# Patient Record
Sex: Female | Born: 1955
Health system: Southern US, Community
[De-identification: ages and names within clinical notes are randomized; demographics above are authoritative.]

## PROBLEM LIST (undated history)

## (undated) DIAGNOSIS — H269 Unspecified cataract: Secondary | ICD-10-CM

## (undated) DIAGNOSIS — G2581 Restless legs syndrome: Secondary | ICD-10-CM

## (undated) DIAGNOSIS — D649 Anemia, unspecified: Secondary | ICD-10-CM

## (undated) DIAGNOSIS — IMO0001 Reserved for inherently not codable concepts without codable children: Secondary | ICD-10-CM

## (undated) DIAGNOSIS — Z905 Acquired absence of kidney: Secondary | ICD-10-CM

## (undated) DIAGNOSIS — E785 Hyperlipidemia, unspecified: Secondary | ICD-10-CM

## (undated) DIAGNOSIS — T7840XA Allergy, unspecified, initial encounter: Secondary | ICD-10-CM

## (undated) HISTORY — DX: Unspecified cataract: H26.9

## (undated) HISTORY — DX: Allergy, unspecified, initial encounter: T78.40XA

## (undated) HISTORY — DX: Hyperlipidemia, unspecified: E78.5

## (undated) HISTORY — DX: Acquired absence of kidney: Z90.5

## (undated) HISTORY — DX: Reserved for inherently not codable concepts without codable children: IMO0001

## (undated) HISTORY — DX: Restless legs syndrome: G25.81

## (undated) HISTORY — DX: Anemia, unspecified: D64.9

---

## 1973-11-14 HISTORY — PX: NEPHRECTOMY: SHX65

## 2000-03-28 ENCOUNTER — Other Ambulatory Visit: Admission: RE | Admit: 2000-03-28 | Discharge: 2000-03-28 | Payer: Self-pay | Admitting: Obstetrics and Gynecology

## 2000-04-06 ENCOUNTER — Encounter (INDEPENDENT_AMBULATORY_CARE_PROVIDER_SITE_OTHER): Payer: Self-pay | Admitting: Specialist

## 2000-04-06 ENCOUNTER — Other Ambulatory Visit: Admission: RE | Admit: 2000-04-06 | Discharge: 2000-04-06 | Payer: Self-pay | Admitting: Obstetrics and Gynecology

## 2000-08-04 ENCOUNTER — Encounter: Payer: Self-pay | Admitting: Obstetrics and Gynecology

## 2000-08-04 ENCOUNTER — Encounter: Admission: RE | Admit: 2000-08-04 | Discharge: 2000-08-04 | Payer: Self-pay | Admitting: Obstetrics and Gynecology

## 2001-07-09 ENCOUNTER — Other Ambulatory Visit: Admission: RE | Admit: 2001-07-09 | Discharge: 2001-07-09 | Payer: Self-pay | Admitting: Obstetrics and Gynecology

## 2001-08-08 ENCOUNTER — Encounter: Payer: Self-pay | Admitting: Obstetrics and Gynecology

## 2001-08-08 ENCOUNTER — Encounter: Admission: RE | Admit: 2001-08-08 | Discharge: 2001-08-08 | Payer: Self-pay | Admitting: Obstetrics and Gynecology

## 2001-08-13 ENCOUNTER — Ambulatory Visit (HOSPITAL_COMMUNITY): Admission: RE | Admit: 2001-08-13 | Discharge: 2001-08-13 | Payer: Self-pay | Admitting: Gastroenterology

## 2001-08-13 ENCOUNTER — Encounter (INDEPENDENT_AMBULATORY_CARE_PROVIDER_SITE_OTHER): Payer: Self-pay | Admitting: *Deleted

## 2001-08-20 ENCOUNTER — Encounter: Payer: Self-pay | Admitting: Gastroenterology

## 2001-08-20 ENCOUNTER — Encounter: Admission: RE | Admit: 2001-08-20 | Discharge: 2001-08-20 | Payer: Self-pay | Admitting: Gastroenterology

## 2002-07-24 ENCOUNTER — Other Ambulatory Visit: Admission: RE | Admit: 2002-07-24 | Discharge: 2002-07-24 | Payer: Self-pay | Admitting: Obstetrics and Gynecology

## 2002-08-12 ENCOUNTER — Encounter: Payer: Self-pay | Admitting: Obstetrics and Gynecology

## 2002-08-12 ENCOUNTER — Encounter: Admission: RE | Admit: 2002-08-12 | Discharge: 2002-08-12 | Payer: Self-pay | Admitting: Obstetrics and Gynecology

## 2003-08-14 ENCOUNTER — Encounter: Admission: RE | Admit: 2003-08-14 | Discharge: 2003-08-14 | Payer: Self-pay | Admitting: Obstetrics and Gynecology

## 2003-08-14 ENCOUNTER — Encounter: Payer: Self-pay | Admitting: Obstetrics and Gynecology

## 2003-09-02 ENCOUNTER — Other Ambulatory Visit: Admission: RE | Admit: 2003-09-02 | Discharge: 2003-09-02 | Payer: Self-pay | Admitting: Obstetrics and Gynecology

## 2004-10-06 ENCOUNTER — Ambulatory Visit: Payer: Self-pay | Admitting: Family Medicine

## 2004-11-09 ENCOUNTER — Ambulatory Visit: Payer: Self-pay | Admitting: Family Medicine

## 2004-12-02 ENCOUNTER — Encounter: Admission: RE | Admit: 2004-12-02 | Discharge: 2004-12-02 | Payer: Self-pay | Admitting: Family Medicine

## 2004-12-03 ENCOUNTER — Other Ambulatory Visit: Admission: RE | Admit: 2004-12-03 | Discharge: 2004-12-03 | Payer: Self-pay | Admitting: Obstetrics and Gynecology

## 2006-01-09 ENCOUNTER — Ambulatory Visit: Payer: Self-pay | Admitting: Family Medicine

## 2006-01-09 ENCOUNTER — Encounter: Admission: RE | Admit: 2006-01-09 | Discharge: 2006-01-09 | Payer: Self-pay | Admitting: Family Medicine

## 2006-01-17 ENCOUNTER — Ambulatory Visit: Payer: Self-pay | Admitting: Family Medicine

## 2006-05-04 ENCOUNTER — Other Ambulatory Visit: Admission: RE | Admit: 2006-05-04 | Discharge: 2006-05-04 | Payer: Self-pay | Admitting: Obstetrics and Gynecology

## 2006-10-02 ENCOUNTER — Ambulatory Visit: Payer: Self-pay | Admitting: Family Medicine

## 2006-12-12 ENCOUNTER — Ambulatory Visit: Payer: Self-pay | Admitting: Family Medicine

## 2007-01-15 ENCOUNTER — Ambulatory Visit: Payer: Self-pay | Admitting: Family Medicine

## 2007-01-15 LAB — CONVERTED CEMR LAB
Alkaline Phosphatase: 85 units/L (ref 39–117)
BUN: 13 mg/dL (ref 6–23)
Basophils Relative: 0.6 % (ref 0.0–1.0)
Bilirubin, Direct: 0.1 mg/dL (ref 0.0–0.3)
CO2: 32 meq/L (ref 19–32)
Cholesterol: 202 mg/dL (ref 0–200)
Direct LDL: 119.2 mg/dL
GFR calc Af Amer: 85 mL/min
Glucose, Bld: 95 mg/dL (ref 70–99)
Hemoglobin: 13.4 g/dL (ref 12.0–15.0)
Lymphocytes Relative: 31.3 % (ref 12.0–46.0)
Monocytes Absolute: 0.6 10*3/uL (ref 0.2–0.7)
Monocytes Relative: 11.9 % — ABNORMAL HIGH (ref 3.0–11.0)
Neutro Abs: 2.4 10*3/uL (ref 1.4–7.7)
Potassium: 4.4 meq/L (ref 3.5–5.1)
Sodium: 141 meq/L (ref 135–145)
TSH: 1.8 microintl units/mL (ref 0.35–5.50)
Total Protein: 6.9 g/dL (ref 6.0–8.3)
VLDL: 19 mg/dL (ref 0–40)

## 2007-01-23 ENCOUNTER — Ambulatory Visit: Payer: Self-pay | Admitting: Family Medicine

## 2007-01-23 ENCOUNTER — Encounter: Admission: RE | Admit: 2007-01-23 | Discharge: 2007-01-23 | Payer: Self-pay | Admitting: Family Medicine

## 2007-09-11 DIAGNOSIS — J309 Allergic rhinitis, unspecified: Secondary | ICD-10-CM | POA: Insufficient documentation

## 2007-09-11 DIAGNOSIS — E785 Hyperlipidemia, unspecified: Secondary | ICD-10-CM

## 2007-12-31 DIAGNOSIS — J069 Acute upper respiratory infection, unspecified: Secondary | ICD-10-CM | POA: Insufficient documentation

## 2008-01-04 ENCOUNTER — Ambulatory Visit: Payer: Self-pay | Admitting: Family Medicine

## 2008-01-04 ENCOUNTER — Telehealth: Payer: Self-pay | Admitting: Family Medicine

## 2008-01-04 LAB — CONVERTED CEMR LAB: Rapid Strep: NEGATIVE

## 2008-02-14 ENCOUNTER — Ambulatory Visit: Payer: Self-pay | Admitting: Family Medicine

## 2008-02-14 ENCOUNTER — Encounter: Admission: RE | Admit: 2008-02-14 | Discharge: 2008-02-14 | Payer: Self-pay | Admitting: Obstetrics and Gynecology

## 2008-02-14 LAB — CONVERTED CEMR LAB
ALT: 22 units/L (ref 0–35)
Albumin: 3.8 g/dL (ref 3.5–5.2)
Alkaline Phosphatase: 77 units/L (ref 39–117)
BUN: 14 mg/dL (ref 6–23)
CO2: 33 meq/L — ABNORMAL HIGH (ref 19–32)
Eosinophils Relative: 3.9 % (ref 0.0–5.0)
GFR calc Af Amer: 75 mL/min
Glucose, Bld: 97 mg/dL (ref 70–99)
HCT: 40.6 % (ref 36.0–46.0)
Hemoglobin: 13.1 g/dL (ref 12.0–15.0)
Lymphocytes Relative: 32 % (ref 12.0–46.0)
Monocytes Absolute: 0.5 10*3/uL (ref 0.1–1.0)
Monocytes Relative: 10.1 % (ref 3.0–12.0)
Neutro Abs: 2.7 10*3/uL (ref 1.4–7.7)
Nitrite: NEGATIVE
Platelets: 230 10*3/uL (ref 150–400)
Potassium: 4.5 meq/L (ref 3.5–5.1)
Specific Gravity, Urine: 1.02
Total CHOL/HDL Ratio: 2.9
Total Protein: 6.9 g/dL (ref 6.0–8.3)
WBC Urine, dipstick: NEGATIVE
WBC: 5 10*3/uL (ref 4.5–10.5)

## 2008-02-28 ENCOUNTER — Ambulatory Visit: Payer: Self-pay | Admitting: Family Medicine

## 2008-08-18 ENCOUNTER — Telehealth: Payer: Self-pay | Admitting: Family Medicine

## 2009-01-05 ENCOUNTER — Telehealth: Payer: Self-pay | Admitting: Family Medicine

## 2009-02-11 ENCOUNTER — Ambulatory Visit: Payer: Self-pay | Admitting: Family Medicine

## 2009-02-11 LAB — CONVERTED CEMR LAB
ALT: 41 units/L — ABNORMAL HIGH (ref 0–35)
Albumin: 3.8 g/dL (ref 3.5–5.2)
Basophils Relative: 0.3 % (ref 0.0–3.0)
Bilirubin, Direct: 0.1 mg/dL (ref 0.0–0.3)
CO2: 35 meq/L — ABNORMAL HIGH (ref 19–32)
Chloride: 105 meq/L (ref 96–112)
Creatinine, Ser: 1 mg/dL (ref 0.4–1.2)
Eosinophils Absolute: 0.2 10*3/uL (ref 0.0–0.7)
Eosinophils Relative: 2.9 % (ref 0.0–5.0)
HCT: 40.8 % (ref 36.0–46.0)
Hemoglobin: 13.9 g/dL (ref 12.0–15.0)
LDL Cholesterol: 119 mg/dL — ABNORMAL HIGH (ref 0–99)
MCHC: 34 g/dL (ref 30.0–36.0)
MCV: 91.5 fL (ref 78.0–100.0)
Monocytes Absolute: 0.5 10*3/uL (ref 0.1–1.0)
Neutro Abs: 2.9 10*3/uL (ref 1.4–7.7)
Potassium: 4.5 meq/L (ref 3.5–5.1)
RBC: 4.46 M/uL (ref 3.87–5.11)
Sodium: 144 meq/L (ref 135–145)
Total CHOL/HDL Ratio: 3
Total Protein: 6.8 g/dL (ref 6.0–8.3)
Triglycerides: 80 mg/dL (ref 0.0–149.0)
WBC: 5.3 10*3/uL (ref 4.5–10.5)

## 2009-03-02 ENCOUNTER — Ambulatory Visit: Payer: Self-pay | Admitting: Family Medicine

## 2009-03-02 ENCOUNTER — Encounter: Admission: RE | Admit: 2009-03-02 | Discharge: 2009-03-02 | Payer: Self-pay | Admitting: Family Medicine

## 2009-04-27 ENCOUNTER — Telehealth: Payer: Self-pay | Admitting: Family Medicine

## 2009-12-31 ENCOUNTER — Telehealth: Payer: Self-pay | Admitting: Family Medicine

## 2010-03-03 ENCOUNTER — Ambulatory Visit: Payer: Self-pay | Admitting: Family Medicine

## 2010-03-03 LAB — CONVERTED CEMR LAB
ALT: 24 units/L (ref 0–35)
AST: 33 units/L (ref 0–37)
Albumin: 4.2 g/dL (ref 3.5–5.2)
Basophils Relative: 0.6 % (ref 0.0–3.0)
Bilirubin Urine: NEGATIVE
Chloride: 102 meq/L (ref 96–112)
Cholesterol: 152 mg/dL (ref 0–200)
Eosinophils Relative: 2.4 % (ref 0.0–5.0)
GFR calc non Af Amer: 54.68 mL/min (ref >60)
Glucose, Urine, Semiquant: NEGATIVE
HCT: 38.8 % (ref 36.0–46.0)
Hemoglobin: 13.1 g/dL (ref 12.0–15.0)
LDL Cholesterol: 72 mg/dL (ref 0–99)
Lymphs Abs: 1.5 10*3/uL (ref 0.7–4.0)
MCV: 91.5 fL (ref 78.0–100.0)
Monocytes Absolute: 0.6 10*3/uL (ref 0.1–1.0)
Monocytes Relative: 12.5 % — ABNORMAL HIGH (ref 3.0–12.0)
Neutro Abs: 2.3 10*3/uL (ref 1.4–7.7)
Potassium: 5 meq/L (ref 3.5–5.1)
Protein, U semiquant: NEGATIVE
Sodium: 142 meq/L (ref 135–145)
TSH: 1.4 microintl units/mL (ref 0.35–5.50)
Total Protein: 7.6 g/dL (ref 6.0–8.3)
Urobilinogen, UA: 0.2
VLDL: 9.8 mg/dL (ref 0.0–40.0)
WBC Urine, dipstick: NEGATIVE
WBC: 4.5 10*3/uL (ref 4.5–10.5)
pH: 6.5

## 2010-03-11 ENCOUNTER — Encounter: Admission: RE | Admit: 2010-03-11 | Discharge: 2010-03-11 | Payer: Self-pay | Admitting: Family Medicine

## 2010-03-26 ENCOUNTER — Ambulatory Visit: Payer: Self-pay | Admitting: Family Medicine

## 2010-12-14 NOTE — Progress Notes (Signed)
Summary: rx sent to [facility]  Phone Note From Pharmacy   Summary of Call: patient would like refills sent to [facility] Initial call taken by: Kern Reap CMA Duncan Dull),  December 31, 2009 3:56 PM    Prescriptions: CYANOCOBALAMIN 1000 MCG/ML INJ SOLN (CYANOCOBALAMIN) inject 1cc monthly IM  #30cc x 3   Entered by:   Kern Reap CMA (AAMA)   Authorized by:   Roderick Pee MD   Signed by:   Kern Reap CMA (AAMA) on 12/31/2009   Method used:   Electronically to        [facility] (retail)       [address]       [place], [place]  [id number]       Ph: [phone]       Fax: [phone]   RxID:   [id number]

## 2010-12-14 NOTE — Assessment & Plan Note (Signed)
Summary: CPX // RS   Vital Signs:  Patient profile:   55 year old female Height:      63.5 inches Weight:      136 pounds BMI:     23.80 Temp:     97.6 degrees F oral BP sitting:   110 / 80  (right arm) CC: Cpx   CC:  Cpx.  History of Present Illness: Brooke Hansen is a 55 year old, married female, nonsmoker, who comes in today for general physical exam and evaluation of allergic rhinitis.  Vitamin B12 deficiency, and hyperlipidemia.  Her allergic rhinitis is treated with Flonase nasal spray and 10 mg of Zyrtec nightly on the prematurely.  Basis.  Hyperlipidemia.  Is treated with Zocor 40 mg nightly lipids are at goal.  She has vitamin B12 deficiency and takes 1 cc of B12 monthly.  New problems are some spots on her hands and tennis elbow.  She does routine eye exams, dental, BSE monthly, annual mammography, colonoscopy, normal about 8 years ago in GI, have the GYN, tetanus, 2004, seasonal flu 2010.  she rarely has to use the albuterol  Allergies: 1)  ! * Cillins 2)  Amoxicillin (Amoxicillin) 3)  * Ceptra  Past History:  Past medical, surgical, family and social histories (including risk factors) reviewed, and no changes noted (except as noted below).  Past Medical History: Reviewed history from 02/28/2008 and no changes required. RLS Allergic rhinitis Anemia-NOS Asthma Blood transfusion Hyperlipidemia childbirth x 1 C-section right nephrectomy for nonfunctioning kidney  Past Surgical History: Reviewed history from 09/11/2007 and no changes required. Caesarean section Nephrectomy  Family History: Reviewed history from 09/11/2007 and no changes required. Family History of Alcoholism/Addiction Family History Diabetes 1st degree relative Family History High cholesterol Family History of Cardiovascular disorder  Social History: Reviewed history from 01/04/2008 and no changes required. Occupation:school system Married Former Smoker Alcohol use-yes Drug  use-no Regular exercise-no  Review of Systems      See HPI  Physical Exam  General:  Well-developed,well-nourished,in no acute distress; alert,appropriate and cooperative throughout examination Head:  Normocephalic and atraumatic without obvious abnormalities. No apparent alopecia or balding. Eyes:  No corneal or conjunctival inflammation noted. EOMI. Perrla. Funduscopic exam benign, without hemorrhages, exudates or papilledema. Vision grossly normal. Ears:  External ear exam shows no significant lesions or deformities.  Otoscopic examination reveals clear canals, tympanic membranes are intact bilaterally without bulging, retraction, inflammation or discharge. Hearing is grossly normal bilaterally. Nose:  External nasal examination shows no deformity or inflammation. Nasal mucosa are pink and moist without lesions or exudates. Mouth:  Oral mucosa and oropharynx without lesions or exudates.  Teeth in good repair. Neck:  No deformities, masses, or tenderness noted. Chest Wall:  is some asymmetry with the left ribs, being more prominent than the right Breasts:  No mass, nodules, thickening, tenderness, bulging, retraction, inflamation, nipple discharge or skin changes noted.   Lungs:  Normal respiratory effort, chest expands symmetrically. Lungs are clear to auscultation, no crackles or wheezes. Heart:  Normal rate and regular rhythm. S1 and S2 normal without gallop, murmur, click, rub or other extra sounds. Abdomen:  Bowel sounds positive,abdomen soft and non-tender without masses, organomegaly or hernias noted. Msk:  No deformity or scoliosis noted of thoracic or lumbar spine.   Pulses:  R and L carotid,radial,femoral,dorsalis pedis and posterior tibial pulses are full and equal bilaterally Extremities:  No clubbing, cyanosis, edema, or deformity noted with normal full range of motion of all joints.   Neurologic:  No  cranial nerve deficits noted. Station and gait are normal. Plantar reflexes  are down-going bilaterally. DTRs are symmetrical throughout. Sensory, motor and coordinative functions appear intact. Skin:  Intact without suspicious lesions or rashes Cervical Nodes:  No lymphadenopathy noted Axillary Nodes:  No palpable lymphadenopathy Inguinal Nodes:  No significant adenopathy Psych:  Cognition and judgment appear intact. Alert and cooperative with normal attention span and concentration. No apparent delusions, illusions, hallucinations   Impression & Recommendations:  Problem # 1:  PHYSICAL EXAMINATION (ICD-V70.0) Assessment Unchanged  Orders: Prescription Created Electronically [phone]) EKG w/ Interpretation (93000)  Problem # 2:  HYPERLIPIDEMIA (ICD-272.4) Assessment: Improved  Her updated medication list for this problem includes:    Zocor 40 Mg Tabs (Simvastatin) .Marland Kitchen... 1 tab @ bedtime  Orders: Prescription Created Electronically [phone])  Problem # 3:  ASTHMA (ICD-493.90) Assessment: Improved  The following medications were removed from the medication list:    Albuterol 90 Mcg/act Aers (Albuterol) .Marland Kitchen... As needed Her updated medication list for this problem includes:    Proventil Hfa 108 (90 Base) Mcg/act Aers (Albuterol sulfate) ..... Inhale 2 puff as directed four times a day as needed  Orders: Prescription Created Electronically [phone])  Problem # 4:  ANEMIA-NOS (ICD-285.9) Assessment: Improved  Her updated medication list for this problem includes:    Cyanocobalamin 1000 Mcg/ml Inj Soln (Cyanocobalamin) ..... Inject 1cc monthly im  Orders: Prescription Created Electronically [phone])  Complete Medication List: 1)  Flonase 50 Mcg/act Susp (Fluticasone propionate) .... Spray 1 spray into both nostrils once  a day 2)  Proventil Hfa 108 (90 Base) Mcg/act Aers (Albuterol sulfate) .... Inhale 2 puff as directed four times a day as needed 3)  Cyanocobalamin 1000 Mcg/ml Inj Soln (Cyanocobalamin) .... Inject 1cc monthly im 4)  Zocor 40 Mg Tabs  (Simvastatin) .Marland Kitchen.. 1 tab @ bedtime  Patient Instructions: 1)  Please schedule a follow-up appointment in 1 year. 2)  Schedule your mammogram. 3)  Schedule a colonoscopy/sigmoidoscopy to help detect colon cancer. 4)  Take calcium +Vitamin D daily. 5)  Take an Aspirin every day. Prescriptions: ZOCOR 40 MG  TABS (SIMVASTATIN) 1 tab @ bedtime  #100 x 3   Entered and Authorized by:   Roderick Pee MD   Signed by:   Roderick Pee MD on 03/26/2010   Method used:   Electronically to        [facility] (retail)       [address]       [place], [place]  [address]       Ph: [phone]       Fax: [phone]   RxID:   [id number] CYANOCOBALAMIN 1000 MCG/ML INJ SOLN (CYANOCOBALAMIN) inject 1cc monthly IM  #30cc x 3   Entered and Authorized by:   Roderick Pee MD   Signed by:   Roderick Pee MD on 03/26/2010   Method used:   Electronically to        [facility] (retail)       [address]       [place], [place]  [address]       Ph: [phone]       Fax: [phone]   RxID:   [id number] PROVENTIL HFA 108 (90 BASE) MCG/ACT AERS (ALBUTEROL SULFATE) Inhale 2 puff as directed four times a day as needed  #2 x 6   Entered and Authorized by:   Roderick Pee MD   Signed by:   Eugenio Hoes  Kelle Ruppert MD on 03/26/2010   Method used:   Electronically to        [facility]* (retail)       [address]       [place], [place]  [address]       Ph: [phone]       Fax: [phone]   RxID:   [id number] FLONASE 50 MCG/ACT SUSP (FLUTICASONE PROPIONATE) Spray 1 spray into both nostrils once  a day  #3 x 4   Entered and Authorized by:   Roderick Pee MD   Signed by:   Roderick Pee MD on 03/26/2010   Method used:   Electronically to        [facility] (retail)       [address]       [place], [place]  [address]       Ph: [phone]       Fax: [phone]   RxID:    [id number]

## 2011-02-24 ENCOUNTER — Other Ambulatory Visit: Payer: Self-pay | Admitting: Family Medicine

## 2011-02-24 DIAGNOSIS — Z1231 Encounter for screening mammogram for malignant neoplasm of breast: Secondary | ICD-10-CM

## 2011-03-11 ENCOUNTER — Other Ambulatory Visit (INDEPENDENT_AMBULATORY_CARE_PROVIDER_SITE_OTHER): Payer: PRIVATE HEALTH INSURANCE | Admitting: Family Medicine

## 2011-03-11 DIAGNOSIS — Z0000 Encounter for general adult medical examination without abnormal findings: Secondary | ICD-10-CM

## 2011-03-11 LAB — HEPATIC FUNCTION PANEL
Alkaline Phosphatase: 76 U/L (ref 39–117)
Bilirubin, Direct: 0 mg/dL (ref 0.0–0.3)
Total Protein: 6.8 g/dL (ref 6.0–8.3)

## 2011-03-11 LAB — LIPID PANEL
HDL: 69.9 mg/dL (ref >39.00)
Total CHOL/HDL Ratio: 3
VLDL: 7 mg/dL (ref 0.0–40.0)

## 2011-03-11 LAB — POCT URINALYSIS DIPSTICK
Nitrite, UA: NEGATIVE
Protein, UA: NEGATIVE
pH, UA: 7

## 2011-03-11 LAB — BASIC METABOLIC PANEL
CO2: 30 mEq/L (ref 19–32)
Calcium: 9.9 mg/dL (ref 8.4–10.5)
Creatinine, Ser: 0.8 mg/dL (ref 0.4–1.2)
GFR: 74.36 mL/min (ref >60.00)

## 2011-03-11 LAB — CBC WITH DIFFERENTIAL/PLATELET
Basophils Absolute: 0 10*3/uL (ref 0.0–0.1)
Basophils Relative: 0.5 % (ref 0.0–3.0)
Eosinophils Absolute: 0.1 10*3/uL (ref 0.0–0.7)
Lymphocytes Relative: 26.9 % (ref 12.0–46.0)
MCHC: 33.5 g/dL (ref 30.0–36.0)
Monocytes Relative: 10.2 % (ref 3.0–12.0)
Neutrophils Relative %: 60.7 % (ref 43.0–77.0)
RBC: 4.22 Mil/uL (ref 3.87–5.11)
WBC: 5.7 10*3/uL (ref 4.5–10.5)

## 2011-03-29 ENCOUNTER — Encounter: Payer: Self-pay | Admitting: Family Medicine

## 2011-04-04 ENCOUNTER — Ambulatory Visit: Payer: Self-pay

## 2011-04-06 ENCOUNTER — Encounter: Payer: Self-pay | Admitting: Family Medicine

## 2011-04-07 ENCOUNTER — Encounter: Payer: Self-pay | Admitting: Family Medicine

## 2011-04-07 ENCOUNTER — Ambulatory Visit
Admission: RE | Admit: 2011-04-07 | Discharge: 2011-04-07 | Disposition: A | Discharge status: Home / Self Care | Payer: PRIVATE HEALTH INSURANCE | Source: Ambulatory Visit | Attending: Family Medicine | Admitting: Family Medicine

## 2011-04-07 ENCOUNTER — Ambulatory Visit (INDEPENDENT_AMBULATORY_CARE_PROVIDER_SITE_OTHER): Payer: BC Managed Care – PPO | Admitting: Family Medicine

## 2011-04-07 VITALS — BP 110/74 | Temp 98.2°F | Ht 63.75 in | Wt 138.0 lb

## 2011-04-07 DIAGNOSIS — E785 Hyperlipidemia, unspecified: Secondary | ICD-10-CM

## 2011-04-07 DIAGNOSIS — F411 Generalized anxiety disorder: Secondary | ICD-10-CM

## 2011-04-07 DIAGNOSIS — F419 Anxiety disorder, unspecified: Secondary | ICD-10-CM

## 2011-04-07 DIAGNOSIS — J45909 Unspecified asthma, uncomplicated: Secondary | ICD-10-CM

## 2011-04-07 DIAGNOSIS — Z1231 Encounter for screening mammogram for malignant neoplasm of breast: Secondary | ICD-10-CM

## 2011-04-07 DIAGNOSIS — J309 Allergic rhinitis, unspecified: Secondary | ICD-10-CM

## 2011-04-07 DIAGNOSIS — N952 Postmenopausal atrophic vaginitis: Secondary | ICD-10-CM | POA: Insufficient documentation

## 2011-04-07 DIAGNOSIS — D510 Vitamin B12 deficiency anemia due to intrinsic factor deficiency: Secondary | ICD-10-CM

## 2011-04-07 MED ORDER — LORAZEPAM 0.5 MG PO TABS: ORAL_TABLET | ORAL | Status: AC

## 2011-04-07 MED ORDER — SIMVASTATIN 40 MG PO TABS: 40.0000 mg | ORAL_TABLET | Freq: Every day | ORAL | Status: DC

## 2011-04-07 MED ORDER — FLUTICASONE PROPIONATE 50 MCG/ACT NA SUSP: 2.0000 | Freq: Every day | NASAL | Status: DC

## 2011-04-07 MED ORDER — PREDNISONE 20 MG PO TABS: ORAL_TABLET | ORAL | Status: DC

## 2011-04-07 MED ORDER — CYANOCOBALAMIN 1000 MCG/ML IJ SOLN: 1000.0000 ug | INTRAMUSCULAR | Status: DC

## 2011-04-07 MED ORDER — ESTROGENS, CONJUGATED 0.625 MG/GM VA CREA: TOPICAL_CREAM | VAGINAL | Status: DC

## 2011-04-07 MED ORDER — ALBUTEROL SULFATE HFA 108 (90 BASE) MCG/ACT IN AERS: 2.0000 | INHALATION_SPRAY | Freq: Four times a day (QID) | RESPIRATORY_TRACT | Status: DC | PRN

## 2011-04-07 NOTE — Patient Instructions (Signed)
Continue your current medications.  Take the prednisone as directed for the contact dermatitis.  Use small amounts of the Premarin vaginal cream twice weekly.  Ativan .5, 45 minutes prior to the airplane trip.  Follow-up in one year, sooner if any problems

## 2011-04-07 NOTE — Progress Notes (Signed)
  Subjective:    Patient ID: Brooke Hansen, female    DOB: 1956-10-31, 55 y.o.   MRN: [id number]  HPI   Brooke Hansen is a 55 year old single female, nonsmoker, who comes in today for evaluation of allergic rhinitis, asthma, vitamin B12 deficiency, hyperlipidemia, and a new problem, postmenopausal vaginal dryness.  Contact dermatitis, and anxiety with flying.  She uses albuterol p.r.n. For asthma typically flares up, and she has a viral syndrome.  She takes 1000 cc of vitamin B12 monthly for B12 deficiency.  She is a steroid nasal spray for allergic rhinitis.  She takes 40 mg of Zocor bedtime for hyperlipidemia.  Cannot tolerate aspirin.  It made her bruise too much.  She recently developed a contact dermatitis now involves her neck, arms, legs, and groin.  She is having trouble with urinary frequency and vaginal dryness.  She also gets anxious when she flies.  Once the nodes are seen in she can take.  She gets routine eye care, dental care, BSE monthly, and he mammography, colonoscopy, normal, Pap May 2011, normal    Review of Systems  Constitutional: Negative.   HENT: Negative.   Eyes: Negative.   Respiratory: Negative.   Cardiovascular: Negative.   Gastrointestinal: Negative.   Genitourinary: Negative.   Musculoskeletal: Negative.   Neurological: Negative.   Hematological: Negative.   Psychiatric/Behavioral: Negative.        Objective:   Physical Exam  Constitutional: She appears well-developed and well-nourished.  HENT:  Head: Normocephalic and atraumatic.  Right Ear: External ear normal.  Left Ear: External ear normal.  Nose: Nose normal.  Mouth/Throat: Oropharynx is clear and moist.  Eyes: EOM are normal. Pupils are equal, round, and reactive to light.  Neck: Normal range of motion. Neck supple. No thyromegaly present.  Cardiovascular: Normal rate, regular rhythm, normal heart sounds and intact distal pulses.  Exam reveals no gallop and no friction rub.   No murmur  heard. Pulmonary/Chest: Effort normal and breath sounds normal.  Abdominal: Soft. Bowel sounds are normal. She exhibits no distension and no mass. There is no tenderness. There is no rebound.  Genitourinary: Vagina normal and uterus normal. Guaiac negative stool. No vaginal discharge found.       Bilateral breast exam normal.  Vagina is very dry, red and irritated, especially around the urethra  Musculoskeletal: Normal range of motion.  Lymphadenopathy:    She has no cervical adenopathy.  Neurological: She is alert. She has normal reflexes. No cranial nerve deficit. She exhibits normal muscle tone. Coordination normal.  Skin: Skin is warm and dry.  Psychiatric: She has a normal mood and affect. Her behavior is normal. Judgment and thought content normal.          Assessment & Plan:  Allergic rhinitis.  Continue steroid nasal spray.  Intermittent asthma.  Proventil p.r.n.  B12 deficiency.  Continue vitamin B12 injections monthly.  Hyperlipidemia.  Continue Zocor 40 mg nightly  Contact dermatitis, prednisone burst and taper.  Postmenopausal vaginal dryness.  Begin Premarin vaginal cream twice weekly.  Fear of flying ,,,,, Ativan, .5, p.r.n.

## 2011-05-26 ENCOUNTER — Telehealth: Payer: Self-pay | Admitting: Family Medicine

## 2011-05-26 DIAGNOSIS — N952 Postmenopausal atrophic vaginitis: Secondary | ICD-10-CM

## 2011-05-26 DIAGNOSIS — J45909 Unspecified asthma, uncomplicated: Secondary | ICD-10-CM

## 2011-05-26 DIAGNOSIS — J309 Allergic rhinitis, unspecified: Secondary | ICD-10-CM

## 2011-05-26 MED ORDER — ALBUTEROL SULFATE HFA 108 (90 BASE) MCG/ACT IN AERS: 2.0000 | INHALATION_SPRAY | Freq: Four times a day (QID) | RESPIRATORY_TRACT | Status: DC | PRN

## 2011-05-26 MED ORDER — ESTROGENS, CONJUGATED 0.625 MG/GM VA CREA: TOPICAL_CREAM | VAGINAL | Status: DC

## 2011-05-26 NOTE — Telephone Encounter (Signed)
Pt lost her written rxs for her estrogen vaginal cream and Albuterol. She would like to pickup another copy of them.

## 2011-05-26 NOTE — Telephone Encounter (Signed)
ok 

## 2011-05-26 NOTE — Telephone Encounter (Signed)
rx ready for pick up. Left message on machine for patient. 

## 2012-04-30 ENCOUNTER — Other Ambulatory Visit: Payer: Self-pay | Admitting: Family Medicine

## 2012-04-30 DIAGNOSIS — Z1231 Encounter for screening mammogram for malignant neoplasm of breast: Secondary | ICD-10-CM

## 2012-05-03 ENCOUNTER — Other Ambulatory Visit: Payer: Self-pay | Admitting: *Deleted

## 2012-05-03 DIAGNOSIS — E785 Hyperlipidemia, unspecified: Secondary | ICD-10-CM

## 2012-05-03 DIAGNOSIS — J309 Allergic rhinitis, unspecified: Secondary | ICD-10-CM

## 2012-05-03 MED ORDER — SIMVASTATIN 40 MG PO TABS: 40.0000 mg | ORAL_TABLET | Freq: Every day | ORAL | Status: DC

## 2012-05-03 MED ORDER — FLUTICASONE PROPIONATE 50 MCG/ACT NA SUSP: 2.0000 | Freq: Every day | NASAL | Status: DC

## 2012-05-03 NOTE — Telephone Encounter (Signed)
Patient switched pharmacy

## 2012-05-14 ENCOUNTER — Ambulatory Visit: Payer: BC Managed Care – PPO

## 2012-05-29 ENCOUNTER — Ambulatory Visit
Admission: RE | Admit: 2012-05-29 | Discharge: 2012-05-29 | Disposition: A | Discharge status: Home / Self Care | Payer: BC Managed Care – PPO | Source: Ambulatory Visit | Attending: Family Medicine | Admitting: Family Medicine

## 2012-05-29 DIAGNOSIS — Z1231 Encounter for screening mammogram for malignant neoplasm of breast: Secondary | ICD-10-CM

## 2012-07-18 ENCOUNTER — Other Ambulatory Visit (INDEPENDENT_AMBULATORY_CARE_PROVIDER_SITE_OTHER): Payer: BC Managed Care – PPO

## 2012-07-18 DIAGNOSIS — Z0000 Encounter for general adult medical examination without abnormal findings: Secondary | ICD-10-CM

## 2012-07-18 LAB — POCT URINALYSIS DIPSTICK
Bilirubin, UA: NEGATIVE
Blood, UA: NEGATIVE
Glucose, UA: NEGATIVE
Ketones, UA: NEGATIVE
Spec Grav, UA: 1.015

## 2012-07-18 LAB — CBC WITH DIFFERENTIAL/PLATELET
Basophils Absolute: 0 10*3/uL (ref 0.0–0.1)
Lymphocytes Relative: 27.4 % (ref 12.0–46.0)
Lymphs Abs: 1.5 10*3/uL (ref 0.7–4.0)
Monocytes Relative: 11.2 % (ref 3.0–12.0)
Platelets: 202 10*3/uL (ref 150.0–400.0)
RDW: 15 % — ABNORMAL HIGH (ref 11.5–14.6)

## 2012-07-18 LAB — BASIC METABOLIC PANEL
BUN: 19 mg/dL (ref 6–23)
Calcium: 9.5 mg/dL (ref 8.4–10.5)
Creatinine, Ser: 0.9 mg/dL (ref 0.4–1.2)
GFR: 67.48 mL/min (ref >60.00)
Glucose, Bld: 96 mg/dL (ref 70–99)

## 2012-07-18 LAB — HEPATIC FUNCTION PANEL
Albumin: 3.7 g/dL (ref 3.5–5.2)
Total Protein: 6.5 g/dL (ref 6.0–8.3)

## 2012-07-18 LAB — LIPID PANEL
Cholesterol: 197 mg/dL (ref 0–200)
Triglycerides: 72 mg/dL (ref 0.0–149.0)

## 2012-08-01 ENCOUNTER — Other Ambulatory Visit (HOSPITAL_COMMUNITY)
Admission: RE | Admit: 2012-08-01 | Discharge: 2012-08-01 | Disposition: A | Discharge status: Home / Self Care | Payer: BC Managed Care – PPO | Source: Ambulatory Visit | Attending: Family Medicine | Admitting: Family Medicine

## 2012-08-01 ENCOUNTER — Ambulatory Visit (INDEPENDENT_AMBULATORY_CARE_PROVIDER_SITE_OTHER): Payer: BC Managed Care – PPO | Admitting: Family Medicine

## 2012-08-01 ENCOUNTER — Encounter: Payer: Self-pay | Admitting: Family Medicine

## 2012-08-01 VITALS — BP 102/70 | Temp 98.3°F | Ht 63.5 in | Wt 141.0 lb

## 2012-08-01 DIAGNOSIS — J309 Allergic rhinitis, unspecified: Secondary | ICD-10-CM

## 2012-08-01 DIAGNOSIS — D510 Vitamin B12 deficiency anemia due to intrinsic factor deficiency: Secondary | ICD-10-CM

## 2012-08-01 DIAGNOSIS — N952 Postmenopausal atrophic vaginitis: Secondary | ICD-10-CM

## 2012-08-01 DIAGNOSIS — E785 Hyperlipidemia, unspecified: Secondary | ICD-10-CM

## 2012-08-01 DIAGNOSIS — Z01419 Encounter for gynecological examination (general) (routine) without abnormal findings: Secondary | ICD-10-CM | POA: Insufficient documentation

## 2012-08-01 DIAGNOSIS — J45909 Unspecified asthma, uncomplicated: Secondary | ICD-10-CM

## 2012-08-01 MED ORDER — CYANOCOBALAMIN 1000 MCG/ML IJ SOLN: 1000.0000 ug | INTRAMUSCULAR | Status: DC

## 2012-08-01 MED ORDER — FLUTICASONE PROPIONATE 50 MCG/ACT NA SUSP: 2.0000 | Freq: Every day | NASAL | Status: DC

## 2012-08-01 MED ORDER — ESTROGENS, CONJUGATED 0.625 MG/GM VA CREA: TOPICAL_CREAM | Freq: Two times a day (BID) | VAGINAL | Status: DC

## 2012-08-01 MED ORDER — SIMVASTATIN 40 MG PO TABS: 40.0000 mg | ORAL_TABLET | Freq: Every day | ORAL | Status: DC

## 2012-08-01 NOTE — Patient Instructions (Signed)
Continue current medications  You small amounts of a hormonal cream 3 times weekly for 3 months then twice weekly  Return in one year sooner if any problem remember to do a thorough breast exam monthly

## 2012-08-01 NOTE — Progress Notes (Signed)
  Subjective:    Patient ID: Brooke Hansen, female    DOB: 06-23-1956, 56 y.o.   MRN: [id number]  HPI Brooke Hansen is a 56 year old married female nonsmoker who comes in today for general physical examination  She uses Premarin vaginal cream for vaginal dryness but only once monthly. 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   HENT: Negative.   Eyes: Negative.   Respiratory: Negative.   Cardiovascular: Negative.   Gastrointestinal: Negative.   Genitourinary: Negative.   Musculoskeletal: Negative.   Neurological: Negative.   Hematological: Negative.   Psychiatric/Behavioral: Negative.        Objective:   Physical Exam  Constitutional: She is oriented to person, place, and time. She appears well-developed and well-nourished. No distress.  HENT:  Head: Normocephalic and atraumatic.  Right Ear: External ear normal.  Left Ear: External ear normal.  Nose: Nose normal.  Mouth/Throat: Oropharynx is clear and moist.  Eyes: Conjunctivae normal and EOM are normal. Pupils are equal, round, and reactive to light. Right eye exhibits no discharge. Left eye exhibits no discharge. No scleral icterus.  Neck: Normal range of motion. Neck supple. No JVD present. No tracheal deviation present. No thyromegaly present.  Cardiovascular: Normal rate, regular rhythm, normal heart sounds and intact distal pulses.  Exam reveals no gallop and no friction rub.   No murmur heard. Pulmonary/Chest: Effort normal and breath sounds normal. No stridor. No respiratory distress. She  has no wheezes. She has no rales. She exhibits no tenderness.  Abdominal: Soft. Bowel sounds are normal. She exhibits no distension and no mass. There is no tenderness. There is no rebound.  Genitourinary: Vagina normal and uterus normal. Guaiac negative stool. No vaginal discharge found.  Musculoskeletal: Normal range of motion. She exhibits no edema and no tenderness.  Lymphadenopathy:    She has no cervical adenopathy.  Neurological: She is alert and oriented to person, place, and time. She has normal reflexes. She displays normal reflexes. No cranial nerve deficit. She exhibits normal muscle tone. Coordination normal.  Skin: Skin is warm. No rash noted. She is not diaphoretic. No erythema. No pallor.       Total body skin exam normal she has numerous freckles moles capillary hemangiomatous birthmark on her left posterior scapula and left posterior thigh. She also has a brown freckle right labia no dark pigment  Psychiatric: She has a normal mood and affect. Her behavior is normal. Judgment and thought content normal.          Assessment & Plan:  Healthy female  History of occasional asthma associated with a viral infection and albuterol when necessary  Vaginal dryness use the Premarin cream twice weekly  History of B12 deficiency continue vitamin B12 1 cc monthly at home  Allergic rhinitis continue steroid nasal spray  Hyperlipidemia continue Zocor 40 mg daily  Return in one year sooner if any problems

## 2012-10-12 ENCOUNTER — Ambulatory Visit (INDEPENDENT_AMBULATORY_CARE_PROVIDER_SITE_OTHER): Payer: BC Managed Care – PPO | Admitting: Family Medicine

## 2012-10-12 ENCOUNTER — Encounter: Payer: Self-pay | Admitting: Family Medicine

## 2012-10-12 VITALS — BP 110/70 | Temp 98.6°F | Wt 136.0 lb

## 2012-10-12 DIAGNOSIS — J069 Acute upper respiratory infection, unspecified: Secondary | ICD-10-CM

## 2012-10-12 DIAGNOSIS — J309 Allergic rhinitis, unspecified: Secondary | ICD-10-CM

## 2012-10-12 DIAGNOSIS — J45909 Unspecified asthma, uncomplicated: Secondary | ICD-10-CM

## 2012-10-12 MED ORDER — ALBUTEROL SULFATE HFA 108 (90 BASE) MCG/ACT IN AERS: 2.0000 | INHALATION_SPRAY | Freq: Four times a day (QID) | RESPIRATORY_TRACT | Status: DC | PRN

## 2012-10-12 MED ORDER — HYDROCODONE-HOMATROPINE 5-1.5 MG/5ML PO SYRP: 5.0000 mL | ORAL_SOLUTION | Freq: Three times a day (TID) | ORAL | Status: DC | PRN

## 2012-10-12 NOTE — Progress Notes (Signed)
  Subjective:    Patient ID: Brooke Hansen, female    DOB: [birth year], 56 y.o.   MRN: [id number]  HPI Brooke Hansen is a 56 year old female nonsmoker who comes in today with a week's history of head congestion sore throat and cough  Her major concern is not herself but her father. She states her dad has lung cancer and is treated at the cancer center here. 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

## 2012-10-12 NOTE — Patient Instructions (Addendum)
Drink lots of water   afrin nasal spray ,,,,,,,,,,, one-shot each nostril at bedtime for 5 nights then stop  Hydromet 1/2-1 teaspoon up to 3 times daily when necessary for cough

## 2012-12-29 ENCOUNTER — Other Ambulatory Visit: Payer: Self-pay

## 2013-04-15 ENCOUNTER — Encounter: Payer: Self-pay | Admitting: Family Medicine

## 2013-04-15 ENCOUNTER — Ambulatory Visit (INDEPENDENT_AMBULATORY_CARE_PROVIDER_SITE_OTHER): Payer: BC Managed Care – PPO | Admitting: Family Medicine

## 2013-04-15 VITALS — BP 110/74 | Temp 99.4°F | Wt 135.0 lb

## 2013-04-15 DIAGNOSIS — J309 Allergic rhinitis, unspecified: Secondary | ICD-10-CM

## 2013-04-15 DIAGNOSIS — J45909 Unspecified asthma, uncomplicated: Secondary | ICD-10-CM

## 2013-04-15 MED ORDER — PREDNISONE 20 MG PO TABS: ORAL_TABLET | ORAL | Status: AC

## 2013-04-15 NOTE — Progress Notes (Signed)
  Subjective:    Patient ID: Brooke Hansen, female    DOB: January 16, 1956, 58 y.o.   MRN: [id number]  HPI Vanisha is a 57 year old married female nonsmoker who comes in with a six-day history of cough  She has a history of underlying allergic rhinitis and asthma. She's been using albuterol. She did a lot of yard work the last couple weeks. The albuterol seems to help but it doesn't stop her wheezing.   Review of Systems    review of systems otherwise negative Objective:   Physical Exam Well-developed well-nourished female no acute distress HEENT negative neck was supple no adenopathy lungs are clear except for symmetrical late expiratory wheezing. Cardiac exam normal. Left axillary line.............Marland Kitchen Dark lesion advised to return for removal ASAP       Assessment & Plan:  Asthma prednisone burst and taper  Abnormal freckle return sometime in the next couple weeks for removal

## 2013-04-15 NOTE — Patient Instructions (Signed)
Prednisone 20 mg,,,,,,,,, 2 tabs x3 days then taper slowly  Set up a 30 minute appointment sometime in the next couple weeks for removal of the lesion on your left lateral chest wall

## 2013-04-25 ENCOUNTER — Ambulatory Visit (INDEPENDENT_AMBULATORY_CARE_PROVIDER_SITE_OTHER): Payer: BC Managed Care – PPO | Admitting: Family Medicine

## 2013-04-25 ENCOUNTER — Encounter: Payer: Self-pay | Admitting: Family Medicine

## 2013-04-25 DIAGNOSIS — J189 Pneumonia, unspecified organism: Secondary | ICD-10-CM | POA: Insufficient documentation

## 2013-04-25 DIAGNOSIS — D235 Other benign neoplasm of skin of trunk: Secondary | ICD-10-CM | POA: Insufficient documentation

## 2013-04-25 DIAGNOSIS — J309 Allergic rhinitis, unspecified: Secondary | ICD-10-CM

## 2013-04-25 DIAGNOSIS — J45909 Unspecified asthma, uncomplicated: Secondary | ICD-10-CM

## 2013-04-25 MED ORDER — DOXYCYCLINE HYCLATE 100 MG PO TABS: 100.0000 mg | ORAL_TABLET | Freq: Two times a day (BID) | ORAL | Status: DC

## 2013-04-25 NOTE — Addendum Note (Signed)
Addended by: Kern Reap B on: 04/25/2013 02:23 PM   Modules accepted: Orders

## 2013-04-25 NOTE — Patient Instructions (Signed)
Increase the prednisone to one tablet daily x5 days, a half a tablet a day for 5 days, then a half a tablet Monday Wednesday Friday for a two-week taper  Begin the antibiotic one twice daily  Return in 10 days for followup

## 2013-04-25 NOTE — Progress Notes (Signed)
  Subjective:    Patient ID: Brooke Hansen, female    DOB: [birth year], 57 y.o.   MRN: [id number]  HPI  Brooke Hansen is a 57 year old female who comes in today for evaluation of 2 problems  We saw her about 2 weeks ago with a flareup of her asthma. We started on a burst and taper of prednisone. She's down to a half a tablet a day however she says and the last couple days she's starting to feel worse coughing more and now bringing up discolored sputum. No fever chills  When we did her physical examination couple weeks ago I noticed a dark lesion on her left flank and she's here today to remove that lesion.  It measures about 6 mm x6 mm  After informed consent the lesion was anesthetized with 1% Xylocaine with epinephrine. It was excised with 3 mm margins and sent for pathologic analysis. The base was cauterized Band-Aids was applied she tolerated the procedure well no complications  Review of Systems Review of systems otherwise negative    Objective:   Physical Exam Procedure see above  Lung exam shows crackles right base consistent with a right lower lobe pneumonia       Assessment & Plan:  The lesion appears to be a dysplastic nevi sent for pathologic analysis to rule out skin cancer  Asthma worsening symptoms,,,,,,,, increase prednisone to one tablet daily  , Right lower lobe pneumonia,,,,,,,,,,,,,, doxycycline 100 twice a day followup in 10 days

## 2013-05-02 ENCOUNTER — Other Ambulatory Visit: Payer: Self-pay | Admitting: *Deleted

## 2013-05-02 DIAGNOSIS — J069 Acute upper respiratory infection, unspecified: Secondary | ICD-10-CM

## 2013-05-02 MED ORDER — HYDROCODONE-HOMATROPINE 5-1.5 MG/5ML PO SYRP: 5.0000 mL | ORAL_SOLUTION | Freq: Every evening | ORAL | Status: DC | PRN

## 2013-05-06 ENCOUNTER — Ambulatory Visit (INDEPENDENT_AMBULATORY_CARE_PROVIDER_SITE_OTHER): Payer: BC Managed Care – PPO | Admitting: Family Medicine

## 2013-05-06 ENCOUNTER — Encounter: Payer: Self-pay | Admitting: Family Medicine

## 2013-05-06 VITALS — BP 110/70 | Temp 98.6°F | Wt 136.0 lb

## 2013-05-06 DIAGNOSIS — J189 Pneumonia, unspecified organism: Secondary | ICD-10-CM

## 2013-05-06 DIAGNOSIS — E785 Hyperlipidemia, unspecified: Secondary | ICD-10-CM

## 2013-05-06 NOTE — Progress Notes (Signed)
  Subjective:    Patient ID: Brooke Hansen, female    DOB: 06-Nov-1956, 57 y.o.   MRN: [id number]  HPI Brooke Hansen is a 57 year old female who comes back today for followup of pneumonia  We had seen her about 3-4 weeks ago with a flare over asthma she was tapering her prednisone and then developed some fever chills and cough. On reexamination she had some crackles right lower lobe consistent with a right lower lobe pneumonia. We started on doxycycline 100 mg twice a day and went back up on her prednisone she is now down to half a tablet Monday Wednesday Friday the prednisone and states she feels about 75% better. The fever chills cough and discolored sputum are gone   Review of Systems Review of systems negative    Objective:   Physical Exam Well-developed and nourished female no acute distress pulmonary exam normal except for some very minimal late expiratory wheezing on forced expiration crackles in right lung field of gone       Assessment & Plan:  Right lower lobe pneumonia resolved  Asthma resolved taper prednisone as outlined  History of dysplastic nevus followup CPX in September

## 2013-05-06 NOTE — Patient Instructions (Signed)
Prednisone half a tablet Wednesday,,,,,, Friday,,,,,,,,,, skip the weekend,,,,,,,,,,, then a half a tablet Monday Wednesday Friday next week then stop  Set up a time for your annual physical examination in September

## 2013-05-22 ENCOUNTER — Telehealth: Payer: Self-pay | Admitting: Family Medicine

## 2013-05-22 ENCOUNTER — Ambulatory Visit (INDEPENDENT_AMBULATORY_CARE_PROVIDER_SITE_OTHER): Payer: BC Managed Care – PPO | Admitting: Family Medicine

## 2013-05-22 ENCOUNTER — Encounter: Payer: Self-pay | Admitting: Family Medicine

## 2013-05-22 VITALS — BP 102/64 | HR 65 | Temp 98.6°F | Wt 138.0 lb

## 2013-05-22 DIAGNOSIS — N952 Postmenopausal atrophic vaginitis: Secondary | ICD-10-CM

## 2013-05-22 DIAGNOSIS — R079 Chest pain, unspecified: Secondary | ICD-10-CM

## 2013-05-22 MED ORDER — ESTRADIOL 0.1 MG/GM VA CREA: 2.0000 g | TOPICAL_CREAM | Freq: Every day | VAGINAL | Status: DC

## 2013-05-22 MED ORDER — TRAMADOL HCL 50 MG PO TABS: ORAL_TABLET | ORAL | Status: DC

## 2013-05-22 NOTE — Telephone Encounter (Signed)
Pt is sch for 11:15am

## 2013-05-22 NOTE — Telephone Encounter (Signed)
Please schedule patient for today.

## 2013-05-22 NOTE — Telephone Encounter (Signed)
Patient Information:  Caller Name: Dulce  Phone: [phone] OR [phone] (cell)  Patient: Brooke, Hansen  Gender: Female  DOB: [birth year]  Age: 57 Years  PCP: Kelle Darting [facility])  Office Follow Up:  Does the office need to follow up with this patient?: Yes  Instructions For The Office: No appointments available with Dr. Tawanna Cooler at this time.  Instructions are to send note for possible work in appointment. Patient advised to anticipate call regarding appointment ASAP.   Symptoms  Reason For Call & Symptoms: Pain in ribs on right side worsening; history of pneumonia June 2014.  Emergent symptoms ruled out.  See Today in Office due to "Intermittent chest pains persist < 3 days"  Reviewed Health History In EMR: Yes  Reviewed Medications In EMR: Yes  Reviewed Allergies In EMR: Yes  Reviewed Surgeries / Procedures: Yes  Date of Onset of Symptoms: 05/08/2013  Treatments Tried: Aleve - some relief  Treatments Tried Worked: Yes  Guideline(s) Used:  Chest Pain  Disposition Per Guideline:   See Today in Office  Reason For Disposition Reached:   Intermittent chest pains persist > 3 days  Advice Given:  Chest Pain Only When Coughing:  Chest pains that occur with coughing generally come from the chest wall and from irritation of the airways. They are usually not serious.  Call Back If:  Constant chest pain lasting longer than 5 minutes  Difficulty breathing  Fever  You become worse.  Patient Will Follow Care Advice:  YES

## 2013-05-22 NOTE — Patient Instructions (Signed)
Motrin 600 mg twice daily with food  Tramadol 50 mg,,,,,,,,,, one half to one tablet 3 times daily when necessary for pain  If the pain persists call [facility] and we will get you set up for a followup chest x-ray

## 2013-05-22 NOTE — Progress Notes (Signed)
  Subjective:    Patient ID: Brooke Hansen, female    DOB: 1956-06-20, 57 y.o.   MRN: [id number]  HPI Brooke Hansen is a 57 year old married female nonsmoker who comes in today for evaluation of 2 issues  About 10 days ago she developed severe pain in right side of her chest. It increases coughing and decreases with movement. She says the pain comes and goes it varies from 5 to an 8. She has no fever chills. No cardiac or pulmonary symptoms. She points to her right lateral chest wall H. Rib as a source for pain.   She did have pneumoniaand we treated her with medication. Pneumonia has resolved.  We did give her some Premarin vaginal cream however she says it makes her labia it's more.   Review of Systems Review of systems otherwise negative    Objective:   Physical Exam Well-developed well-nourished female no acute distress cardiopulmonary exam normal       Assessment & Plan:  Chest wall pain reassured return,,,,,,,when necessary  Postmenopausal vaginal dryness reaction to the Premarin vaginal cream switch to Estrace

## 2013-06-12 ENCOUNTER — Other Ambulatory Visit: Payer: Self-pay

## 2013-06-12 DIAGNOSIS — Z1231 Encounter for screening mammogram for malignant neoplasm of breast: Secondary | ICD-10-CM

## 2013-06-21 ENCOUNTER — Ambulatory Visit (INDEPENDENT_AMBULATORY_CARE_PROVIDER_SITE_OTHER): Payer: BC Managed Care – PPO | Admitting: *Deleted

## 2013-06-21 DIAGNOSIS — Z111 Encounter for screening for respiratory tuberculosis: Secondary | ICD-10-CM

## 2013-06-21 DIAGNOSIS — Z0000 Encounter for general adult medical examination without abnormal findings: Secondary | ICD-10-CM

## 2013-06-24 LAB — TB SKIN TEST: TB Skin Test: NEGATIVE

## 2013-06-27 ENCOUNTER — Ambulatory Visit: Payer: BC Managed Care – PPO

## 2013-07-26 ENCOUNTER — Ambulatory Visit
Admission: RE | Admit: 2013-07-26 | Discharge: 2013-07-26 | Disposition: A | Discharge status: Home / Self Care | Payer: BC Managed Care – PPO | Source: Ambulatory Visit

## 2013-07-26 DIAGNOSIS — Z1231 Encounter for screening mammogram for malignant neoplasm of breast: Secondary | ICD-10-CM

## 2013-07-31 ENCOUNTER — Other Ambulatory Visit: Payer: BC Managed Care – PPO

## 2013-08-07 ENCOUNTER — Encounter: Payer: BC Managed Care – PPO | Admitting: Family Medicine

## 2013-08-23 ENCOUNTER — Telehealth: Payer: Self-pay | Admitting: Family Medicine

## 2013-08-23 ENCOUNTER — Other Ambulatory Visit: Payer: Self-pay | Admitting: Family Medicine

## 2013-08-23 DIAGNOSIS — Z0000 Encounter for general adult medical examination without abnormal findings: Secondary | ICD-10-CM

## 2013-08-23 DIAGNOSIS — J309 Allergic rhinitis, unspecified: Secondary | ICD-10-CM

## 2013-08-23 DIAGNOSIS — E785 Hyperlipidemia, unspecified: Secondary | ICD-10-CM

## 2013-08-23 MED ORDER — SIMVASTATIN 40 MG PO TABS: 40.0000 mg | ORAL_TABLET | Freq: Every day | ORAL | Status: DC

## 2013-08-23 MED ORDER — FLUTICASONE PROPIONATE 50 MCG/ACT NA SUSP: 2.0000 | Freq: Every day | NASAL | Status: DC

## 2013-08-23 NOTE — Telephone Encounter (Signed)
Pt needs to go to the [facility] for her cpe labs. Due to her work schedule. can you put the order in?

## 2013-08-26 ENCOUNTER — Other Ambulatory Visit (INDEPENDENT_AMBULATORY_CARE_PROVIDER_SITE_OTHER): Payer: BC Managed Care – PPO

## 2013-08-26 DIAGNOSIS — E785 Hyperlipidemia, unspecified: Secondary | ICD-10-CM

## 2013-08-26 LAB — LIPID PANEL
HDL: 78.9 mg/dL (ref >39.00)
LDL Cholesterol: 98 mg/dL (ref 0–99)
Total CHOL/HDL Ratio: 2
Triglycerides: 66 mg/dL (ref 0.0–149.0)
VLDL: 13.2 mg/dL (ref 0.0–40.0)

## 2013-08-26 LAB — CBC WITH DIFFERENTIAL/PLATELET
Basophils Absolute: 0 10*3/uL (ref 0.0–0.1)
Basophils Relative: 0.4 % (ref 0.0–3.0)
Eosinophils Absolute: 0.2 10*3/uL (ref 0.0–0.7)
Hemoglobin: 13.6 g/dL (ref 12.0–15.0)
Lymphocytes Relative: 28.7 % (ref 12.0–46.0)
Lymphs Abs: 1.6 10*3/uL (ref 0.7–4.0)
MCHC: 33.4 g/dL (ref 30.0–36.0)
Monocytes Relative: 9.6 % (ref 3.0–12.0)
Neutro Abs: 3.3 10*3/uL (ref 1.4–7.7)
RDW: 13.9 % (ref 11.5–14.6)

## 2013-08-26 LAB — HEPATIC FUNCTION PANEL
AST: 27 U/L (ref 0–37)
Albumin: 3.9 g/dL (ref 3.5–5.2)
Alkaline Phosphatase: 64 U/L (ref 39–117)
Bilirubin, Direct: 0.1 mg/dL (ref 0.0–0.3)
Total Protein: 7 g/dL (ref 6.0–8.3)

## 2013-08-26 LAB — BASIC METABOLIC PANEL
BUN: 15 mg/dL (ref 6–23)
Calcium: 9.9 mg/dL (ref 8.4–10.5)
Creatinine, Ser: 1 mg/dL (ref 0.4–1.2)
GFR: 61.71 mL/min (ref >60.00)
Glucose, Bld: 105 mg/dL — ABNORMAL HIGH (ref 70–99)
Potassium: 4.8 mEq/L (ref 3.5–5.1)

## 2013-08-26 NOTE — Telephone Encounter (Signed)
Labs ordered.

## 2013-09-02 ENCOUNTER — Other Ambulatory Visit: Payer: BC Managed Care – PPO

## 2013-09-09 ENCOUNTER — Other Ambulatory Visit (HOSPITAL_COMMUNITY)
Admission: RE | Admit: 2013-09-09 | Discharge: 2013-09-09 | Disposition: A | Discharge status: Home / Self Care | Payer: BC Managed Care – PPO | Source: Ambulatory Visit | Attending: Family Medicine | Admitting: Family Medicine

## 2013-09-09 ENCOUNTER — Encounter: Payer: Self-pay | Admitting: Family Medicine

## 2013-09-09 ENCOUNTER — Ambulatory Visit (INDEPENDENT_AMBULATORY_CARE_PROVIDER_SITE_OTHER): Payer: BC Managed Care – PPO | Admitting: Family Medicine

## 2013-09-09 VITALS — BP 120/88 | Temp 98.1°F | Ht 63.75 in | Wt 138.0 lb

## 2013-09-09 DIAGNOSIS — Z0000 Encounter for general adult medical examination without abnormal findings: Secondary | ICD-10-CM

## 2013-09-09 DIAGNOSIS — Z23 Encounter for immunization: Secondary | ICD-10-CM

## 2013-09-09 DIAGNOSIS — J45909 Unspecified asthma, uncomplicated: Secondary | ICD-10-CM

## 2013-09-09 DIAGNOSIS — Z01419 Encounter for gynecological examination (general) (routine) without abnormal findings: Secondary | ICD-10-CM | POA: Insufficient documentation

## 2013-09-09 DIAGNOSIS — D510 Vitamin B12 deficiency anemia due to intrinsic factor deficiency: Secondary | ICD-10-CM

## 2013-09-09 DIAGNOSIS — E785 Hyperlipidemia, unspecified: Secondary | ICD-10-CM

## 2013-09-09 DIAGNOSIS — J309 Allergic rhinitis, unspecified: Secondary | ICD-10-CM

## 2013-09-09 DIAGNOSIS — N952 Postmenopausal atrophic vaginitis: Secondary | ICD-10-CM

## 2013-09-09 LAB — POCT URINALYSIS DIPSTICK
Leukocytes, UA: NEGATIVE
Nitrite, UA: NEGATIVE
Protein, UA: NEGATIVE
Urobilinogen, UA: 0.2
pH, UA: 7

## 2013-09-09 MED ORDER — ALBUTEROL SULFATE HFA 108 (90 BASE) MCG/ACT IN AERS: 2.0000 | INHALATION_SPRAY | Freq: Four times a day (QID) | RESPIRATORY_TRACT | Status: DC | PRN

## 2013-09-09 MED ORDER — FLUTICASONE PROPIONATE 50 MCG/ACT NA SUSP: 2.0000 | Freq: Every day | NASAL | Status: DC

## 2013-09-09 MED ORDER — SIMVASTATIN 40 MG PO TABS: 40.0000 mg | ORAL_TABLET | Freq: Every day | ORAL | Status: DC

## 2013-09-09 MED ORDER — CYANOCOBALAMIN 1000 MCG/ML IJ SOLN: 1000.0000 ug | INTRAMUSCULAR | Status: DC

## 2013-09-09 MED ORDER — ESTRADIOL 0.1 MG/GM VA CREA: TOPICAL_CREAM | VAGINAL | Status: DC

## 2013-09-09 NOTE — Addendum Note (Signed)
Addended by: Kern Reap B on: 09/09/2013 05:12 PM   Modules accepted: Orders

## 2013-09-09 NOTE — Progress Notes (Signed)
  Subjective:    Patient ID: Brooke Hansen, female    DOB: [birth year], 57 y.o.   MRN: [id number]  HPI Brooke Hansen is a 57 year old female nonsmoker who comes in today for general medical exam  She takes albuterol when necessary when she has a viral infection that triggered her asthma  She takes vitamin B12 1 cc monthly for because of a history of pernicious anemia  He uses hormonal cream for vaginal dryness  He's a steroid nasal spray and over-the-counter antihistamine when necessary  She takes simvastatin 40 mg daily for hyperlipidemia. She can take an aspirin makes her bruise.  She gets routine eye care, dental care, BSE monthly, recent mammogram in September, first colonoscopy at age 72 because of anemia. It was normal.   Review of Systems  Constitutional: Negative.   HENT: Negative.   Eyes: Negative.   Respiratory: Negative.   Cardiovascular: Negative.   Gastrointestinal: Negative.   Endocrine: Negative.   Genitourinary: Negative.   Musculoskeletal: Negative.   Allergic/Immunologic: Negative.   Neurological: Negative.   Hematological: Negative.   Psychiatric/Behavioral: Negative.        Objective:   Physical Exam  Nursing note and vitals reviewed. Constitutional: She appears well-developed and well-nourished.  HENT:  Head: Normocephalic and atraumatic.  Right Ear: External ear normal.  Left Ear: External ear normal.  Nose: Nose normal.  Mouth/Throat: Oropharynx is clear and moist.  Eyes: EOM are normal. Pupils are equal, round, and reactive to light.  Neck: Normal range of motion. Neck supple. No thyromegaly present.  Cardiovascular: Normal rate, regular rhythm, normal heart sounds and intact distal pulses.  Exam reveals no gallop and no friction rub.   No murmur heard. No carotid or aortic bruits peripheral pulses 2+ and symmetrical  Pulmonary/Chest: Effort normal and breath sounds normal.  Abdominal: Soft. Bowel sounds are normal. She exhibits no distension and  no mass. There is no tenderness. There is no rebound.  Genitourinary: Vagina normal and uterus normal. Guaiac negative stool. No vaginal discharge found.  Bilateral breast exam normal  Musculoskeletal: Normal range of motion.  Lymphadenopathy:    She has no cervical adenopathy.  Neurological: She is alert. She has normal reflexes. No cranial nerve deficit. She exhibits normal muscle tone. Coordination normal.  Skin: Skin is warm and dry.  Total body skin exam normal  Psychiatric: She has a normal mood and affect. Her behavior is normal. Judgment and thought content normal.          Assessment & Plan:  Healthy female  History of B12 deficiency continue B12 1 cc monthly  Postmenopausal vaginal dryness continue hormonal cream  Allergic rhinitis steroid nasal spray and OTC plain Claritin  Hyperlipidemia continue simvastatin 40 mg daily

## 2013-09-09 NOTE — Patient Instructions (Signed)
Continue current medications  Return in one year sooner if any problem

## 2013-10-02 ENCOUNTER — Encounter: Payer: Self-pay | Admitting: Family Medicine

## 2013-10-02 ENCOUNTER — Ambulatory Visit (INDEPENDENT_AMBULATORY_CARE_PROVIDER_SITE_OTHER): Payer: BC Managed Care – PPO | Admitting: Family Medicine

## 2013-10-02 VITALS — BP 120/68 | Temp 98.9°F | Wt 138.0 lb

## 2013-10-02 DIAGNOSIS — J069 Acute upper respiratory infection, unspecified: Secondary | ICD-10-CM

## 2013-10-02 DIAGNOSIS — J0100 Acute maxillary sinusitis, unspecified: Secondary | ICD-10-CM

## 2013-10-02 MED ORDER — AZITHROMYCIN 250 MG PO TABS: ORAL_TABLET | ORAL | Status: DC

## 2013-10-02 NOTE — Patient Instructions (Signed)
INSTRUCTIONS FOR UPPER RESPIRATORY INFECTION:  -plenty of rest and fluids  -nasal saline wash 2-3 times daily (use prepackaged nasal saline or bottled/distilled water if making your own)   -can use sinex or afrin  nasal spray for drainage and nasal congestion - but do NOT use longer then 3-4 days  -can use tylenol or ibuprofen as directed for aches and sorethroat  -in the winter time, using a humidifier at night is helpful (please follow cleaning instructions)  -if you are taking a cough medication - use only as directed, may also try a teaspoon of honey to coat the throat and throat lozenges  -for sore throat, salt water gargles can help  -use antibiotic if sinus pain not improving over next few days -As we discussed, we have prescribed a new medication for you at this appointment. We discussed the common and serious potential adverse effects of this medication and you can review these and more with the pharmacist when you pick up your medication.  Please follow the instructions for use carefully and notify us immediately if you have any problems taking this medication.  -follow up if you have fevers, facial pain, tooth pain, difficulty breathing or are worsening or not getting better in 5-7 days

## 2013-10-02 NOTE — Progress Notes (Signed)
Pre visit review using our clinic review tool, if applicable. No additional management support is needed unless otherwise documented below in the visit note. 

## 2013-10-02 NOTE — Progress Notes (Signed)
Chief Complaint  Patient presents with  . Sinusitis    HPI:  -started: -symptoms:nasal congestion, drainage in  throat, cough, R max sinus pain a little today -denies:fever, SOB, NVD, tooth pain, wheezing -has tried:  -sick contacts/travel/risks: denies flu exposure, tick exposure or or Ebola risks -Hx of: allergies, mild asthma - not having asthma symptoms, sinusitis ROS: See pertinent positives and negatives per HPI.  Past Medical History  Diagnosis Date  . RLS (restless legs syndrome)   . Allergy   . Anemia   . Asthma   . Transfusion of blood during current hospitalization   . Hyperlipidemia   . History of nephrectomy     right - nonfunctioning kidney    Past Surgical History  Procedure Laterality Date  . Cesarean section    . Nephrectomy      right    Family History  Problem Relation Age of Onset  . Alcohol abuse Other   . Diabetes Other   . Hyperlipidemia Other   . Coronary artery disease Other     History   Social History  . Marital Status: Married    Spouse Name: N/A    Number of Children: N/A  . Years of Education: N/A   Social History Main Topics  . Smoking status: Former Games developer  . Smokeless tobacco: None  . Alcohol Use:   . Drug Use:   . Sexual Activity:    Other Topics Concern  . None   Social History Narrative  . None    Current outpatient prescriptions:albuterol (PROVENTIL HFA) 108 (90 BASE) MCG/ACT inhaler, Inhale 2 puffs into the lungs every 6 (six) hours as needed., Disp: 18 g, Rfl: 1;  cyanocobalamin (,VITAMIN B-12,) 1000 MCG/ML injection, Inject 1 mL (1,000 mcg total) into the muscle every 30 (thirty) days., Disp: 30 mL, Rfl: 1;  estradiol (ESTRACE VAGINAL) 0.1 MG/GM vaginal cream, uad, Disp: 42.5 g, Rfl: 12 fluticasone (FLONASE) 50 MCG/ACT nasal spray, Place 2 sprays into the nose daily., Disp: 16 g, Rfl: 11;  simvastatin (ZOCOR) 40 MG tablet, Take 1 tablet (40 mg total) by mouth at bedtime., Disp: 100 tablet, Rfl: 3;  azithromycin  (ZITHROMAX) 250 MG tablet, 2 tabs first day then 1 tab daily for 4 more days, Disp: 6 tablet, Rfl: 0  EXAM:  Filed Vitals:   10/02/13 1358  BP: 120/68  Temp: 98.9 F (37.2 C)    Body mass index is 23.88 kg/(m^2).  GENERAL: vitals reviewed and listed above, alert, oriented, appears well hydrated and in no acute distress  HEENT: atraumatic, conjunttiva clear, no obvious abnormalities on inspection of external nose and ears, normal appearance of ear canals and TMs, clear nasal congestion, mild post oropharyngeal erythema with PND, no tonsillar edema or exudate, no sinus TTP  NECK: no obvious masses on inspection  LUNGS: clear to auscultation bilaterally, no wheezes, rales or rhonchi, good air movement  CV: HRRR, no peripheral edema  MS: moves all extremities without noticeable abnormality  PSYCH: pleasant and cooperative, no obvious depression or anxiety  ASSESSMENT AND PLAN:  Discussed the following assessment and plan:  Sinusitis, acute, maxillary - Plan: azithromycin (ZITHROMAX) 250 MG tablet  Acute upper respiratory infections of unspecified site - Plan: azithromycin (ZITHROMAX) 250 MG tablet  -given HPI and exam findings today, a serious infection or illness is unlikely. We discussed potential etiologies, with VURI being most likely, and advised supportive care and monitoring. We discussed treatment side effects, likely course, antibiotic misuse, transmission, and signs of developing a  serious illness. -abx given after discussion risks in case sinus pain persists -of course, we advised to return or notify a doctor immediately if symptoms worsen or new concerns arise or tx not effective.    Patient Instructions  INSTRUCTIONS FOR UPPER RESPIRATORY INFECTION:  -plenty of rest and fluids  -nasal saline wash 2-3 times daily (use prepackaged nasal saline or bottled/distilled water if making your own)   -can use sinex or afrin  nasal spray for drainage and nasal congestion  - but do NOT use longer then 3-4 days  -can use tylenol or ibuprofen as directed for aches and sorethroat  -in the winter time, using a humidifier at night is helpful (please follow cleaning instructions)  -if you are taking a cough medication - use only as directed, may also try a teaspoon of honey to coat the throat and throat lozenges  -for sore throat, salt water gargles can help  -use antibiotic if sinus pain not improving over next few days -As we discussed, we have prescribed a new medication for you at this appointment. We discussed the common and serious potential adverse effects of this medication and you can review these and more with the pharmacist when you pick up your medication.  Please follow the instructions for use carefully and notify us immediately if you have any problems taking this medication.  -follow up if you have fevers, facial pain, tooth pain, difficulty breathing or are worsening or not getting better in 5-7 days      Tamber Burtch, [facility] R.

## 2014-06-23 ENCOUNTER — Other Ambulatory Visit: Payer: Self-pay

## 2014-06-23 DIAGNOSIS — Z1231 Encounter for screening mammogram for malignant neoplasm of breast: Secondary | ICD-10-CM

## 2014-07-08 ENCOUNTER — Ambulatory Visit: Payer: BC Managed Care – PPO

## 2014-08-08 ENCOUNTER — Ambulatory Visit
Admission: RE | Admit: 2014-08-08 | Discharge: 2014-08-08 | Disposition: A | Discharge status: Home / Self Care | Payer: BC Managed Care – PPO | Source: Ambulatory Visit

## 2014-08-08 DIAGNOSIS — Z1231 Encounter for screening mammogram for malignant neoplasm of breast: Secondary | ICD-10-CM

## 2014-09-15 ENCOUNTER — Encounter: Payer: Self-pay | Admitting: Family Medicine

## 2014-10-13 ENCOUNTER — Other Ambulatory Visit (INDEPENDENT_AMBULATORY_CARE_PROVIDER_SITE_OTHER): Payer: BC Managed Care – PPO

## 2014-10-13 DIAGNOSIS — Z0000 Encounter for general adult medical examination without abnormal findings: Secondary | ICD-10-CM

## 2014-10-13 LAB — CBC WITH DIFFERENTIAL/PLATELET
Basophils Absolute: 0 10*3/uL (ref 0.0–0.1)
Basophils Relative: 0.5 % (ref 0.0–3.0)
EOS PCT: 2.9 % (ref 0.0–5.0)
Eosinophils Absolute: 0.2 10*3/uL (ref 0.0–0.7)
HCT: 40.5 % (ref 36.0–46.0)
Hemoglobin: 13.1 g/dL (ref 12.0–15.0)
Lymphocytes Relative: 27.2 % (ref 12.0–46.0)
Lymphs Abs: 1.7 10*3/uL (ref 0.7–4.0)
MCHC: 32.3 g/dL (ref 30.0–36.0)
MCV: 92 fl (ref 78.0–100.0)
MONOS PCT: 8.4 % (ref 3.0–12.0)
Monocytes Absolute: 0.5 10*3/uL (ref 0.1–1.0)
Neutro Abs: 3.9 10*3/uL (ref 1.4–7.7)
Neutrophils Relative %: 61 % (ref 43.0–77.0)
PLATELETS: 225 10*3/uL (ref 150.0–400.0)
RBC: 4.41 Mil/uL (ref 3.87–5.11)
RDW: 15.2 % (ref 11.5–15.5)
WBC: 6.4 10*3/uL (ref 4.0–10.5)

## 2014-10-13 LAB — LIPID PANEL
CHOL/HDL RATIO: 3
Cholesterol: 224 mg/dL — ABNORMAL HIGH (ref 0–200)
HDL: 65.7 mg/dL (ref >39.00)
LDL Cholesterol: 143 mg/dL — ABNORMAL HIGH (ref 0–99)
NONHDL: 158.3
Triglycerides: 75 mg/dL (ref 0.0–149.0)
VLDL: 15 mg/dL (ref 0.0–40.0)

## 2014-10-13 LAB — POCT URINALYSIS DIPSTICK
BILIRUBIN UA: NEGATIVE
Glucose, UA: NEGATIVE
Ketones, UA: NEGATIVE
Leukocytes, UA: NEGATIVE
Nitrite, UA: NEGATIVE
PH UA: 7
Protein, UA: NEGATIVE
RBC UA: NEGATIVE
SPEC GRAV UA: 1.015
Urobilinogen, UA: 0.2

## 2014-10-13 LAB — BASIC METABOLIC PANEL
BUN: 21 mg/dL (ref 6–23)
CO2: 27 mEq/L (ref 19–32)
Calcium: 9.5 mg/dL (ref 8.4–10.5)
Chloride: 101 mEq/L (ref 96–112)
Creatinine, Ser: 0.9 mg/dL (ref 0.4–1.2)
GFR: 65.3 mL/min (ref >60.00)
Glucose, Bld: 96 mg/dL (ref 70–99)
POTASSIUM: 4.9 meq/L (ref 3.5–5.1)
Sodium: 137 mEq/L (ref 135–145)

## 2014-10-13 LAB — TSH: TSH: 2.19 u[IU]/mL (ref 0.35–4.50)

## 2014-10-13 LAB — HEPATIC FUNCTION PANEL
ALK PHOS: 73 U/L (ref 39–117)
ALT: 19 U/L (ref 0–35)
AST: 26 U/L (ref 0–37)
Albumin: 4 g/dL (ref 3.5–5.2)
BILIRUBIN DIRECT: 0 mg/dL (ref 0.0–0.3)
BILIRUBIN TOTAL: 0.5 mg/dL (ref 0.2–1.2)
Total Protein: 6.8 g/dL (ref 6.0–8.3)

## 2014-10-20 ENCOUNTER — Encounter: Payer: Self-pay | Admitting: Family Medicine

## 2014-10-20 ENCOUNTER — Ambulatory Visit (INDEPENDENT_AMBULATORY_CARE_PROVIDER_SITE_OTHER): Payer: BC Managed Care – PPO | Admitting: Family Medicine

## 2014-10-20 VITALS — BP 110/80 | Temp 98.7°F | Ht 63.5 in | Wt 145.0 lb

## 2014-10-20 DIAGNOSIS — D510 Vitamin B12 deficiency anemia due to intrinsic factor deficiency: Secondary | ICD-10-CM

## 2014-10-20 DIAGNOSIS — J302 Other seasonal allergic rhinitis: Secondary | ICD-10-CM

## 2014-10-20 DIAGNOSIS — E785 Hyperlipidemia, unspecified: Secondary | ICD-10-CM

## 2014-10-20 MED ORDER — FLUTICASONE PROPIONATE 50 MCG/ACT NA SUSP: 2.0000 | Freq: Every day | NASAL | Status: DC

## 2014-10-20 MED ORDER — CYANOCOBALAMIN 1000 MCG/ML IJ SOLN: 1000.0000 ug | INTRAMUSCULAR | Status: DC

## 2014-10-20 MED ORDER — SIMVASTATIN 40 MG PO TABS: 40.0000 mg | ORAL_TABLET | Freq: Every day | ORAL | Status: DC

## 2014-10-20 NOTE — Progress Notes (Signed)
   Subjective:    Patient ID: Brooke Hansen, female    DOB: [birth year], 58 y.o.   MRN: [id number]  HPI [name] is a 58 year old female nonsmoker who comes in today for general physical examination because of a history of hyperlipidemia and pernicious anemia  She was diagnosed in her mid 75s to have pernicious anemia. The time she went to complete GI workup which included a colonoscopy by Dr. Deatra Ina. She would like to see him now again for a follow-up screening colonoscopy  She takes Zocor 40 mg daily for hyperlipidemia lipids are at goal  She gets routine eye care, dental care, BSE monthly, annual mammography, due for colonoscopy.  She works out 5 days a week at [employer] works 3 days a Horticulturist, commercial in a middle school  LMP around age 69 repeated Pap smears all that time of all been normal. Therefore she is asymptomatic I would recommend pelvic and Pap smear every 3 years   Review of Systems  Constitutional: Negative.   HENT: Negative.   Eyes: Negative.   Respiratory: Negative.   Cardiovascular: Negative.   Gastrointestinal: Negative.   Endocrine: Negative.   Genitourinary: Negative.   Musculoskeletal: Negative.   Skin: Negative.   Allergic/Immunologic: Negative.   Neurological: Negative.   Hematological: Negative.   Psychiatric/Behavioral: Negative.        Objective:   Physical Exam  Constitutional: She appears well-developed and well-nourished.  HENT:  Head: Normocephalic and atraumatic.  Right Ear: External ear normal.  Left Ear: External ear normal.  Nose: Nose normal.  Mouth/Throat: Oropharynx is clear and moist.  Eyes: EOM are normal. Pupils are equal, round, and reactive to light.  Neck: Normal range of motion. Neck supple. No JVD present. No tracheal deviation present. No thyromegaly present.  Cardiovascular: Normal rate, regular rhythm, normal heart sounds and intact distal pulses.  Exam reveals no gallop and no friction rub.   No murmur  heard. Pulmonary/Chest: Effort normal and breath sounds normal. No stridor. No respiratory distress. She has no wheezes. She has no rales. She exhibits no tenderness.  Abdominal: Soft. Bowel sounds are normal. She exhibits no distension and no mass. There is no tenderness. There is no rebound and no guarding.  Genitourinary:  Bilateral breast exam normal  Musculoskeletal: Normal range of motion.  Lymphadenopathy:    She has no cervical adenopathy.  Neurological: She is alert. She has normal reflexes. No cranial nerve deficit. She exhibits normal muscle tone. Coordination normal.  Skin: Skin is warm and dry. No rash noted. No erythema. No pallor.  Total body skin exam normal  Psychiatric: She has a normal mood and affect. Her behavior is normal. Judgment and thought content normal.  Nursing note and vitals reviewed.         Assessment & Plan:  Healthy female  History of B12 deficiency continue B12 1 mL monthly  Allergic rhinitis continue steroid nasal spray and OTC Claritin  Hyperlipidemia continue Zocor 40 mg  Due for a colonoscopy for screening purposes.... We'll refer back to Dr. Deatra Ina

## 2014-10-20 NOTE — Patient Instructions (Signed)
Continue current medications  Follow-up in 1 year sooner if any problems  We will get you set up for a screening colonoscopy

## 2014-11-03 ENCOUNTER — Encounter: Payer: Self-pay | Admitting: Gastroenterology

## 2014-12-01 ENCOUNTER — Encounter: Payer: Self-pay | Admitting: Gastroenterology

## 2015-01-27 ENCOUNTER — Ambulatory Visit (AMBULATORY_SURGERY_CENTER): Payer: Self-pay | Admitting: *Deleted

## 2015-01-27 VITALS — Ht 64.0 in | Wt 145.4 lb

## 2015-01-27 DIAGNOSIS — Z1211 Encounter for screening for malignant neoplasm of colon: Secondary | ICD-10-CM

## 2015-01-27 MED ORDER — NA SULFATE-K SULFATE-MG SULF 17.5-3.13-1.6 GM/177ML PO SOLN: ORAL | Status: DC

## 2015-01-27 NOTE — Progress Notes (Signed)
No allergies to eggs or soy. No problems with anesthesia.  Pt given Emmi instructions for colonoscopy  No oxygen use  No diet drug use  

## 2015-01-28 ENCOUNTER — Encounter: Payer: Self-pay | Admitting: Family Medicine

## 2015-01-28 ENCOUNTER — Ambulatory Visit (INDEPENDENT_AMBULATORY_CARE_PROVIDER_SITE_OTHER): Payer: BC Managed Care – PPO | Admitting: Family Medicine

## 2015-01-28 VITALS — BP 120/78 | HR 68 | Temp 98.6°F | Wt 145.0 lb

## 2015-01-28 DIAGNOSIS — H9121 Sudden idiopathic hearing loss, right ear: Secondary | ICD-10-CM

## 2015-01-28 DIAGNOSIS — H938X1 Other specified disorders of right ear: Secondary | ICD-10-CM

## 2015-01-28 MED ORDER — PREDNISONE 10 MG PO TABS: ORAL_TABLET | ORAL | Status: DC

## 2015-01-28 NOTE — Progress Notes (Signed)
Pre visit review using our clinic review tool, if applicable. No additional management support is needed unless otherwise documented below in the visit note. 

## 2015-01-28 NOTE — Progress Notes (Signed)
   Subjective:    Patient ID: Brooke Hansen, female    DOB: Dec 03, 1955, 59 y.o.   MRN: [id number]  HPI   Acute visit. Patient seen with "ringing" in the right ear and sensation of fullness over the past 2-1/2 weeks. She's had some minimal nasal congestion and drainage. No fever. No vertigo. She feels that her hearing is slightly diminished on the right side but these symptoms seem to come and go somewhat. She takes Flonase regularly. Denies any nausea or vomiting.  No recent flying or altitude change.  Past Medical History  Diagnosis Date  . RLS (restless legs syndrome)   . Allergy   . Anemia   . Asthma   . Transfusion of blood during current hospitalization   . Hyperlipidemia   . History of nephrectomy     right - nonfunctioning kidney   Past Surgical History  Procedure Laterality Date  . Cesarean section  1986  . Nephrectomy Right 1975    reports that she quit smoking about 31 years ago. She has never used smokeless tobacco. She reports that she drinks about 0.6 oz of alcohol per week. She reports that she does not use illicit drugs. family history includes Alcohol abuse in her other; Coronary artery disease in her other; Diabetes in her other; Hyperlipidemia in her other. There is no history of Colon cancer. Allergies  Allergen Reactions  . Amoxicillin     REACTION: unspecified  . Septra [Sulfamethoxazole-Trimethoprim] Other (See Comments)    Per pt: unknown      Review of Systems  Constitutional: Negative for fever and chills.  HENT: Positive for congestion and tinnitus. Negative for ear pain.   Eyes: Negative for visual disturbance.  Respiratory: Negative for cough.   Skin: Negative for rash.  Neurological: Negative for weakness and headaches.  Hematological: Negative for adenopathy.       Objective:   Physical Exam  Constitutional: She is oriented to person, place, and time. She appears well-developed and well-nourished.  HENT:  Mouth/Throat: Oropharynx is  clear and moist.  Both ear canals are devoid of significant cerumen. Eardrums appear normal  Neck: Neck supple.  Cardiovascular: Normal rate and regular rhythm.   Pulmonary/Chest: Effort normal and breath sounds normal. No respiratory distress. She has no wheezes. She has no rales.  Lymphadenopathy:    She has no cervical adenopathy.  Neurological: She is alert and oriented to person, place, and time. No cranial nerve deficit. Coordination normal.  Cerebellar function normal  Skin: No rash noted.          Assessment & Plan:  Patient presents with subjective intermittent diminished hearing right ear with possible mild tinnitus.  No cerumen and nonfocal exam. No obvious effusion.  No vertigo and no hx of Meniere's.  Check audiometry.  Audiometry shows mild hearing loss right ear vs left at multiple frequencies.  Will start Prednisone and set up ENT referral.

## 2015-02-10 ENCOUNTER — Encounter: Payer: Self-pay | Admitting: Gastroenterology

## 2015-02-10 ENCOUNTER — Ambulatory Visit (AMBULATORY_SURGERY_CENTER): Payer: BC Managed Care – PPO | Admitting: Gastroenterology

## 2015-02-10 VITALS — BP 97/68 | HR 54 | Temp 97.0°F | Resp 53 | Ht 64.0 in | Wt 145.0 lb

## 2015-02-10 DIAGNOSIS — Z1211 Encounter for screening for malignant neoplasm of colon: Secondary | ICD-10-CM | POA: Diagnosis not present

## 2015-02-10 DIAGNOSIS — K648 Other hemorrhoids: Secondary | ICD-10-CM

## 2015-02-10 MED ORDER — SODIUM CHLORIDE 0.9 % IV SOLN: 500.0000 mL | INTRAVENOUS | Status: DC

## 2015-02-10 NOTE — Op Note (Signed)
[place]  [address] [place], [id number]   COLONOSCOPY PROCEDURE REPORT  PATIENT: Brooke Hansen, Brooke Hansen  MR#: [id number] BIRTHDATE: 1955-12-03 , 58  yrs. old GENDER: female ENDOSCOPIST: Inda Castle, MD REFERRED ZC:HYIFOYD Delora Fuel, M.D. PROCEDURE DATE:  02/10/2015 PROCEDURE:   Colonoscopy, screening First Screening Colonoscopy - Avg.  risk and is 50 yrs.  old or older - No.  Prior Negative Screening - Now for repeat screening. 10 or more years since last screening  History of Adenoma - Now for follow-up colonoscopy & has been > or = to 3 yrs.  N/A ASA CLASS:   Class II INDICATIONS:average risk patient for colon cancer and Colorectal Neoplasm Risk Assessment for this procedure is average risk. MEDICATIONS: Monitored anesthesia care and Propofol 230 mg IV  DESCRIPTION OF PROCEDURE:   After the risks benefits and alternatives of the procedure were thoroughly explained, informed consent was obtained.  The digital rectal exam revealed no abnormalities of the rectum.   The LB [id number]  endoscope was introduced through the anus and advanced to the cecum, which was identified by both the appendix and ileocecal valve. No adverse events experienced.   The quality of the prep was (Suprep was used) excellent.  The instrument was then slowly withdrawn as the colon was fully examined.      COLON FINDINGS: Internal hemorrhoids were found.   The examination was otherwise normal.  Retroflexed views revealed no abnormalities. The time to cecum = 5.9 Withdrawal time = 7.5   The scope was withdrawn and the procedure completed. COMPLICATIONS: There were no immediate complications.  ENDOSCOPIC IMPRESSION: 1.   Internal hemorrhoids 2.   The examination was otherwise normal  RECOMMENDATIONS: Continue current colorectal screening recommendations for "routine risk" patients with a repeat colonoscopy in 10 years.  eSigned:  Inda Castle, MD 02/10/2015 10:14  AM   cc:

## 2015-02-10 NOTE — Progress Notes (Signed)
Procedure ends, to recovery, report to [employer], RN, VSS

## 2015-02-10 NOTE — Patient Instructions (Signed)
Discharge instructions given. Handouts on hemorrhoids and a high fiber diet. Resume previous medications. YOU HAD AN ENDOSCOPIC PROCEDURE TODAY AT THE [facility]:   Refer to the procedure report that was given to you for any specific questions about what was found during the examination.  If the procedure report does not answer your questions, please call your gastroenterologist to clarify.  If you requested that your care partner not be given the details of your procedure findings, then the procedure report has been included in a sealed envelope for you to review at your convenience later.  YOU SHOULD EXPECT: Some feelings of bloating in the abdomen. Passage of more gas than usual.  Walking can help get rid of the air that was put into your GI tract during the procedure and reduce the bloating. If you had a lower endoscopy (such as a colonoscopy or flexible sigmoidoscopy) you may notice spotting of blood in your stool or on the toilet paper. If you underwent a bowel prep for your procedure, you may not have a normal bowel movement for a few days.  Please Note:  You might notice some irritation and congestion in your nose or some drainage.  This is from the oxygen used during your procedure.  There is no need for concern and it should clear up in a day or so.  SYMPTOMS TO REPORT IMMEDIATELY:   Following lower endoscopy (colonoscopy or flexible sigmoidoscopy):  Excessive amounts of blood in the stool  Significant tenderness or worsening of abdominal pains  Swelling of the abdomen that is new, acute  Fever of 100F or higher   For urgent or emergent issues, a gastroenterologist can be reached at any hour by calling [phone].   DIET: Your first meal following the procedure should be a small meal and then it is ok to progress to your normal diet. Heavy or fried foods are harder to digest and may make you feel nauseous or bloated.  Likewise, meals heavy in dairy and vegetables  can increase bloating.  Drink plenty of fluids but you should avoid alcoholic beverages for 24 hours.  ACTIVITY:  You should plan to take it easy for the rest of today and you should NOT DRIVE or use heavy machinery until tomorrow (because of the sedation medicines used during the test).    FOLLOW UP: Our staff will call the number listed on your records the next business day following your procedure to check on you and address any questions or concerns that you may have regarding the information given to you following your procedure. If we do not reach you, we will leave a message.  However, if you are feeling well and you are not experiencing any problems, there is no need to return our call.  We will assume that you have returned to your regular daily activities without incident.  If any biopsies were taken you will be contacted by phone or by letter within the next 1-3 weeks.  Please call us at [phone] if you have not heard about the biopsies in 3 weeks.    SIGNATURES/CONFIDENTIALITY: You and/or your care partner have signed paperwork which will be entered into your electronic medical record.  These signatures attest to the fact that that the information above on your After Visit Summary has been reviewed and is understood.  Full responsibility of the confidentiality of this discharge information lies with you and/or your care-partner. 

## 2015-02-11 ENCOUNTER — Telehealth: Payer: Self-pay

## 2015-02-11 NOTE — Telephone Encounter (Signed)
Left a message at [phone] for the pt to call us back if any questions or concerns. maw

## 2015-04-20 ENCOUNTER — Encounter: Payer: Self-pay | Admitting: Family Medicine

## 2015-04-20 ENCOUNTER — Ambulatory Visit (INDEPENDENT_AMBULATORY_CARE_PROVIDER_SITE_OTHER): Payer: BC Managed Care – PPO | Admitting: Family Medicine

## 2015-04-20 VITALS — BP 98/70 | HR 75 | Temp 98.7°F | Ht 64.0 in | Wt 145.2 lb

## 2015-04-20 DIAGNOSIS — L255 Unspecified contact dermatitis due to plants, except food: Secondary | ICD-10-CM | POA: Diagnosis not present

## 2015-04-20 MED ORDER — PREDNISONE 10 MG PO TABS: ORAL_TABLET | ORAL | Status: DC

## 2015-04-20 NOTE — Progress Notes (Signed)
Pre visit review using our clinic review tool, if applicable. No additional management support is needed unless otherwise documented below in the visit note. 

## 2015-04-20 NOTE — Progress Notes (Signed)
HPI:  Brooke DEPREY is a pleasant 55 F patient of Dr. Sherren Mocha here for an acute visit for:  Itchy Rash: -reports: started 1 week ago after weeding and developed itchy rash on face, forehead, trunk, arms - has had poison ivy in the past and is sure this is the same -treatments tried: tried OTC treatments for poison ivy -denies: lesions in or around eyes, nose, mouth, SOB -she has taken steroids in the past and did ok  ROS: See pertinent positives and negatives per HPI.  Past Medical History  Diagnosis Date  . RLS (restless legs syndrome)   . Allergy   . Anemia   . Asthma   . Transfusion of blood during current hospitalization   . Hyperlipidemia   . History of nephrectomy     right - nonfunctioning kidney    Past Surgical History  Procedure Laterality Date  . Cesarean section  1986  . Nephrectomy Right 1975    Family History  Problem Relation Age of Onset  . Alcohol abuse Other   . Diabetes Other   . Hyperlipidemia Other   . Coronary artery disease Other   . Colon cancer Neg Hx     History   Social History  . Marital Status: Married    Spouse Name: N/A  . Number of Children: N/A  . Years of Education: N/A   Social History Main Topics  . Smoking status: Former Smoker    Quit date: 11/15/1983  . Smokeless tobacco: Never Used  . Alcohol Use: 0.6 oz/week    1 Cans of beer per week  . Drug Use: No  . Sexual Activity: Not on file   Other Topics Concern  . None   Social History Narrative     Current outpatient prescriptions:  .  albuterol (PROVENTIL HFA) 108 (90 BASE) MCG/ACT inhaler, Inhale 2 puffs into the lungs every 6 (six) hours as needed., Disp: 18 g, Rfl: 1 .  Calcium Carbonate-Vitamin D (CALCIUM + D PO), Take by mouth daily., Disp: , Rfl:  .  cyanocobalamin (,VITAMIN B-12,) 1000 MCG/ML injection, Inject 1 mL (1,000 mcg total) into the muscle every 30 (thirty) days., Disp: 30 mL, Rfl: 1 .  fluticasone (FLONASE) 50 MCG/ACT nasal spray, Place 2 sprays  into both nostrils daily., Disp: 16 g, Rfl: 11 .  Multiple Vitamin (MULTIVITAMIN) tablet, Take 1 tablet by mouth daily., Disp: , Rfl:  .  simvastatin (ZOCOR) 40 MG tablet, Take 1 tablet (40 mg total) by mouth at bedtime., Disp: 100 tablet, Rfl: 3 .  predniSONE (DELTASONE) 10 MG tablet, 5 tablets (50mg ) daily for 3 days, then 4 tablets (40 mg) daily for 3 days, then 3 tablets (30mg ) daily for 3 days, then 2 tablets (20 mg) daily for 3 days, then 1 tablet (10 mg) daily for 3 days., Disp: 45 tablet, Rfl: 0  EXAM:  Filed Vitals:   04/20/15 1043  BP: 98/70  Pulse: 75  Temp: 98.7 F (37.1 C)    Body mass index is 24.91 kg/(m^2).  GENERAL: vitals reviewed and listed above, alert, oriented, appears well hydrated and in no acute distress  HEENT: atraumatic, conjunttiva clear, no obvious abnormalities on inspection of external nose and ears  NECK: no obvious masses on inspection  SKIN: papulovesicular rash in patchy and streaky distribution on face, neck, L leg, truck and arms  MS: moves all extremities without noticeable abnormality  PSYCH: pleasant and cooperative, no obvious depression or anxiety  ASSESSMENT AND PLAN:  Discussed  the following assessment and plan:  Toxicodendron dermatitis  -we discussed possible serious and likely etiologies, workup and treatment, treatment risks and return precautions -after this discussion, Stella opted for steroid taper after discussion risks and benefits for widespread poison ivy -follow up advised with dermatology if does not resolve -of course, we advised Denesha  to return or notify a doctor immediately if symptoms worsen or persist or new concerns arise.  .  -Patient advised to return or notify a doctor immediately if symptoms worsen or persist or new concerns arise.  Patient Instructions  Poison [facility] ivy is a inflammation of the skin (contact dermatitis) caused by touching the allergens on the leaves of the ivy plant following  previous exposure to the plant. The rash usually appears 48 hours after exposure. The rash is usually bumps (papules) or blisters (vesicles) in a linear pattern. Depending on your own sensitivity, the rash may simply cause redness and itching, or it may also progress to blisters which may break open. These must be well cared for to prevent secondary bacterial (germ) infection, followed by scarring. Keep any open areas dry, clean, dressed, and covered with an antibacterial ointment if needed. The eyes may also get puffy. The puffiness is worst in the morning and gets better as the day progresses. This dermatitis usually heals without scarring, within 2 to 3 weeks without treatment. HOME CARE INSTRUCTIONS  Thoroughly wash with soap and water as soon as you have been exposed to poison ivy. You have about one half hour to remove the plant resin before it will cause the rash. This washing will destroy the oil or antigen on the skin that is causing, or will cause, the rash. Be sure to wash under your fingernails as any plant resin there will continue to spread the rash. Do not rub skin vigorously when washing affected area. Poison ivy cannot spread if no oil from the plant remains on your body. A rash that has progressed to weeping sores will not spread the rash unless you have not washed thoroughly. It is also important to wash any clothes you have been wearing as these may carry active allergens. The rash will return if you wear the unwashed clothing, even several days later. Avoidance of the plant in the future is the best measure. Poison ivy plant can be recognized by the number of leaves. Generally, poison ivy has three leaves with flowering branches on a single stem. Diphenhydramine may be purchased over the counter and used as needed for itching. Do not drive with this medication if it makes you drowsy.Ask your caregiver about medication for children. SEEK MEDICAL CARE IF:  Open sores develop.  Redness  spreads beyond area of rash.  You notice purulent (pus-like) discharge.  You have increased pain.  Other signs of infection develop (such as fever). Document Released: 10/28/2000 Document Revised: 01/23/2012 Document Reviewed: 04/10/2009 [facility] Patient Information 2015 [place], [place]. This information is not intended to replace advice given to you by your health care provider. Make sure you discuss any questions you have with your health care provider.      Colin Benton R.

## 2015-04-20 NOTE — Patient Instructions (Signed)

## 2015-05-13 ENCOUNTER — Ambulatory Visit (INDEPENDENT_AMBULATORY_CARE_PROVIDER_SITE_OTHER): Payer: BC Managed Care – PPO | Admitting: Family Medicine

## 2015-05-13 ENCOUNTER — Encounter: Payer: Self-pay | Admitting: Family Medicine

## 2015-05-13 VITALS — BP 108/78 | HR 82 | Temp 98.3°F | Ht 64.0 in | Wt 148.3 lb

## 2015-05-13 DIAGNOSIS — L732 Hidradenitis suppurativa: Secondary | ICD-10-CM

## 2015-05-13 DIAGNOSIS — N898 Other specified noninflammatory disorders of vagina: Secondary | ICD-10-CM

## 2015-05-13 DIAGNOSIS — L304 Erythema intertrigo: Secondary | ICD-10-CM | POA: Diagnosis not present

## 2015-05-13 MED ORDER — CLINDAMYCIN PHOSPHATE 1 % EX GEL: Freq: Two times a day (BID) | CUTANEOUS | Status: DC

## 2015-05-13 NOTE — Progress Notes (Signed)
HPI:  Vulvovaginitis: -thinks is yeast -started about 1 week ago -vulvovag white discharge, vulvovag pruritis -also felt a little crampy once and had a brownish discharge on OTC yeast suppository -did three day OTC treatment and now is doing better -denies: concern for STI, fevers, malaise, nausea, vomiting, dysuriah  Skin bump: -mon pubis region for a few days -gets bumps/pimple like lesions in this area frequntly -denies drainage, fevers, malaise  Skin rash: -in belly button -itchy  ROS: See pertinent positives and negatives per HPI.  Past Medical History  Diagnosis Date  . RLS (restless legs syndrome)   . Allergy   . Anemia   . Asthma   . Transfusion of blood during current hospitalization   . Hyperlipidemia   . History of nephrectomy     right - nonfunctioning kidney    Past Surgical History  Procedure Laterality Date  . Cesarean section  1986  . Nephrectomy Right 1975    Family History  Problem Relation Age of Onset  . Alcohol abuse Other   . Diabetes Other   . Hyperlipidemia Other   . Coronary artery disease Other   . Colon cancer Neg Hx     History   Social History  . Marital Status: Married    Spouse Name: N/A  . Number of Children: N/A  . Years of Education: N/A   Social History Main Topics  . Smoking status: Former Smoker    Quit date: 11/15/1983  . Smokeless tobacco: Never Used  . Alcohol Use: 0.6 oz/week    1 Cans of beer per week  . Drug Use: No  . Sexual Activity: Not on file   Other Topics Concern  . None   Social History Narrative     Current outpatient prescriptions:  .  albuterol (PROVENTIL HFA) 108 (90 BASE) MCG/ACT inhaler, Inhale 2 puffs into the lungs every 6 (six) hours as needed., Disp: 18 g, Rfl: 1 .  Calcium Carbonate-Vitamin D (CALCIUM + D PO), Take by mouth daily., Disp: , Rfl:  .  cyanocobalamin (,VITAMIN B-12,) 1000 MCG/ML injection, Inject 1 mL (1,000 mcg total) into the muscle every 30 (thirty) days., Disp:  30 mL, Rfl: 1 .  fluticasone (FLONASE) 50 MCG/ACT nasal spray, Place 2 sprays into both nostrils daily., Disp: 16 g, Rfl: 11 .  Multiple Vitamin (MULTIVITAMIN) tablet, Take 1 tablet by mouth daily., Disp: , Rfl:  .  simvastatin (ZOCOR) 40 MG tablet, Take 1 tablet (40 mg total) by mouth at bedtime., Disp: 100 tablet, Rfl: 3 .  clindamycin (CLINDAGEL) 1 % gel, Apply topically 2 (two) times daily., Disp: 30 g, Rfl: 1  EXAM:  Filed Vitals:   05/13/15 1107  BP: 108/78  Pulse: 82  Temp: 98.3 F (36.8 C)    Body mass index is 25.44 kg/(m^2).  GENERAL: vitals reviewed and listed above, alert, oriented, appears well hydrated and in no acute distress  HEENT: atraumatic, conjunttiva clear, no obvious abnormalities on inspection of external nose and ears  NECK: no obvious masses on inspection  LUNGS: clear to auscultation bilaterally, no wheezes, rales or rhonchi, good air movement  CV: HRRR, no peripheral edema  GU: normal exam of external genitalia except for a pustule on monspubis and several healed lesions in this area as well, normal exam of vaginal vault and cervix with homogenous white discharge that is minimal, no CMT  SKIN: mild erythema in area of umbilicus  MS: moves all extremities without noticeable abnormality  PSYCH: pleasant and cooperative,  no obvious depression or anxiety  ASSESSMENT AND PLAN:  Discussed the following assessment and plan:  Vaginal discharge - Plan: Cervicovaginal ancillary only  Intertrigo  Hydradenitis - Plan: clindamycin (CLINDAGEL) 1 % gel  -Patient advised to return or notify a doctor immediately if symptoms worsen or persist or new concerns arise.  Patient Instructions  FOR THE VULVOVAGINITIS: -it seems this is improving -no douching -We have ordered labs or studies at this visit. It can take up to 1-2 weeks for results and processing. We will contact you with instructions IF your results are abnormal. Normal results will be released to  your [facility]. If you have not heard from [place] or can not find your results in [facility] in 2 weeks please contact our office. -can use a probiotic such as Align or Culturelle for 4 weeks, this is available over the counter  For the bumps in the bikini area: -this may be a mild form of hydradenitis -can treat with compresses and the antibiotic gel provided and follow up as needed if worsening or persists  For the belly button: -please keep this area clean and dry -can use the yeast cream in this area 1-2 times daily for 1-2 weeks  -follow up if worsens or persists            KIM, HANNAH R.

## 2015-05-13 NOTE — Patient Instructions (Signed)
FOR THE VULVOVAGINITIS: -it seems this is improving -no douching -We have ordered labs or studies at this visit. It can take up to 1-2 weeks for results and processing. We will contact you with instructions IF your results are abnormal. Normal results will be released to your [facility]. If you have not heard from [place] or can not find your results in [facility] in 2 weeks please contact our office. -can use a probiotic such as Align or Culturelle for 4 weeks, this is available over the counter  For the bumps in the bikini area: -this may be a mild form of hydradenitis -can treat with compresses and the antibiotic gel provided and follow up as needed if worsening or persists  For the belly button: -please keep this area clean and dry -can use the yeast cream in this area 1-2 times daily for 1-2 weeks  -follow up if worsens or persists

## 2015-05-13 NOTE — Progress Notes (Signed)
Pre visit review using our clinic review tool, if applicable. No additional management support is needed unless otherwise documented below in the visit note. 

## 2015-05-20 ENCOUNTER — Telehealth: Payer: Self-pay | Admitting: *Deleted

## 2015-05-20 NOTE — Telephone Encounter (Signed)
Scott called back and stated they forward the speciman over to [facility].  Nothing further needed

## 2015-05-20 NOTE — Telephone Encounter (Signed)
Brooke Hansen, we have ordered this many times in the past, panel is what is usually done. Can you check that order was placed correctly? Thanks.

## 2015-05-20 NOTE — Telephone Encounter (Signed)
Scott called from [facility] [phone]) wanting to know which gardnerella test Dr Maudie Mercury wants as they do not have a separate test for this but they do have a panel only?

## 2015-05-20 NOTE — Telephone Encounter (Signed)
I think this may have been sent to [facility] by mistake.  I called Scott and he said that they can either send it over to [facility] or you can add a panel that includes the gardnerella and some other test that was not ordered.  I asked him to first check and see if they can send it to [facility].  He will call me back

## 2015-05-21 ENCOUNTER — Telehealth: Payer: Self-pay | Admitting: Family Medicine

## 2015-05-21 NOTE — Telephone Encounter (Signed)
Mary at [facility] called b/c they just received the specimen/order from 6/29 for pt. Specimen had come open and was spilled in the bag. They will have to refuse. Stanton Kidney states if you can get pt to come back in for a UA, they can do this test from a urine sample. Stanton Kidney would like a c/b to let her know what Dr Maudie Mercury would like to do.

## 2015-05-21 NOTE — Telephone Encounter (Signed)
I called the pt and informed her per Dr Maudie Mercury the vaginal swab will be more accurate to do in her case as she could have the urine test and it not show BV /and she scheduled an appt for Monday because she is out of town.  She questioned if she would be charged a copay for the office visit for this date since this was our error?

## 2015-05-21 NOTE — Telephone Encounter (Signed)
Left a message at the pts cell number to return my call. 
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
## 2015-05-22 NOTE — Telephone Encounter (Signed)
Per my prior note, please ensure pt not charged for lab error and lab dumping specimen. Thanks.

## 2015-05-22 NOTE — Telephone Encounter (Signed)
Per Dr Maudie Mercury the pt does not have to pay a copay on Monday and I called the pt and informed her of this.  Also noted this on the schedule.

## 2015-05-25 ENCOUNTER — Ambulatory Visit (INDEPENDENT_AMBULATORY_CARE_PROVIDER_SITE_OTHER): Payer: BC Managed Care – PPO | Admitting: Family Medicine

## 2015-05-25 ENCOUNTER — Other Ambulatory Visit (HOSPITAL_COMMUNITY)
Admission: RE | Admit: 2015-05-25 | Discharge: 2015-05-25 | Disposition: A | Discharge status: Home / Self Care | Payer: BC Managed Care – PPO | Source: Ambulatory Visit | Attending: Family Medicine | Admitting: Family Medicine

## 2015-05-25 ENCOUNTER — Other Ambulatory Visit: Payer: BC Managed Care – PPO

## 2015-05-25 DIAGNOSIS — IMO0002 Reserved for concepts with insufficient information to code with codable children: Secondary | ICD-10-CM

## 2015-05-25 DIAGNOSIS — N760 Acute vaginitis: Secondary | ICD-10-CM | POA: Diagnosis present

## 2015-05-25 DIAGNOSIS — Z113 Encounter for screening for infections with a predominantly sexual mode of transmission: Secondary | ICD-10-CM | POA: Diagnosis not present

## 2015-05-25 DIAGNOSIS — R69 Illness, unspecified: Secondary | ICD-10-CM
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
## 2015-05-25 NOTE — Addendum Note (Signed)
Addended by: Agnes Lawrence on: 05/25/2015 10:49 AM   Modules accepted: Orders

## 2015-05-26 LAB — CERVICOVAGINAL ANCILLARY ONLY
Chlamydia: NEGATIVE
NEISSERIA GONORRHEA: NEGATIVE
Trichomonas: NEGATIVE

## 2015-05-27 LAB — CERVICOVAGINAL ANCILLARY ONLY
Bacterial vaginitis: NEGATIVE
Candida vaginitis: NEGATIVE

## 2015-07-06 ENCOUNTER — Other Ambulatory Visit: Payer: Self-pay

## 2015-07-06 DIAGNOSIS — Z1231 Encounter for screening mammogram for malignant neoplasm of breast: Secondary | ICD-10-CM

## 2015-08-11 ENCOUNTER — Ambulatory Visit: Payer: BC Managed Care – PPO

## 2015-08-17 ENCOUNTER — Ambulatory Visit
Admission: RE | Admit: 2015-08-17 | Discharge: 2015-08-17 | Disposition: A | Discharge status: Home / Self Care | Payer: BC Managed Care – PPO | Source: Ambulatory Visit

## 2015-08-17 DIAGNOSIS — Z1231 Encounter for screening mammogram for malignant neoplasm of breast: Secondary | ICD-10-CM

## 2015-10-05 ENCOUNTER — Encounter: Payer: Self-pay | Admitting: Family Medicine

## 2015-10-05 ENCOUNTER — Ambulatory Visit (INDEPENDENT_AMBULATORY_CARE_PROVIDER_SITE_OTHER): Payer: BC Managed Care – PPO | Admitting: Family Medicine

## 2015-10-05 VITALS — BP 120/88 | Temp 98.4°F | Wt 147.0 lb

## 2015-10-05 DIAGNOSIS — R0789 Other chest pain: Secondary | ICD-10-CM | POA: Diagnosis not present

## 2015-10-05 NOTE — Progress Notes (Signed)
Pre visit review using our clinic review tool, if applicable. No additional management support is needed unless otherwise documented below in the visit note. 

## 2015-10-05 NOTE — Patient Instructions (Signed)
Motrin 600 mg twice daily with food when necessary
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
## 2015-11-02 ENCOUNTER — Other Ambulatory Visit: Payer: Self-pay | Admitting: Family Medicine

## 2015-12-07 ENCOUNTER — Ambulatory Visit (INDEPENDENT_AMBULATORY_CARE_PROVIDER_SITE_OTHER): Payer: BC Managed Care – PPO | Admitting: *Deleted

## 2015-12-07 DIAGNOSIS — Z23 Encounter for immunization: Secondary | ICD-10-CM | POA: Diagnosis not present

## 2016-01-04 ENCOUNTER — Encounter: Payer: Self-pay | Admitting: Family Medicine

## 2016-01-04 ENCOUNTER — Ambulatory Visit (INDEPENDENT_AMBULATORY_CARE_PROVIDER_SITE_OTHER): Payer: BC Managed Care – PPO | Admitting: Family Medicine

## 2016-01-04 VITALS — BP 110/80 | HR 95 | Temp 99.8°F | Ht 64.0 in | Wt 145.6 lb

## 2016-01-04 DIAGNOSIS — J4521 Mild intermittent asthma with (acute) exacerbation: Secondary | ICD-10-CM

## 2016-01-04 DIAGNOSIS — J309 Allergic rhinitis, unspecified: Secondary | ICD-10-CM | POA: Diagnosis not present

## 2016-01-04 DIAGNOSIS — J069 Acute upper respiratory infection, unspecified: Secondary | ICD-10-CM | POA: Diagnosis not present

## 2016-01-04 MED ORDER — ALBUTEROL SULFATE HFA 108 (90 BASE) MCG/ACT IN AERS: 2.0000 | INHALATION_SPRAY | Freq: Four times a day (QID) | RESPIRATORY_TRACT | Status: DC | PRN

## 2016-01-04 MED ORDER — BENZONATATE 100 MG PO CAPS: 100.0000 mg | ORAL_CAPSULE | Freq: Three times a day (TID) | ORAL | Status: DC | PRN

## 2016-01-04 MED ORDER — PREDNISONE 20 MG PO TABS: 40.0000 mg | ORAL_TABLET | Freq: Every day | ORAL | Status: DC

## 2016-01-04 NOTE — Patient Instructions (Signed)
Acute Bronchitis Bronchitis is inflammation of the airways that extend from the windpipe into the lungs (bronchi). The inflammation often causes mucus to develop. This leads to a cough, which is the most common symptom of bronchitis.  In acute bronchitis, the condition usually develops suddenly and goes away over time, usually in a couple weeks. Smoking, allergies, and asthma can make bronchitis worse. Repeated episodes of bronchitis may cause further lung problems.  CAUSES Acute bronchitis is most often caused by the same virus that causes a cold. The virus can spread from person to person (contagious) through coughing, sneezing, and touching contaminated objects. SIGNS AND SYMPTOMS   Cough.   Fever.   Coughing up mucus.   Body aches.   Chest congestion.   Chest tightness.  Chills.   Shortness of breath.   Sore throat.  DIAGNOSIS  Acute bronchitis is usually diagnosed through a physical exam. Your health care provider will also ask you questions about your medical history. Tests, such as chest X-rays, are sometimes done to rule out other conditions.  TREATMENT  Acute bronchitis usually goes away in a couple weeks. Oftentimes, no medical treatment is necessary. Medicines are sometimes given for relief of fever or cough. Antibiotic medicines are usually not needed but may be prescribed in certain situations. In some cases, an inhaler may be recommended to help reduce shortness of breath and control the cough. A cool mist vaporizer may also be used to help thin bronchial secretions and make it easier to clear the chest.  HOME CARE INSTRUCTIONS  Get plenty of rest.   Drink enough fluids to keep your urine clear or pale yellow (unless you have a medical condition that requires fluid restriction). Increasing fluids may help thin your respiratory secretions (sputum) and reduce chest congestion, and it will prevent dehydration.   Take medicines only as directed by your health  care provider.  If you were prescribed an antibiotic medicine, finish it all even if you start to feel better.  Avoid smoking and secondhand smoke. Exposure to cigarette smoke or irritating chemicals will make bronchitis worse. If you are a smoker, consider using nicotine gum or skin patches to help control withdrawal symptoms. Quitting smoking will help your lungs heal faster.   Reduce the chances of another bout of acute bronchitis by washing your hands frequently, avoiding people with cold symptoms, and trying not to touch your hands to your mouth, nose, or eyes.   Keep all follow-up visits as directed by your health care provider.  SEEK MEDICAL CARE IF: Your symptoms do not improve after 1 week of treatment.  SEEK IMMEDIATE MEDICAL CARE IF:  You develop an increased fever or chills.   You have chest pain.   You have severe shortness of breath.  You have bloody sputum.   You develop dehydration.  You faint or repeatedly feel like you are going to pass out.  You develop repeated vomiting.  You develop a severe headache. MAKE SURE YOU:   Understand these instructions.  Will watch your condition.  Will get help right away if you are not doing well or get worse.   This information is not intended to replace advice given to you by your health care provider. Make sure you discuss any questions you have with your health care provider.   Document Released: 12/08/2004 Document Revised: 11/21/2014 Document Reviewed: 04/23/2013 Elsevier Interactive Patient Education [employer].

## 2016-01-04 NOTE — Progress Notes (Signed)
HPI:  URI/Asthma -started: a few days ago -symptoms:nasal congestion, sore throat, cough, chest tightness and wheezy sensation, increased alb use -denies:fever, chills, malaise, body aches, NVD, tooth pain -has tried: alb -sick contacts/travel/risks: denies flu exposure, grandson with the same and dx with VURI -Hx of: allergies and asthma - uses flonase and albuterol prn -reports despite listed bactrim allergy can take prednisone and tessalon - reports not even sure had allergy to septra as was taking several things, was a teenager and cause nausea - no severe symptoms  ROS: See pertinent positives and negatives per HPI.  Past Medical History  Diagnosis Date  . RLS (restless legs syndrome)   . Allergy   . Anemia   . Asthma   . Transfusion of blood during current hospitalization   . Hyperlipidemia   . History of nephrectomy     right - nonfunctioning kidney    Past Surgical History  Procedure Laterality Date  . Cesarean section  1986  . Nephrectomy Right 1975    Family History  Problem Relation Age of Onset  . Alcohol abuse Other   . Diabetes Other   . Hyperlipidemia Other   . Coronary artery disease Other   . Colon cancer Neg Hx     Social History   Social History  . Marital Status: Married    Spouse Name: N/A  . Number of Children: N/A  . Years of Education: N/A   Social History Main Topics  . Smoking status: Former Smoker    Quit date: 11/15/1983  . Smokeless tobacco: Never Used  . Alcohol Use: 0.6 oz/week    1 Cans of beer per week  . Drug Use: No  . Sexual Activity: Not Asked   Other Topics Concern  . None   Social History Narrative     Current outpatient prescriptions:  .  albuterol (PROVENTIL HFA) 108 (90 Base) MCG/ACT inhaler, Inhale 2 puffs into the lungs every 6 (six) hours as needed., Disp: 18 g, Rfl: 0 .  Calcium Carbonate-Vitamin D (CALCIUM + D PO), Take by mouth daily., Disp: , Rfl:  .  clindamycin (CLINDAGEL) 1 % gel, Apply  topically 2 (two) times daily., Disp: 30 g, Rfl: 1 .  cyanocobalamin (,VITAMIN B-12,) 1000 MCG/ML injection, Inject 1 mL (1,000 mcg total) into the muscle every 30 (thirty) days., Disp: 30 mL, Rfl: 1 .  fluticasone (FLONASE) 50 MCG/ACT nasal spray, Place 2 sprays into both nostrils daily., Disp: 16 g, Rfl: 11 .  Multiple Vitamin (MULTIVITAMIN) tablet, Take 1 tablet by mouth daily., Disp: , Rfl:  .  simvastatin (ZOCOR) 40 MG tablet, TAKE 1 TABLET (40 MG TOTAL) BY MOUTH AT BEDTIME., Disp: 90 tablet, Rfl: 3 .  benzonatate (TESSALON PERLES) 100 MG capsule, Take 1 capsule (100 mg total) by mouth 3 (three) times daily as needed., Disp: 20 capsule, Rfl: 0 .  predniSONE (DELTASONE) 20 MG tablet, Take 2 tablets (40 mg total) by mouth daily with breakfast., Disp: 8 tablet, Rfl: 0  EXAM:  Filed Vitals:   01/04/16 1034  BP: 110/80  Pulse: 95  Temp: 99.8 F (37.7 C)    Body mass index is 24.98 kg/(m^2).  GENERAL: vitals reviewed and listed above, alert, oriented, appears well hydrated and in no acute distress  HEENT: atraumatic, conjunttiva clear, no obvious abnormalities on inspection of external nose and ears, normal appearance of ear canals and TMs, clear nasal congestion, mild post oropharyngeal erythema with PND, no tonsillar edema or exudate, no sinus TTP  NECK: no obvious masses on inspection  LUNGS: clear to auscultation bilaterally, no wheezes, rales or rhonchi, good air movement  CV: HRRR, no peripheral edema  MS: moves all extremities without noticeable abnormality  PSYCH: pleasant and cooperative, no obvious depression or anxiety  ASSESSMENT AND PLAN:  Discussed the following assessment and plan:  Acute upper respiratory infection  Asthma, mild intermittent, with acute exacerbation - Plan: albuterol (PROVENTIL HFA) 108 (90 Base) MCG/ACT inhaler  Allergic rhinitis, unspecified allergic rhinitis type - Plan: albuterol (PROVENTIL HFA) 108 (90 Base) MCG/ACT inhaler  -given HPI  and exam findings today, a serious infection or illness is unlikely. We discussed potential etiologies, with VURI being most likely with aasthma exacerbation. We discussed treatment side effects, likely course, allergies, medication reactions, antibiotic misuse, transmission, and signs of developing a serious illness. -of course, we advised to return or notify a doctor immediately if symptoms worsen or persist or new concerns arise.    Patient Instructions  Acute Bronchitis Bronchitis is inflammation of the airways that extend from the windpipe into the lungs (bronchi). The inflammation often causes mucus to develop. This leads to a cough, which is the most common symptom of bronchitis.  In acute bronchitis, the condition usually develops suddenly and goes away over time, usually in a couple weeks. Smoking, allergies, and asthma can make bronchitis worse. Repeated episodes of bronchitis may cause further lung problems.  CAUSES Acute bronchitis is most often caused by the same virus that causes a cold. The virus can spread from person to person (contagious) through coughing, sneezing, and touching contaminated objects. SIGNS AND SYMPTOMS   Cough.   Fever.   Coughing up mucus.   Body aches.   Chest congestion.   Chest tightness.  Chills.   Shortness of breath.   Sore throat.  DIAGNOSIS  Acute bronchitis is usually diagnosed through a physical exam. Your health care provider will also ask you questions about your medical history. Tests, such as chest X-rays, are sometimes done to rule out other conditions.  TREATMENT  Acute bronchitis usually goes away in a couple weeks. Oftentimes, no medical treatment is necessary. Medicines are sometimes given for relief of fever or cough. Antibiotic medicines are usually not needed but may be prescribed in certain situations. In some cases, an inhaler may be recommended to help reduce shortness of breath and control the cough. A cool mist  vaporizer may also be used to help thin bronchial secretions and make it easier to clear the chest.  HOME CARE INSTRUCTIONS  Get plenty of rest.   Drink enough fluids to keep your urine clear or pale yellow (unless you have a medical condition that requires fluid restriction). Increasing fluids may help thin your respiratory secretions (sputum) and reduce chest congestion, and it will prevent dehydration.   Take medicines only as directed by your health care provider.  If you were prescribed an antibiotic medicine, finish it all even if you start to feel better.  Avoid smoking and secondhand smoke. Exposure to cigarette smoke or irritating chemicals will make bronchitis worse. If you are a smoker, consider using nicotine gum or skin patches to help control withdrawal symptoms. Quitting smoking will help your lungs heal faster.   Reduce the chances of another bout of acute bronchitis by washing your hands frequently, avoiding people with cold symptoms, and trying not to touch your hands to your mouth, nose, or eyes.   Keep all follow-up visits as directed by your health care provider.  [facility]  CARE IF: Your symptoms do not improve after 1 week of treatment.  SEEK IMMEDIATE MEDICAL CARE IF:  You develop an increased fever or chills.   You have chest pain.   You have severe shortness of breath.  You have bloody sputum.   You develop dehydration.  You faint or repeatedly feel like you are going to pass out.  You develop repeated vomiting.  You develop a severe headache. MAKE SURE YOU:   Understand these instructions.  Will watch your condition.  Will get help right away if you are not doing well or get worse.   This information is not intended to replace advice given to you by your health care provider. Make sure you discuss any questions you have with your health care provider.   Document Released: 12/08/2004 Document Revised: 11/21/2014 Document Reviewed:  04/23/2013 Elsevier Interactive Patient Education 2016 [place], [place]

## 2016-01-04 NOTE — Progress Notes (Signed)
Pre visit review using our clinic review tool, if applicable. No additional management support is needed unless otherwise documented below in the visit note. 

## 2016-02-15 ENCOUNTER — Other Ambulatory Visit (INDEPENDENT_AMBULATORY_CARE_PROVIDER_SITE_OTHER): Payer: BC Managed Care – PPO

## 2016-02-15 DIAGNOSIS — Z0000 Encounter for general adult medical examination without abnormal findings: Secondary | ICD-10-CM

## 2016-02-15 LAB — CBC WITH DIFFERENTIAL/PLATELET
BASOS PCT: 0.8 % (ref 0.0–3.0)
Basophils Absolute: 0.1 10*3/uL (ref 0.0–0.1)
EOS PCT: 5.8 % — AB (ref 0.0–5.0)
Eosinophils Absolute: 0.4 10*3/uL (ref 0.0–0.7)
HCT: 40.4 % (ref 36.0–46.0)
Hemoglobin: 13.3 g/dL (ref 12.0–15.0)
LYMPHS ABS: 1.7 10*3/uL (ref 0.7–4.0)
Lymphocytes Relative: 25.3 % (ref 12.0–46.0)
MCHC: 32.9 g/dL (ref 30.0–36.0)
MCV: 90.5 fl (ref 78.0–100.0)
MONO ABS: 0.8 10*3/uL (ref 0.1–1.0)
Monocytes Relative: 11.7 % (ref 3.0–12.0)
NEUTROS ABS: 3.7 10*3/uL (ref 1.4–7.7)
NEUTROS PCT: 56.4 % (ref 43.0–77.0)
Platelets: 254 10*3/uL (ref 150.0–400.0)
RBC: 4.47 Mil/uL (ref 3.87–5.11)
RDW: 14.4 % (ref 11.5–15.5)
WBC: 6.6 10*3/uL (ref 4.0–10.5)

## 2016-02-15 LAB — BASIC METABOLIC PANEL
BUN: 21 mg/dL (ref 6–23)
CHLORIDE: 103 meq/L (ref 96–112)
CO2: 31 mEq/L (ref 19–32)
Calcium: 10.3 mg/dL (ref 8.4–10.5)
Creatinine, Ser: 0.96 mg/dL (ref 0.40–1.20)
GFR: 62.67 mL/min (ref >60.00)
GLUCOSE: 92 mg/dL (ref 70–99)
POTASSIUM: 4.8 meq/L (ref 3.5–5.1)
Sodium: 140 mEq/L (ref 135–145)

## 2016-02-15 LAB — POC URINALSYSI DIPSTICK (AUTOMATED)
BILIRUBIN UA: NEGATIVE
Glucose, UA: NEGATIVE
KETONES UA: NEGATIVE
LEUKOCYTES UA: NEGATIVE
NITRITE UA: NEGATIVE
PROTEIN UA: NEGATIVE
Spec Grav, UA: 1.025
Urobilinogen, UA: 0.2
pH, UA: 5

## 2016-02-15 LAB — LIPID PANEL
CHOLESTEROL: 203 mg/dL — AB (ref 0–200)
HDL: 63 mg/dL (ref >39.00)
LDL CALC: 125 mg/dL — AB (ref 0–99)
NonHDL: 139.96
TRIGLYCERIDES: 77 mg/dL (ref 0.0–149.0)
Total CHOL/HDL Ratio: 3
VLDL: 15.4 mg/dL (ref 0.0–40.0)

## 2016-02-15 LAB — HEPATIC FUNCTION PANEL
ALT: 14 U/L (ref 0–35)
AST: 19 U/L (ref 0–37)
Albumin: 4.2 g/dL (ref 3.5–5.2)
Alkaline Phosphatase: 73 U/L (ref 39–117)
BILIRUBIN TOTAL: 0.4 mg/dL (ref 0.2–1.2)
Bilirubin, Direct: 0.1 mg/dL (ref 0.0–0.3)
Total Protein: 6.8 g/dL (ref 6.0–8.3)

## 2016-02-15 LAB — TSH: TSH: 2.09 u[IU]/mL (ref 0.35–4.50)

## 2016-02-22 ENCOUNTER — Encounter: Payer: Self-pay | Admitting: Family Medicine

## 2016-02-22 ENCOUNTER — Ambulatory Visit (INDEPENDENT_AMBULATORY_CARE_PROVIDER_SITE_OTHER): Payer: BC Managed Care – PPO | Admitting: Family Medicine

## 2016-02-22 ENCOUNTER — Other Ambulatory Visit (HOSPITAL_COMMUNITY)
Admission: RE | Admit: 2016-02-22 | Discharge: 2016-02-22 | Disposition: A | Discharge status: Home / Self Care | Payer: BC Managed Care – PPO | Source: Ambulatory Visit | Attending: Family Medicine | Admitting: Family Medicine

## 2016-02-22 VITALS — BP 120/88 | Temp 98.4°F | Ht 63.5 in | Wt 144.0 lb

## 2016-02-22 DIAGNOSIS — Z0000 Encounter for general adult medical examination without abnormal findings: Secondary | ICD-10-CM | POA: Diagnosis not present

## 2016-02-22 DIAGNOSIS — D510 Vitamin B12 deficiency anemia due to intrinsic factor deficiency: Secondary | ICD-10-CM | POA: Diagnosis not present

## 2016-02-22 DIAGNOSIS — N952 Postmenopausal atrophic vaginitis: Secondary | ICD-10-CM | POA: Diagnosis not present

## 2016-02-22 DIAGNOSIS — Z01411 Encounter for gynecological examination (general) (routine) with abnormal findings: Secondary | ICD-10-CM | POA: Insufficient documentation

## 2016-02-22 DIAGNOSIS — E785 Hyperlipidemia, unspecified: Secondary | ICD-10-CM

## 2016-02-22 DIAGNOSIS — Z1151 Encounter for screening for human papillomavirus (HPV): Secondary | ICD-10-CM | POA: Insufficient documentation

## 2016-02-22 MED ORDER — ESTRADIOL 0.1 MG/GM VA CREA: 1.0000 | TOPICAL_CREAM | Freq: Every day | VAGINAL | Status: DC

## 2016-02-22 MED ORDER — SIMVASTATIN 40 MG PO TABS: ORAL_TABLET | ORAL | Status: DC

## 2016-02-22 MED ORDER — CYANOCOBALAMIN 1000 MCG/ML IJ SOLN: 1000.0000 ug | INTRAMUSCULAR | Status: DC

## 2016-02-22 NOTE — Patient Instructions (Signed)
Continue current medications  For the plantar fasciitis............. cotton socks and sneakers....... did not go barefooted.......Marland Kitchen Motrin 600 twice daily with food........ daily stretching in the morning..... Ice at bedtime  If after couple weeks the pain persists call [place] and we will get you set up to see a physical therapist  Continue good diet exercise program  Follow-up in one year sooner if any problems

## 2016-02-22 NOTE — Progress Notes (Signed)
Subjective:    Patient ID: Brooke Hansen, female    DOB: [birth year], 60 y.o.   MRN: [id number]  HPI Brooke Hansen is a 60 year old single female nonsmoker who comes in today for general physical examination  She has a history of hyperlipidemia and takes Zocor 40 mg daily. She can't tolerate aspirin causes bruising  She takes vitamin B12 1 mL monthly because of a history of B12 deficiency  She has allergic rhinitis for which he takes over-the-counter antihistamines steroid nasal spray. Occasion when she gets a bad cold or allergies get real bad she'll wheeze and uses albuterol. She'll use a couple times a year.  She has a lesion in her left lower lip she would like checked. She also notes her hands go to sleep when she sleeps at night. She exercises on a daily basis without chest pain or shortness of breath. She's also had some plantar fasciitis and right heel for the last 6 months.  She gets routine eye care, dental care, BSE monthly, annual mammography, colonoscopy 2016 normal  Last Pap was 2014. She's never had any trouble with her Paps or any GYN problems. No bloating etc. She is complaining of vaginal dryness and one time he some hormonal cream. She is Premarin and says it causes some itching.  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   HENT: Negative.   Eyes: Negative.   Respiratory: Negative.   Cardiovascular: Negative.   Gastrointestinal: Negative.   Endocrine: Negative.   Genitourinary: Negative.   Musculoskeletal: Negative.   Skin: Negative.   Allergic/Immunologic: Negative.   Neurological: Negative.   Hematological: Negative.   Psychiatric/Behavioral: Negative.        Objective:   Physical Exam  Constitutional: She appears well-developed and well-nourished.  HENT:  Head: Normocephalic and atraumatic.    Right Ear: External ear normal.  Left Ear: External ear normal.  Nose: Nose normal.  Mouth/Throat: Oropharynx is clear and moist.  Eyes: EOM are normal. Pupils are equal, round, and reactive to light.  Neck: Normal range of motion. Neck supple. No JVD present. No tracheal deviation present. No thyromegaly present.  Cardiovascular: Normal rate, regular rhythm, normal heart sounds and intact distal pulses.  Exam reveals no gallop and no friction rub.   No murmur heard. No carotid nor aortic bruits peripheral pulses 2+ and symmetrical. Her pulses did not disappear when she raises her arms over her head  Pulmonary/Chest: Effort normal and breath sounds normal. No stridor. No respiratory distress. She has no wheezes. She has no rales. She exhibits no tenderness.  Abdominal: Soft. Bowel sounds are normal. She exhibits no distension and no mass. There is no tenderness. There is no rebound and no guarding.  Genitourinary: Vagina normal and uterus normal. Guaiac negative stool. No vaginal discharge found.  Bilateral breast exam normal  Extreme vaginal dryness  Musculoskeletal: Normal range of motion.  Lymphadenopathy:    She has no cervical adenopathy.  Neurological: She is alert. She has normal reflexes. No cranial nerve deficit. She exhibits normal muscle tone. Coordination normal.  Skin: Skin is warm and dry. No rash noted. No erythema. No pallor.  Total body skin exam normal of the lesion left lower lip is a pigmented lesion benign  Psychiatric: She has a normal mood and affect. Her behavior is normal. Judgment and thought content normal.  Nursing note and  vitals reviewed.         Assessment & Plan:  Healthy female  Hyperlipidemia....... continue Lipitor  Postmenopausal vaginal dryness.......... sensitive Premarin cream trial of Estrace  Allergic rhinitis continue over-the-counter antihistamines steroid nasal spray  Vitamin B12 deficiency...Marland KitchenMarland KitchenMarland Kitchen continue vitamin B12 1 mL monthly

## 2016-02-22 NOTE — Progress Notes (Signed)
Pre visit review using our clinic review tool, if applicable. No additional management support is needed unless otherwise documented below in the visit note. 

## 2016-02-23 LAB — CYTOLOGY - PAP

## 2016-02-24 ENCOUNTER — Other Ambulatory Visit: Payer: Self-pay | Admitting: Family Medicine

## 2016-07-25 ENCOUNTER — Other Ambulatory Visit: Payer: Self-pay | Admitting: Family Medicine

## 2016-07-25 DIAGNOSIS — Z1231 Encounter for screening mammogram for malignant neoplasm of breast: Secondary | ICD-10-CM

## 2016-08-19 ENCOUNTER — Ambulatory Visit: Payer: BC Managed Care – PPO

## 2016-08-29 ENCOUNTER — Ambulatory Visit
Admission: RE | Admit: 2016-08-29 | Discharge: 2016-08-29 | Disposition: A | Discharge status: Home / Self Care | Payer: BC Managed Care – PPO | Source: Ambulatory Visit | Attending: Family Medicine | Admitting: Family Medicine

## 2016-08-29 DIAGNOSIS — Z1231 Encounter for screening mammogram for malignant neoplasm of breast: Secondary | ICD-10-CM

## 2017-01-20 ENCOUNTER — Encounter: Payer: Self-pay | Admitting: Family Medicine

## 2017-01-20 ENCOUNTER — Ambulatory Visit (INDEPENDENT_AMBULATORY_CARE_PROVIDER_SITE_OTHER): Payer: BC Managed Care – PPO | Admitting: Family Medicine

## 2017-01-20 VITALS — BP 110/70 | HR 94 | Temp 99.2°F | Ht 63.5 in | Wt 144.8 lb

## 2017-01-20 DIAGNOSIS — J989 Respiratory disorder, unspecified: Secondary | ICD-10-CM

## 2017-01-20 DIAGNOSIS — J4521 Mild intermittent asthma with (acute) exacerbation: Secondary | ICD-10-CM | POA: Diagnosis not present

## 2017-01-20 DIAGNOSIS — J309 Allergic rhinitis, unspecified: Secondary | ICD-10-CM

## 2017-01-20 MED ORDER — BENZONATATE 100 MG PO CAPS: 100.0000 mg | ORAL_CAPSULE | Freq: Two times a day (BID) | ORAL | 0 refills | Status: DC | PRN

## 2017-01-20 MED ORDER — PREDNISONE 20 MG PO TABS: 40.0000 mg | ORAL_TABLET | Freq: Every day | ORAL | 0 refills | Status: DC

## 2017-01-20 NOTE — Patient Instructions (Signed)
Please take the prednisone as prescribed.  Tessalon and albuterol as needed for cough.  Consider restarting your allergy regimen.  I hope you are feeling better soon! Seek care immediately if worsening, new concerns or you are not improving with treatment.

## 2017-01-20 NOTE — Progress Notes (Signed)
Pre visit review using our clinic review tool, if applicable. No additional management support is needed unless otherwise documented below in the visit note. 

## 2017-01-20 NOTE — Progress Notes (Signed)
HPI:  Acute visit for URI and asthma: -started: 3 days ago -symptoms:nasal congestion, sore throat, cough, drainage, wheezing, increased alb use to 3x per day -denies:fever, SOB, NVD, tooth pain, sinus pain, body aches, rash -has tried: albuterol -sick contacts/travel/risks: no reported flu, strep or tick exposure -Hx of: allergies - not taking allergy meds; mild int asthma  ROS: See pertinent positives and negatives per HPI.  Past Medical History:  Diagnosis Date  . Allergy   . Anemia   . Asthma   . History of nephrectomy    right - nonfunctioning kidney  . Hyperlipidemia   . RLS (restless legs syndrome)   . Transfusion of blood during current hospitalization     Past Surgical History:  Procedure Laterality Date  . CESAREAN SECTION  1986  . NEPHRECTOMY Right 1975    Family History  Problem Relation Age of Onset  . Alcohol abuse Other   . Diabetes Other   . Hyperlipidemia Other   . Coronary artery disease Other   . Colon cancer Neg Hx     Social History   Social History  . Marital status: Married    Spouse name: N/A  . Number of children: N/A  . Years of education: N/A   Social History Main Topics  . Smoking status: Former Smoker    Quit date: 11/15/1983  . Smokeless tobacco: Never Used  . Alcohol use 0.6 oz/week    1 Cans of beer per week  . Drug use: No  . Sexual activity: Not Asked   Other Topics Concern  . None   Social History Narrative  . None     Current Outpatient Prescriptions:  .  albuterol (PROVENTIL HFA) 108 (90 Base) MCG/ACT inhaler, Inhale 2 puffs into the lungs every 6 (six) hours as needed., Disp: 18 g, Rfl: 0 .  Calcium Carbonate-Vitamin D (CALCIUM + D PO), Take by mouth daily., Disp: , Rfl:  .  cyanocobalamin (,VITAMIN B-12,) 1000 MCG/ML injection, Inject 1 mL (1,000 mcg total) into the muscle every 30 (thirty) days., Disp: 30 mL, Rfl: 1 .  estradiol (ESTRACE VAGINAL) 0.1 MG/GM vaginal cream, Place 1 Applicatorful vaginally at  bedtime., Disp: 42.5 g, Rfl: 12 .  fluticasone (FLONASE) 50 MCG/ACT nasal spray, PLACE 2 SPRAYS IN EACH NOSTRIL DAILY., Disp: 16 g, Rfl: 11 .  simvastatin (ZOCOR) 40 MG tablet, TAKE 1 TABLET (40 MG TOTAL) BY MOUTH AT BEDTIME., Disp: 100 tablet, Rfl: 4 .  benzonatate (TESSALON) 100 MG capsule, Take 1 capsule (100 mg total) by mouth 2 (two) times daily as needed for cough., Disp: 20 capsule, Rfl: 0 .  predniSONE (DELTASONE) 20 MG tablet, Take 2 tablets (40 mg total) by mouth daily with breakfast., Disp: 8 tablet, Rfl: 0  EXAM:  Vitals:   01/20/17 1027  BP: 110/70  Pulse: 94  Temp: 99.2 F (37.3 C)    Body mass index is 25.25 kg/m.  GENERAL: vitals reviewed and listed above, alert, oriented, appears well hydrated and in no acute distress  HEENT: atraumatic, conjunttiva clear, no obvious abnormalities on inspection of external nose and ears, normal appearance of ear canals and TMs, clear nasal congestion, mild post oropharyngeal erythema with PND, no tonsillar edema or exudate, no sinus TTP  NECK: no obvious masses on inspection  LUNGS: clear to auscultation bilaterally, no wheezes, rales or rhonchi, good air movement  CV: HRRR, no peripheral edema  MS: moves all extremities without noticeable abnormality  PSYCH: pleasant and cooperative, no obvious depression  or anxiety  ASSESSMENT AND PLAN:  Discussed the following assessment and plan:  Respiratory illness  Mild intermittent asthma with acute exacerbation  Chronic allergic rhinitis, unspecified seasonality, unspecified trigger  -given HPI and exam findings today, a serious infection or illness is unlikely. We discussed potential etiologies, with VURI or seasonal allergies with asthma exacerbation being most likely. Prednisone, allergy regimen, tessalon, return precautions, alb prn. We discussed treatment side effects, likely course, antibiotic misuse, transmission, and signs of developing a serious illness. -of course, we  advised to return or notify a doctor immediately if symptoms worsen or persist or new concerns arise.    Patient Instructions  Please take the prednisone as prescribed.  Tessalon and albuterol as needed for cough.  Consider restarting your allergy regimen.  I hope you are feeling better soon! Seek care immediately if worsening, new concerns or you are not improving with treatment.     Colin Benton R., DO

## 2017-01-30 ENCOUNTER — Ambulatory Visit: Payer: BC Managed Care – PPO | Admitting: Family Medicine

## 2017-03-03 ENCOUNTER — Other Ambulatory Visit: Payer: Self-pay | Admitting: Family Medicine

## 2017-03-03 DIAGNOSIS — D510 Vitamin B12 deficiency anemia due to intrinsic factor deficiency: Secondary | ICD-10-CM

## 2017-03-10 ENCOUNTER — Other Ambulatory Visit: Payer: Self-pay | Admitting: Family Medicine

## 2017-03-10 DIAGNOSIS — E785 Hyperlipidemia, unspecified: Secondary | ICD-10-CM

## 2017-03-10 DIAGNOSIS — D649 Anemia, unspecified: Secondary | ICD-10-CM

## 2017-03-10 DIAGNOSIS — R5383 Other fatigue: Secondary | ICD-10-CM

## 2017-03-13 ENCOUNTER — Other Ambulatory Visit (INDEPENDENT_AMBULATORY_CARE_PROVIDER_SITE_OTHER): Payer: BC Managed Care – PPO

## 2017-03-13 DIAGNOSIS — E785 Hyperlipidemia, unspecified: Secondary | ICD-10-CM | POA: Diagnosis not present

## 2017-03-13 DIAGNOSIS — D649 Anemia, unspecified: Secondary | ICD-10-CM | POA: Diagnosis not present

## 2017-03-13 DIAGNOSIS — R5383 Other fatigue: Secondary | ICD-10-CM

## 2017-03-13 LAB — CBC WITH DIFFERENTIAL/PLATELET
BASOS PCT: 0.5 % (ref 0.0–3.0)
Basophils Absolute: 0 10*3/uL (ref 0.0–0.1)
EOS PCT: 4.4 % (ref 0.0–5.0)
Eosinophils Absolute: 0.3 10*3/uL (ref 0.0–0.7)
HCT: 41.4 % (ref 36.0–46.0)
Hemoglobin: 13.6 g/dL (ref 12.0–15.0)
Lymphocytes Relative: 29.2 % (ref 12.0–46.0)
Lymphs Abs: 1.7 10*3/uL (ref 0.7–4.0)
MCHC: 32.8 g/dL (ref 30.0–36.0)
MCV: 91.9 fl (ref 78.0–100.0)
Monocytes Absolute: 0.7 10*3/uL (ref 0.1–1.0)
Monocytes Relative: 11 % (ref 3.0–12.0)
NEUTROS ABS: 3.3 10*3/uL (ref 1.4–7.7)
NEUTROS PCT: 54.9 % (ref 43.0–77.0)
PLATELETS: 221 10*3/uL (ref 150.0–400.0)
RBC: 4.51 Mil/uL (ref 3.87–5.11)
RDW: 15 % (ref 11.5–15.5)
WBC: 6 10*3/uL (ref 4.0–10.5)

## 2017-03-13 LAB — HEPATIC FUNCTION PANEL
ALT: 14 U/L (ref 0–35)
AST: 18 U/L (ref 0–37)
Albumin: 4.1 g/dL (ref 3.5–5.2)
Alkaline Phosphatase: 67 U/L (ref 39–117)
BILIRUBIN DIRECT: 0.1 mg/dL (ref 0.0–0.3)
BILIRUBIN TOTAL: 0.4 mg/dL (ref 0.2–1.2)
TOTAL PROTEIN: 6.6 g/dL (ref 6.0–8.3)

## 2017-03-13 LAB — LIPID PANEL
Cholesterol: 198 mg/dL (ref 0–200)
HDL: 68.6 mg/dL (ref >39.00)
LDL CALC: 112 mg/dL — AB (ref 0–99)
NONHDL: 129.49
Total CHOL/HDL Ratio: 3
Triglycerides: 86 mg/dL (ref 0.0–149.0)
VLDL: 17.2 mg/dL (ref 0.0–40.0)

## 2017-03-13 LAB — BASIC METABOLIC PANEL
BUN: 20 mg/dL (ref 6–23)
CO2: 30 mEq/L (ref 19–32)
Calcium: 10 mg/dL (ref 8.4–10.5)
Chloride: 103 mEq/L (ref 96–112)
Creatinine, Ser: 0.98 mg/dL (ref 0.40–1.20)
GFR: 60.98 mL/min (ref >60.00)
GLUCOSE: 103 mg/dL — AB (ref 70–99)
POTASSIUM: 4.2 meq/L (ref 3.5–5.1)
SODIUM: 139 meq/L (ref 135–145)

## 2017-03-13 LAB — TSH: TSH: 1.39 u[IU]/mL (ref 0.35–4.50)

## 2017-03-20 ENCOUNTER — Ambulatory Visit (INDEPENDENT_AMBULATORY_CARE_PROVIDER_SITE_OTHER): Payer: BC Managed Care – PPO | Admitting: Family Medicine

## 2017-03-20 ENCOUNTER — Encounter: Payer: Self-pay | Admitting: Family Medicine

## 2017-03-20 VITALS — BP 118/70 | Temp 98.4°F | Ht 63.5 in | Wt 142.0 lb

## 2017-03-20 DIAGNOSIS — E785 Hyperlipidemia, unspecified: Secondary | ICD-10-CM

## 2017-03-20 DIAGNOSIS — N952 Postmenopausal atrophic vaginitis: Secondary | ICD-10-CM

## 2017-03-20 DIAGNOSIS — Z0000 Encounter for general adult medical examination without abnormal findings: Secondary | ICD-10-CM

## 2017-03-20 DIAGNOSIS — J309 Allergic rhinitis, unspecified: Secondary | ICD-10-CM

## 2017-03-20 DIAGNOSIS — Z136 Encounter for screening for cardiovascular disorders: Secondary | ICD-10-CM | POA: Insufficient documentation

## 2017-03-20 DIAGNOSIS — Z23 Encounter for immunization: Secondary | ICD-10-CM

## 2017-03-20 MED ORDER — ALBUTEROL SULFATE HFA 108 (90 BASE) MCG/ACT IN AERS: 2.0000 | INHALATION_SPRAY | Freq: Four times a day (QID) | RESPIRATORY_TRACT | 1 refills | Status: DC | PRN

## 2017-03-20 MED ORDER — FLUTICASONE PROPIONATE 50 MCG/ACT NA SUSP: NASAL | 11 refills | Status: DC

## 2017-03-20 MED ORDER — ESTRADIOL 0.1 MG/GM VA CREA: 1.0000 | TOPICAL_CREAM | VAGINAL | 12 refills | Status: DC

## 2017-03-20 MED ORDER — SIMVASTATIN 40 MG PO TABS: ORAL_TABLET | ORAL | 4 refills | Status: DC

## 2017-03-20 NOTE — Patient Instructions (Signed)
Continue current medications  Return in one year sooner if any problems  [email or web address]

## 2017-03-20 NOTE — Progress Notes (Signed)
Brooke Hansen is a delightful 61 year old female nonsmoker who comes in today for general physical examination because of a history of hyperlipidemia, allergic rhinitis, postmenopausal vaginal dryness, B12 deficiency, allergic asthma.  She takes Zocor and baby aspirin 40 mg daily for hyperlipidemia. LDL is 112  She takes Flonase for allergic rhinitis  She uses Estrace cream when necessary for vaginal dryness. I encouraged to use it at least twice weekly  She takes 1 mL of vitamin B12 monthly because of a history of pernicious anemia  She is albuterol when necessary which she has a flare of her asthma from underlying allergies. The albuterol need is very rare.  She has some hearing loss in her right ear went to ENT was told was viral.  She gets routine eye care, dental care, BSE monthly, annual mammography, colonoscopy 2016 normal  Vaccinations up-to-date she's going to get her second shingles vaccine today.  Family history dad died of lung cancer is a smoker mother is currently in rehabilitation. She had a urinary tract infection in the fall. She's coming home to live with her daughter. 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  14 point you systems reviewed and otherwise negative  EK week G was done because history of hyperlipidemia. EKG was normal no changes from previous cardiogram  No GYN symptoms last Pap smear was 2017 normal.  BP 118/70 (BP Location: Left Arm, Patient Position: Sitting, Cuff Size: Normal)   Temp 98.4 F (36.9 C) (Oral)   Ht 5' 3.5" (1.613 m)   Wt 142 lb (64.4 kg)   BMI 24.76 kg/m  examination the HEENT were negative thyroid is not enlarged no carotid bruits cardiopulmonary exam normal breast exam normal abdominal exam normal pelvic and rectal deferred extremities normal skin normal peripheral pulses normal  #1 hyperlipidemia  goal,,,,,,,,,, continue current therapy  #2 allergic rhinitis,,,,,,,,, continue Flonase  #3 postmenopausal vaginal dryness,,,,,,,,, Estrace cream twice weekly  #4 vitamin B12 deficiency,,,,,,,,, continue vitamin B12 1 mL monthly  #5 history of occasional allergy induced asthma,,,,,,,,,, continue albuterol when necessary,, .

## 2017-03-20 NOTE — Progress Notes (Signed)
Pre visit review using our clinic review tool, if applicable. No additional management support is needed unless otherwise documented below in the visit note. 

## 2017-05-29 ENCOUNTER — Ambulatory Visit (INDEPENDENT_AMBULATORY_CARE_PROVIDER_SITE_OTHER): Payer: BC Managed Care – PPO | Admitting: *Deleted

## 2017-05-29 DIAGNOSIS — Z23 Encounter for immunization: Secondary | ICD-10-CM | POA: Diagnosis not present

## 2017-05-29 NOTE — Progress Notes (Signed)
Patient here for second shingrix vaccine; first dose given on 03/20/17; administered IM to left deltoid; patient tolerated well; no s/s of reactions; patient educated on potential side effects; understanding voiced.

## 2017-07-28 ENCOUNTER — Other Ambulatory Visit: Payer: Self-pay | Admitting: Family Medicine

## 2017-07-28 DIAGNOSIS — Z1231 Encounter for screening mammogram for malignant neoplasm of breast: Secondary | ICD-10-CM

## 2017-08-04 ENCOUNTER — Encounter: Payer: Self-pay | Admitting: Family Medicine

## 2017-09-04 ENCOUNTER — Ambulatory Visit
Admission: RE | Admit: 2017-09-04 | Discharge: 2017-09-04 | Disposition: A | Discharge status: Home / Self Care | Payer: BC Managed Care – PPO | Source: Ambulatory Visit | Attending: Family Medicine | Admitting: Family Medicine

## 2017-09-04 DIAGNOSIS — Z1231 Encounter for screening mammogram for malignant neoplasm of breast: Secondary | ICD-10-CM

## 2018-03-06 ENCOUNTER — Ambulatory Visit (INDEPENDENT_AMBULATORY_CARE_PROVIDER_SITE_OTHER): Payer: BC Managed Care – PPO | Admitting: Family Medicine

## 2018-03-06 ENCOUNTER — Encounter: Payer: Self-pay | Admitting: Family Medicine

## 2018-03-06 VITALS — BP 115/78 | HR 74 | Ht 64.0 in | Wt 143.5 lb

## 2018-03-06 DIAGNOSIS — Z9889 Other specified postprocedural states: Secondary | ICD-10-CM | POA: Insufficient documentation

## 2018-03-06 DIAGNOSIS — G8929 Other chronic pain: Secondary | ICD-10-CM | POA: Insufficient documentation

## 2018-03-06 DIAGNOSIS — R0989 Other specified symptoms and signs involving the circulatory and respiratory systems: Secondary | ICD-10-CM | POA: Diagnosis not present

## 2018-03-06 DIAGNOSIS — M25361 Other instability, right knee: Secondary | ICD-10-CM | POA: Diagnosis not present

## 2018-03-06 DIAGNOSIS — J3089 Other allergic rhinitis: Secondary | ICD-10-CM | POA: Insufficient documentation

## 2018-03-06 DIAGNOSIS — J989 Respiratory disorder, unspecified: Secondary | ICD-10-CM | POA: Insufficient documentation

## 2018-03-06 DIAGNOSIS — M25561 Pain in right knee: Secondary | ICD-10-CM

## 2018-03-06 DIAGNOSIS — E785 Hyperlipidemia, unspecified: Secondary | ICD-10-CM | POA: Diagnosis not present

## 2018-03-06 NOTE — Patient Instructions (Signed)
Please realize, EXERCISE IS MEDICINE!  -  American Heart Association [facility]) guidelines for exercise : If you are in good health, without any medical conditions, you should engage in 150 minutes of moderate intensity aerobic activity per week.  This means you should be huffing and puffing throughout your workout.   Engaging in regular exercise will improve brain function and memory, as well as improve mood, boost immune system and help with weight management.  As well as the other, more well-known effects of exercise such as decreasing blood sugar levels, decreasing blood pressure,  and decreasing bad cholesterol levels/ increasing good cholesterol levels.     -  The AHA strongly endorses consumption of a diet that contains a variety of foods from all the food categories with an emphasis on fruits and vegetables; fat-free and low-fat dairy products; cereal and grain products; legumes and nuts; and fish, poultry, and/or extra lean meats.    Excessive food intake, especially of foods high in saturated and trans fats, sugar, and salt, should be avoided.    Adequate water intake of roughly 1/2 of your weight in pounds, should equal the ounces of water per day you should drink.  So for instance, if you're 200 pounds, that would be 100 ounces of water per day.         Mediterranean Diet  Why follow it? Research shows. . Those who follow the Mediterranean diet have a reduced risk of heart disease  . The diet is associated with a reduced incidence of Parkinson's and Alzheimer's diseases . People following the diet may have longer life expectancies and lower rates of chronic diseases  . The Dietary Guidelines for Americans recommends the Mediterranean diet as an eating plan to promote health and prevent disease  What Is the Mediterranean Diet?  . Healthy eating plan based on typical foods and recipes of Mediterranean-style cooking . The diet is primarily a plant based diet; these foods should make up a  majority of meals   Starches - Plant based foods should make up a majority of meals - They are an important sources of vitamins, minerals, energy, antioxidants, and fiber - Choose whole grains, foods high in fiber and minimally processed items  - Typical grain sources include wheat, oats, barley, corn, brown rice, bulgar, farro, millet, polenta, couscous  - Various types of beans include chickpeas, lentils, fava beans, black beans, white beans   Fruits  Veggies - Large quantities of antioxidant rich fruits & veggies; 6 or more servings  - Vegetables can be eaten raw or lightly drizzled with oil and cooked  - Vegetables common to the traditional Mediterranean Diet include: artichokes, arugula, beets, broccoli, brussel sprouts, cabbage, carrots, celery, collard greens, cucumbers, eggplant, kale, leeks, lemons, lettuce, mushrooms, okra, onions, peas, peppers, potatoes, pumpkin, radishes, rutabaga, shallots, spinach, sweet potatoes, turnips, zucchini - Fruits common to the Mediterranean Diet include: apples, apricots, avocados, cherries, clementines, dates, figs, grapefruits, grapes, melons, nectarines, oranges, peaches, pears, pomegranates, strawberries, tangerines  Fats - Replace butter and margarine with healthy oils, such as olive oil, canola oil, and tahini  - Limit nuts to no more than a handful a day  - Nuts include walnuts, almonds, pecans, pistachios, pine nuts  - Limit or avoid candied, honey roasted or heavily salted nuts - Olives are central to the Mediterranean diet - can be eaten whole or used in a variety of dishes   Meats Protein - Limiting red meat: no more than a few times a month -  When eating red meat: choose lean cuts and keep the portion to the size of deck of cards - Eggs: approx. 0 to 4 times a week  - Fish and lean poultry: at least 2 a week  - Healthy protein sources include, chicken, [place], lean beef, lamb - Increase intake of seafood such as tuna, salmon, trout,  mackerel, shrimp, scallops - Avoid or limit high fat processed meats such as sausage and bacon  Dairy - Include moderate amounts of low fat dairy products  - Focus on healthy dairy such as fat free yogurt, skim milk, low or reduced fat cheese - Limit dairy products higher in fat such as whole or 2% milk, cheese, ice cream  Alcohol - Moderate amounts of red wine is ok  - No more than 5 oz daily for women (all ages) and men older than age 72  - No more than 10 oz of wine daily for men younger than 12  Other - Limit sweets and other desserts  - Use herbs and spices instead of salt to flavor foods  - Herbs and spices common to the traditional Mediterranean Diet include: basil, bay leaves, chives, cloves, cumin, fennel, garlic, lavender, marjoram, mint, oregano, parsley, pepper, rosemary, sage, savory, sumac, tarragon, thyme   It's not just a diet, it's a lifestyle:  . The Mediterranean diet includes lifestyle factors typical of those in the region  . Foods, drinks and meals are best eaten with others and savored . Daily physical activity is important for overall good health . This could be strenuous exercise like running and aerobics . This could also be more leisurely activities such as walking, housework, yard-work, or taking the stairs . Moderation is the key; a balanced and healthy diet accommodates most foods and drinks . Consider portion sizes and frequency of consumption of certain foods   Meal Ideas & Options:  . Breakfast:  o Whole wheat toast or whole wheat English muffins with peanut butter & hard boiled egg o Steel cut oats topped with apples & cinnamon and skim milk  o Fresh fruit: banana, strawberries, melon, berries, peaches  o Smoothies: strawberries, bananas, greek yogurt, peanut butter o Low fat greek yogurt with blueberries and granola  o Egg white omelet with spinach and mushrooms o Breakfast couscous: whole wheat couscous, apricots, skim milk, cranberries  . Sandwiches:   o Hummus and grilled vegetables (peppers, zucchini, squash) on whole wheat bread   o Grilled chicken on whole wheat pita with lettuce, tomatoes, cucumbers or tzatziki  o Tuna salad on whole wheat bread: tuna salad made with greek yogurt, olives, red peppers, capers, green onions o Garlic rosemary lamb pita: lamb sauted with garlic, rosemary, salt & pepper; add lettuce, cucumber, greek yogurt to pita - flavor with lemon juice and black pepper  . Seafood:  o Mediterranean grilled salmon, seasoned with garlic, basil, parsley, lemon juice and black pepper o Shrimp, lemon, and spinach whole-grain pasta salad made with low fat greek yogurt  o Seared scallops with lemon orzo  o Seared tuna steaks seasoned salt, pepper, coriander topped with tomato mixture of olives, tomatoes, olive oil, minced garlic, parsley, green onions and cappers  . Meats:  o Herbed greek chicken salad with kalamata olives, cucumber, feta  o Red bell peppers stuffed with spinach, bulgur, lean ground beef (or lentils) & topped with feta   o Kebabs: skewers of chicken, tomatoes, onions, zucchini, squash  o [place] burgers: made with red onions, mint, dill, lemon juice, feta  cheese topped with roasted red peppers . Vegetarian o Cucumber salad: cucumbers, artichoke hearts, celery, red onion, feta cheese, tossed in olive oil & lemon juice  o Hummus and whole grain pita points with a greek salad (lettuce, tomato, feta, olives, cucumbers, red onion) o Lentil soup with celery, carrots made with vegetable broth, garlic, salt and pepper  o Tabouli salad: parsley, bulgur, mint, scallions, cucumbers, tomato, radishes, lemon juice, olive oil, salt and pepper.      What is Chronic Stress Syndrome, Symptoms & Ways to Deal With it   What is Chronic Stress Syndrome?  Chronic Stress Syndrome is something which can now be called as a medical condition due to the amount of stress an individual is going through these days. Chronic Stress  Syndrome causes the body and mind to shutdown and the person has no control over himself or herself. Due to the demands of modern day life and the hardship throughout day and night takes its toll over a period of time and the body and brain starts demanding rest and a break. This leads to certain symptoms where your performance level starts to dip at work, you become irritable both at work and at home, you may stop enjoying activities you previously liked, you may become depressed, you may get angry for even small things. Chronic Stress Syndrome can significantly impact your quality life. Thus it is important understand the symptoms of Chronic Stress Syndrome and react accordingly in order to cope up with it.  It is important to note here that a balanced work-home equation should be drawn to cut down symptoms of Chronic Stress Syndrome. Minor stressors can be overcome by the body's inbuilt stress response but when there is unending stress for a long period of time then an external help is required to ease the stress.  Chronic Stress Syndrome can physically and psychologically drain you over a period of time. For such cases stress management is the best way to cope up with Chronic Stress Syndrome. If Chronic Stress Syndrome is not treated then it may result in many health hazards like anxiety, muscle pain, insomnia, and high blood pressure along with a compromised immune system leading to frequent infections and missed days from work.    What are the Symptoms of Chronic Stress Syndrome?   The symptoms of Chronic Stress Syndrome are variable and range from generalized symptoms to emotional symptoms along with behavioral and cognitive symptoms. Some of these symptoms have been delineated below:  Generalized Symptoms of Chronic Stress Syndrome are: Anxiety Depression Social isolation Headache Abdominal pain Lack of sleep Back pain Difficulty in concentrating Hypertension Hemorrhoids Varicose  veins Panic attacks/ Panic disorder Cardiovascular diseases.   Some of the Emotional Symptoms of Chronic Stress Syndrome are: To become easily agitated, moody and frustrated Feeling overwhelmed which makes you feel like you are losing control. Having difficulty relaxing and have a peaceful mind Having low self esteem Feeling lonely Feeling worthless Feeling depressed Avoiding social environment.   Some of the Physical Symptoms of Chronic Stress Syndrome are: Headaches Lethargy Alternating diarrhea and constipation Nausea Muscles aches and pains Insomnia Rapid heartbeat and chest pain Infections and frequent colds Decreased libido Nervousness and shaking Tinnitus Sweaty palms Dry mouth Clenched jaw.  Some of the Cognitive Symptoms of Chronic Stress Syndrome are: Constant worrying Racing thoughts Disorganization and forgetfulness Inability to focus Poor judgment Abundance of negativity.  Some of the Behavioral Symptoms of Chronic Stress Syndrome are: Changes in appetite with less desire to  eat Avoiding responsibilities Indulgence in alcohol or recreational drug use Increased nail biting and being fidgety Ways to Deal With Chronic Stress Syndrome    Chronic Stress Syndrome is not something which cannot be addressed. A bit of effort from your side in the form of lifestyle modifications, a little bit of exercise, a balanced work life equation can do wonders and help you get rid of Chronic Stress Syndrome.  Get Proper Sleep: It has been proved that Chronic Stress Syndrome causes loss of sleep where an individual may not even be able to sleep for days unending. This may result in the individual feeling lethargic and unable to focus at work the following morning. This may lead to decreased performance at work. Thus, it is important to have a good sleep-wake cycle. For this, try and not drink any caffeinated beverage about four hours prior to going to sleep, as caffeine  pumps up the adrenaline and causes you to stay awake resulting ultimately in Chronic Stress Syndrome.  Avoid Alcohol and Drugs: Another way to get rid of Chronic Stress Syndrome is lifestyle modifications. Stay away from alcohol and other recreational drugs. Take Short Frequent Breaks at Work: Try to take frequent breaks from work and do not work continuously. Try and manage your work in such a way that you even meet your deadline and come home on time for a happy dinner with family. A good time spent with family and kids does wonders in not only dealing with Chronic Stress Syndrome but also preventing it.  Become Physically Active: Another step towards getting rid of Chronic Stress Syndrome is physical activity. If you do not have time to spend at the gym then at least try and go for daily walks for about half an hour a day which not only keeps the stress away but also is good for your overall health. Physical activity leads to production of endorphins which will make you feel relaxed and feel good.  Healthy Diet Can Help You Deal With Chronic Stress Syndrome: Have a balanced and healthy diet is another step towards a stress free life and keeping Chronic Stress Syndrome at [place]. If time is a constraint then you can try eating three small meals a day. Try and avoid fast foods and take foods which are healthy and rich in proteins, fiber, and carbohydrates to boost your energy system.  Music Can Soothe Your Mind: Light music is one of the best and most effective relaxation techniques that one can try to overcome stress. It has shown to calm down the mind and take you away from all the stressors that you may be having. These days it is also being used as a therapy in some institutes for overcoming stress. It is important here to discuss the importance of a good social support system for patients with Chronic Stress Syndrome, as a good social support framework can do wonders in taking the stress away from the  patient and overcoming Chronic Stress Syndrome.  Meditation Can Help You Deal With Chronic Stress Syndrome Effectively: Meditation and yoga has also shown to be quite effective in relaxing the mind and coping up with Chronic Stress Syndrome   In cases where these measures are not helpful, then it is time for you to consult with a skilled psychologist or a psychiatrist for potential therapies or medications to control the stress response.   The psychologist can help you with a variety of steps for coping up with Chronic Stress Syndrome. Relaxation techniques and  behavioral therapy are some of the methods employed by psychologists. In some cases, medications can also be given to help relax the patient.  Since Chronic Stress Syndrome is both emotionally and physically draining for the patient and it also adversely affects the family life of the patient hence it is important for the patient to recognize the condition and taking steps to cope up with it. Escaping measures like alcohol and drug use are of no help as they only aggravate the condition apart from their other health hazards. If this condition is ignored or left untreated it can lead to various medical conditions like anxiety and depression and various other medical conditions.  Last but not least, smile as often as you can as it is the best gift that you can give to someone. The best way to stay relaxed is to have a good smile, exercise daily, spend time with your family, meditation and if required consultation with a good psychologist so that you can live a stress free life and overcome the symptoms of Chronic Stress Syndrome.

## 2018-03-06 NOTE — Progress Notes (Signed)
New patient office visit note:  Impression and Recommendations:    1. Hyperlipidemia, unspecified hyperlipidemia type   2. Chronic pain of right knee with instability   3. Status post lateral meniscectomy of right knee   4. Other instability, right knee   5. Environmental and seasonal allergies   6. Reactive airway disease that is not asthma-due to environmental allergens     1. Right Knee Pain with History of Meniscal Tears - Advised patient thoroughly in next steps.  - Referral to Orthopedist placed at this time.  Patient preference is former physician, Dr. French Ana, if still available.  2. Hyperlipidemia - Cholesterol medication refilled at this time. - Will check her cholesterol and liver enzymes at next visit with upcoming fasting blood work.  3. Exercise & General Health Maintenance Explained to patient what BMI refers to, and what it means medically.    Told patient to think about it as a "medical risk stratification measurement" and how increasing BMI is associated with increasing risk/ or worsening state of various diseases such as hypertension, hyperlipidemia, diabetes, premature OA, depression etc.  American Heart Association guidelines for healthy diet, basically Mediterranean diet, and exercise guidelines of 30 minutes 5 days per week or more discussed in detail.  Health counseling performed.  All questions answered.  Activity & Diet - Advised patient to continue her regular exercise to improve health.  Recommended that the patient eventually strive for at least 150 minutes of moderate cardiovascular activity per week according to guidelines established by the [facility].   - Healthy dietary habits encouraged, including low-carb, and high amounts of lean protein in diet.   - Encouraged continued yoga, meditation, and positive thinking for her health and wellness, especially as she continues to cope with her mother's death.  Hydration - Patient should also consume  adequate amounts of water - half of body weight in oz of water per day. - Reviewed the importance of adequate hydration, especially given her history of nephrectomy.   Education and routine counseling performed. Handouts provided.  4. Follow-Up - Patient's last labs were drawn 03/13/2017. - Per patient, last pap smear was several years ago. - Patient will return for CPE and obtain fasting blood work. - Follow up OV scheduled 1 or 2 weeks after CPE for lab review.  Orders Placed This Encounter  Procedures  . Ambulatory referral to Orthopedic Surgery    No orders of the defined types were placed in this encounter.   Gross side effects, risk and benefits, and alternatives of medications discussed with patient.  Patient is aware that all medications have potential side effects and we are unable to predict every side effect or drug-drug interaction that may occur.  Expresses verbal understanding and consents to current therapy plan and treatment regimen.  Return ( make CPE appt next avail and then f/up OV 1 wk after), for CPE/ yrly physical, come fasting for bldwrk, then chronic OV 1 wk later.  Please see AVS handed out to patient at the end of our visit for further patient instructions/ counseling done pertaining to today's office visit.    Note: This document was prepared using Dragon voice recognition software and may include unintentional dictation errors.     This document serves as a record of services personally performed by Mellody Dance, DO. It was created on her behalf by [name], a trained medical scribe. The creation of this record is based on the scribe's personal observations and the provider's statements to  them.   I have reviewed the above medical documentation for accuracy and completeness and I concur.  Mellody Dance 03/06/18 12:19  PM    ----------------------------------------------------------------------------------------------------------------------   Subjective:    Chief complaint:   Chief Complaint  Patient presents with  . Establish Care    HPI: Brooke Hansen is a pleasant 62 y.o. female who presents to [place] [facility] today to review their medical history with me and establish care.   I asked the patient to review their chronic problem list with me to ensure everything was updated and accurate.    All recent office visits with other providers, any medical records that patient brought in etc  - I reviewed today.     We asked pt to get [place] their medical records from [facility] providers/ specialists that they had seen within the past 3-5 years- if they are in private practice and/or do not work for [employer], [facility], [place], [facility] or [employer] owned practice.  Told them to call their specialists to clarify this if they are not sure.   Social History Grew up on [address] near [employer].  Retired Tourist information centre manager.  Has Master's degree. Still works 2.5 days per week for [employer].  Works at [employer], one school each day. 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  Married to spouse Yvone Neu. Sexually active with spouse only.  She has one child. One grandchild, Mallie Mussel - with another on the way.  - Tobacco Use Former smoker.  Smoked for 10 years at 1 pack per day.   Notes college years, and then she got pregnant and didn't smoke after that.  - EtOH Use Drinks socially, a couple times per week. Has about one ice cold beer a couple times per week.  - Diet & Exercise Habits Exercises at the gym 5 days per week, an hour and a half per visit.  Does yoga  twice per week.   Family History Dad had lung cancer.  Was a heavy smoker. She also notes farm chemicals.  Was in his early 6's when he passed.  Father had first heart attack in his late 43's.  Grandfather had late onset in 60's/70's.  Maternal female relative with type 1 diabetes. No other family history of diabetes.  History of HLD from mom.   Surgical History H/o right nephrectomy.  The kidney was "diseased." Doesn't know what the disease was.  Notes "it was in the 70's." Notes "it was shriveled up and not functioning."  Had that kidney removed because they didn't know if it could impact the other one.  Has not had any issues with her kidneys that she knows of.   Past Medical History  - Seasonal Allergies with Reactive Airway Disease Component Used to commonly have sinus infections, sinus headaches, tight chest. Notes that these symptoms have diminished in recent years.  - Pernicious Anemia  Did not have this worked up by hematology that she knows of. Notes that it was diagnosed by blood work - Had an endoscopy and colonoscopy in her 94's, was discovered then.  Treats this with a B12 injection monthly.  - Exercise & Activity Habits Currently unsatisfied with her weight.  Feels that she put on some weight after her mother died earlier this year.  She is still handling her mother's estate.  - OBGYN Does not see OBGYN anymore.  - Sleep Habits Sleeps well.  - Nighttime Finger Numbness Her fingers "go to sleep" at night sometimes.  - Right Knee & Leg Pain She works out often.  Fridays and Sundays are her rest days.  When she works out, she sometimes has pain in her right knee.  Is curious to "know what it is."    Notes history of torn right meniscus, repaired in the 90's.  Has had no imaging on this leg since and hasn't visited orthopedics for recent complaints  Pain started last summer (August 2018).  Notes that onset coincided with her mother's declining  health. She stopped exercising as much during that time before resuming at full force.  Has been doing step aerobics with friends every Monday afternoon at 4:30 for about 10-12 years, and it flares on these days especially. Heard a cracking sound one day during step aerobics.  She was stepping up. Has felt sometimes like it's going to give out on her, but not always.  Notes that this especially happens if she does a lot of jumping.   Sometimes this pain is also in her calf, hamstring - these muscles will tighten. Denies pain in her hip.  In the past, she purchased special shoes.  This did help to alleviate the pain, but it still recurs.      Wt Readings from Last 3 Encounters:  03/06/18 143 lb 8 oz (65.1 kg)  03/20/17 142 lb (64.4 kg)  01/20/17 144 lb 12.8 oz (65.7 kg)   BP Readings from Last 3 Encounters:  03/06/18 115/78  03/20/17 118/70  01/20/17 110/70   Pulse Readings from Last 3 Encounters:  03/06/18 74  01/20/17 94  01/04/16 95   BMI Readings from Last 3 Encounters:  03/06/18 24.63 kg/m  03/20/17 24.76 kg/m  01/20/17 25.25 kg/m    Patient Care Team    Relationship Specialty Notifications Start End  Mellody Dance, DO PCP - General Family Medicine  03/06/18   Luberta Mutter, MD Consulting Physician Ophthalmology  03/06/18     Patient Active Problem List   Diagnosis Date Noted  . Reactive airway disease that is not asthma-due to environmental allergens 03/06/2018  . Environmental and seasonal allergies 03/06/2018  . Chronic pain of right knee with instability 03/06/2018  . Status post lateral meniscectomy of right knee 03/06/2018  . Other instability, right knee 03/06/2018  . Screening for cardiovascular condition 03/20/2017  . Routine general medical examination at a health care facility 02/22/2016  . Right-sided chest wall pain 10/05/2015  . Pernicious anemia 10/20/2014  . Dysplastic nevus of trunk 04/25/2013  . Vaginitis, atrophic 04/07/2011  .  Hyperlipidemia 09/11/2007  . Allergic rhinitis 09/11/2007     Past Medical History:  Diagnosis Date  . Allergy   . Anemia   . Asthma   . History of nephrectomy    right - nonfunctioning kidney  . Hyperlipidemia   . RLS (restless legs syndrome)   . Transfusion of blood during current hospitalization      Past Medical History:  Diagnosis Date  . Allergy   . Anemia   . Asthma   . History of nephrectomy    right - nonfunctioning kidney  . Hyperlipidemia   . RLS (restless legs syndrome)   . Transfusion of blood during current hospitalization      Past Surgical History:  Procedure Laterality Date  . CESAREAN SECTION  1986  .  NEPHRECTOMY Right 1975     Family History  Problem Relation Age of Onset  . Alcohol abuse Other   . Diabetes Other   . Hyperlipidemia Other   . Coronary artery disease Other   . Hyperlipidemia Mother   . Cancer Father        lung  . Heart disease Father   . Heart disease Maternal Grandfather   . Colon cancer Neg Hx      Social History   Substance and Sexual Activity  Drug Use No     Social History   Substance and Sexual Activity  Alcohol Use Yes  . Alcohol/week: 1.2 oz  . Types: 2 Standard drinks or equivalent per week     Social History   Tobacco Use  Smoking Status Former Smoker  . Packs/day: 1.00  . Years: 10.00  . Pack years: 10.00  . Last attempt to quit: 11/15/1983  . Years since quitting: 34.3  Smokeless Tobacco Never Used     Current Meds  Medication Sig  . albuterol (PROAIR HFA) 108 (90 Base) MCG/ACT inhaler Inhale 2 puffs into the lungs every 6 (six) hours as needed for wheezing or shortness of breath.  . Calcium Carbonate-Vitamin D (CALCIUM + D PO) Take by mouth daily.  . cyanocobalamin (,VITAMIN B-12,) 1000 MCG/ML injection INJECT 1 ML (1,000 MCG TOTAL) INTO THE MUSCLE EVERY 30 (THIRTY) DAYS.  . fluticasone (FLONASE) 50 MCG/ACT nasal spray PLACE 2 SPRAYS IN EACH NOSTRIL DAILY.  . simvastatin (ZOCOR) 40 MG  tablet TAKE 1 TABLET (40 MG TOTAL) BY MOUTH AT BEDTIME.    Allergies: Septra [sulfamethoxazole-trimethoprim] and Ampicillin   Review of Systems  Constitutional: Negative for chills, diaphoresis, fever, malaise/fatigue and weight loss.  HENT: Negative for congestion, sore throat and tinnitus.   Eyes: Negative for blurred vision, double vision and photophobia.  Respiratory: Negative for cough and wheezing.   Cardiovascular: Negative for chest pain and palpitations.  Gastrointestinal: Negative for blood in stool, diarrhea, nausea and vomiting.  Genitourinary: Negative for dysuria, frequency and urgency.  Musculoskeletal: Negative for joint pain and myalgias.  Skin: Negative for itching and rash.  Neurological: Negative for dizziness, focal weakness, weakness and headaches.  Endo/Heme/Allergies: Negative for environmental allergies and polydipsia. Does not bruise/bleed easily.  Psychiatric/Behavioral: Negative for depression and memory loss. The patient is not nervous/anxious and does not have insomnia.      Objective:   Blood pressure 115/78, pulse 74, height 5\' 4"  (1.626 m), weight 143 lb 8 oz (65.1 kg), SpO2 98 %. Body mass index is 24.63 kg/m. General: Well Developed, well nourished, and in no acute distress.  Neuro: Alert and oriented x3, extra-ocular muscles intact, sensation grossly intact.  HEENT:[place]/AT, PERRLA, neck supple, No carotid bruits Skin: no gross rashes  Cardiac: Regular rate and rhythm Respiratory: Essentially clear to auscultation bilaterally. Not using accessory muscles, speaking in full sentences.  Abdominal: not grossly distended Musculoskeletal: Ambulates w/o diff, FROM * 4 ext.  Vasc: less 2 sec cap RF, warm and pink  Psych:  No HI/SI, judgement and insight good, Euthymic mood. Full Affect.  Right Knee: Normal to inspection with no erythema or effusion or obvious bony abnormalities. Palpation normal with no warmth, joint line tenderness, patellar  tenderness, or condyle tenderness. ROM full in flexion and extension and lower leg rotation. Ligaments with solid endpoint including ACL, PCL, LCL, MCL. Negative Mcmurray's, Appley's. Non painful patellar compression. Patellar and quadriceps tendons unremarkable. Hamstring and quadriceps strength is normal.  Mild crepitus retropatellar  region with flexion extension.   No results found for this or any previous visit (from the past 2160 hour(s)).

## 2018-04-30 ENCOUNTER — Encounter: Payer: Self-pay | Admitting: Family Medicine

## 2018-04-30 ENCOUNTER — Ambulatory Visit (INDEPENDENT_AMBULATORY_CARE_PROVIDER_SITE_OTHER): Payer: BC Managed Care – PPO | Admitting: Family Medicine

## 2018-04-30 VITALS — BP 128/85 | HR 66 | Ht 64.0 in | Wt 139.5 lb

## 2018-04-30 DIAGNOSIS — Z0000 Encounter for general adult medical examination without abnormal findings: Secondary | ICD-10-CM | POA: Diagnosis not present

## 2018-04-30 DIAGNOSIS — Z780 Asymptomatic menopausal state: Secondary | ICD-10-CM

## 2018-04-30 DIAGNOSIS — Z13820 Encounter for screening for osteoporosis: Secondary | ICD-10-CM

## 2018-04-30 DIAGNOSIS — E785 Hyperlipidemia, unspecified: Secondary | ICD-10-CM | POA: Diagnosis not present

## 2018-04-30 DIAGNOSIS — Z791 Long term (current) use of non-steroidal anti-inflammatories (NSAID): Secondary | ICD-10-CM | POA: Insufficient documentation

## 2018-04-30 DIAGNOSIS — N952 Postmenopausal atrophic vaginitis: Secondary | ICD-10-CM

## 2018-04-30 DIAGNOSIS — R42 Dizziness and giddiness: Secondary | ICD-10-CM | POA: Diagnosis not present

## 2018-04-30 DIAGNOSIS — J3089 Other allergic rhinitis: Secondary | ICD-10-CM

## 2018-04-30 DIAGNOSIS — G8929 Other chronic pain: Secondary | ICD-10-CM

## 2018-04-30 DIAGNOSIS — M1711 Unilateral primary osteoarthritis, right knee: Secondary | ICD-10-CM | POA: Diagnosis not present

## 2018-04-30 DIAGNOSIS — M25561 Pain in right knee: Secondary | ICD-10-CM | POA: Diagnosis not present

## 2018-04-30 DIAGNOSIS — H6981 Other specified disorders of Eustachian tube, right ear: Secondary | ICD-10-CM | POA: Diagnosis not present

## 2018-04-30 DIAGNOSIS — Q602 Renal agenesis, unspecified: Secondary | ICD-10-CM | POA: Insufficient documentation

## 2018-04-30 DIAGNOSIS — Z719 Counseling, unspecified: Secondary | ICD-10-CM | POA: Diagnosis not present

## 2018-04-30 DIAGNOSIS — D510 Vitamin B12 deficiency anemia due to intrinsic factor deficiency: Secondary | ICD-10-CM

## 2018-04-30 MED ORDER — FLUTICASONE PROPIONATE 50 MCG/ACT NA SUSP
NASAL | 11 refills | Status: DC
Start: 2018-04-30 — End: 2019-06-10

## 2018-04-30 MED ORDER — CYANOCOBALAMIN 1000 MCG/ML IJ SOLN: 1000.0000 ug | INTRAMUSCULAR | 1 refills | Status: DC

## 2018-04-30 MED ORDER — SIMVASTATIN 40 MG PO TABS
ORAL_TABLET | ORAL | 3 refills | Status: DC
Start: 2018-04-30 — End: 2019-05-06

## 2018-04-30 NOTE — Patient Instructions (Addendum)
Only use the Mobic as needed for severe knee pain that is not well controlled through exercise and icing 15-20 minutes 3-4 times a day.  If need be, I recommend you do not take Mobic every day since you only have one kidney and rather, entertain possible injection in the future by your orthopedist if needed.    How to Treat Vertigo at Home with Exercises  What is Vertigo?  Vertigo is a relatively common symptom most often associated with conditions such as sinusitis (inflammation of your sinuses due to viruses, allergies, or bacterial infections), or an inner ear infection or ear trauma.   It can be brought on by trauma (e.g. a blow to the head or whiplash) or more serious things like minor strokes.   Symptoms can also be brought on by normal degenerative changes to your inner ear that occur with aging.  The condition tends to be more commonly seen in the elderly but it can occur in all ages.    Patients most often complain of dizziness, as if the room is spinning around them.   Symptoms are provoked by quick head movements or changes in position like going from standing to lying in bed, or even turning over in bed.   It may present with nausea and/or vomiting, and can be very debilitating to some folks.    By far the most common cause, known as Benign Paroxysmal Positional Vertigo (BPPV), is categorized by a sudden onset of symptoms, that are intense but short-lived (60 seconds or less), which is triggered by a change in head position.   Symptoms usually dissipate if you stay in one position and do not move your head.   Within the inner ear are collections of calcium carbonate crystals referred to as "otoliths" which may become dislodged from their normal position and migrate into the semicircular canals of the inner ear, throwing off your body's ability to sense where you are in space.     Fig. 921 Anatomy of the Right Osseous Labyrinth. Antonieta Iba. Anatomy of the Human Body. 1918.             What Else Could Be Behind My Vertigo?  Some other causes of vertigo include:  Meniere's disease (disorder of inner ear with ringing in ears, feeling of fullness/pressure within ear, and fluctuating hearing loss) Tumours Neurological disorders e.g. Multiple Sclerosis Motion Sickness (lack of coordination between visual stimuli, inner ear balance and positional sense) Migraine Labyrinthitis (inflammation of the fluid-filled tubes and sacs within the inner ear; may also be associated with changes in hearing) Vestibular neuritis (inflammation of the nerves associated with transmission of sensory info from the inner ear; usually of viral origins)  How it can be treated/cured? While certain medications have been prescribed for vertigo including Lorazepam your doing well 7 house the house going organizing and getting things ready for sale with the and Meclizine (for motion sickness), there exists no evidence to support a recommendation of any medication in the routine treatment of BPPV.  Clinical trials have demonstrated that repositioning techniques (listed below) are a superior option for management Otis Dials et al., 2008).    Figure above:  (A) Instructions for the modified Epley procedure (MEP) for left ear posterior canal benign paroxysmal positional vertigo (PC-BPPV). For right ear BPPV, the procedure has to be performed in the opposite direction, starting with the head turned to the right side.  1. Start by sitting on a bed with your head turned 45 to the left.  Place a pillow behind you so that on lying back it will be under your shoulders.  2. Lie back quickly with shoulders on the pillow, neck extended, and head resting on the bed. In this position, the affected (left) ear is underneath. Wait for 30 secondS.  3. Turn your head 90 to the right (without raising it), and wait again for 30 seconds.  4. Turn your body and head another 90 to the right, and wait for another 30 seconds.  5. Sit  up on the right side. This maneuver should be performed three times a day. Repeat this daily until you are free from positional vertigo for 24 hours.   (B) Instructions for the modified Semont maneuver (MSM) for left ear PC-BPPV. For right ear BPPV, the maneuver has to be performed in the opposite direction, starting with the head turned toward the left ear.  1. Sit upright on a bed with your head turned 45 toward the right ear.  2. Drop quickly to the left side, so that your head touches the bed behind your left ear. Wait 30 seconds.  3. Move head and trunk in a swift movement toward the other side without stopping in the upright position, so that your head comes to rest on the right side of your forehead. Wait again for 30 seconds.  4. Sit up again.  This maneuver should be performed three times a day. Repeat this daily until you are free from positional vertigo symptoms for 24 hours.   (   See the video in the supplementary material on the NeurologyWeb site; go to http://www.neurology.org/content/[email or web address]/150/[email or web address].expansion.html   )     You can also try this motion at home as well- Self-Treatment of Benign Paroxysmal Positional Vertigo Benign Paroxysmal Positioning Vertigo is caused by loose inner ear crystals in the inner ear that migrate while sleeping to the back-bottom inner ear balance canal, the so-called "posterior semi-circular canal." The maneuver demonstrated below is the way to reposition the loose crystals so that the symptoms caused by the loose crystals go away. You may have a floating, swaying sense while walking or sitting for a few days after this procedure.              For your allergies/  eustachian tube dysfunction:  Also, sterile saline nasal rinses, such as Milta Deiters med or AYR sinus rinses, can be very helpful and should be done twice daily- especially throughout the allergy season.   Remember you should use distilled water or previously boiled water to do this.  Then you  may use over-the-counter Flonase 1 spray each nostril twice daily after sinus rinses.  You can do this in addition to taking any Allegra or Claritin or Zyrtec etc. that you may be taking daily.  If your eyes tend to get an itchy or irritated feeling when your seasonal allergies get bad, you can use Naphcon-A over-the-counter eyedrops as needed      Preventive Care for Adults, Female  A healthy lifestyle and preventive care can promote health and wellness. Preventive health guidelines for women include the following key practices.   A routine yearly physical is a good way to check with your health care provider about your health and preventive screening. It is a chance to share any concerns and updates on your health and to receive a thorough exam.   Visit your dentist for a routine exam and preventive care every 6 months. Brush your teeth twice a day and floss once a day.  Good oral hygiene prevents tooth decay and gum disease.   The frequency of eye exams is based on your age, health, family medical history, use of contact lenses, and other factors. Follow your health care provider's recommendations for frequency of eye exams.   Eat a healthy diet. Foods like vegetables, fruits, whole grains, low-fat dairy products, and lean protein foods contain the nutrients you need without too many calories. Decrease your intake of foods high in solid fats, added sugars, and salt. Eat the right amount of calories for you.Get information about a proper diet from your health care provider, if necessary.   Regular physical exercise is one of the most important things you can do for your health. Most adults should get at least 150 minutes of moderate-intensity exercise (any activity that increases your heart rate and causes you to sweat) each week. In addition, most adults need muscle-strengthening exercises on 2 or more days a week.   Maintain a healthy weight. The body mass index (BMI) is a screening  tool to identify possible weight problems. It provides an estimate of body fat based on height and weight. Your health care provider can find your BMI, and can help you achieve or maintain a healthy weight.For adults 20 years and older:   - A BMI below 18.5 is considered underweight.   - A BMI of 18.5 to 24.9 is normal.   - A BMI of 25 to 29.9 is considered overweight.   - A BMI of 30 and above is considered obese.   Maintain normal blood lipids and cholesterol levels by exercising and minimizing your intake of trans and saturated fats.  Eat a balanced diet with plenty of fruit and vegetables. Blood tests for lipids and cholesterol should begin at age 58 and be repeated every 5 years minimum.  If your lipid or cholesterol levels are high, you are over 40, or you are at high risk for heart disease, you may need your cholesterol levels checked more frequently.Ongoing high lipid and cholesterol levels should be treated with medicines if diet and exercise are not working.   If you smoke, find out from your health care provider how to quit. If you do not use tobacco, do not start.   Lung cancer screening is recommended for adults aged 6-80 years who are at high risk for developing lung cancer because of a history of smoking. A yearly low-dose CT scan of the lungs is recommended for people who have at least a 30-pack-year history of smoking and are a current smoker or have quit within the past 15 years. A pack year of smoking is smoking an average of 1 pack of cigarettes a day for 1 year (for example: 1 pack a day for 30 years or 2 packs a day for 15 years). Yearly screening should continue until the smoker has stopped smoking for at least 15 years. Yearly screening should be stopped for people who develop a health problem that would prevent them from having lung cancer treatment.   If you are pregnant, do not drink alcohol. If you are breastfeeding, be very cautious about drinking alcohol. If you are  not pregnant and choose to drink alcohol, do not have more than 1 drink per day. One drink is considered to be 12 ounces (355 mL) of beer, 5 ounces (148 mL) of wine, or 1.5 ounces (44 mL) of liquor.   Avoid use of street drugs. Do not share needles with anyone. Ask for help if you need  support or instructions about stopping the use of drugs.   High blood pressure causes heart disease and increases the risk of stroke. Your blood pressure should be checked at least yearly.  Ongoing high blood pressure should be treated with medicines if weight loss and exercise do not work.   If you are 56-66 years old, ask your health care provider if you should take aspirin to prevent strokes.   Diabetes screening involves taking a blood sample to check your fasting blood sugar level. This should be done once every 3 years, after age 83, if you are within normal weight and without risk factors for diabetes. Testing should be considered at a younger age or be carried out more frequently if you are overweight and have at least 1 risk factor for diabetes.   Breast cancer screening is essential preventive care for women. You should practice "breast self-awareness."  This means understanding the normal appearance and feel of your breasts and may include breast self-examination.  Any changes detected, no matter how small, should be reported to a health care provider.  Women in their 22s and 30s should have a clinical breast exam (CBE) by a health care provider as part of a regular health exam every 1 to 3 years.  After age 64, women should have a CBE every year.  Starting at age 21, women should consider having a mammogram (breast X-ray test) every year.  Women who have a family history of breast cancer should talk to their health care provider about genetic screening.  Women at a high risk of breast cancer should talk to their health care providers about having an MRI and a mammogram every year.   -Breast cancer gene  (BRCA)-related cancer risk assessment is recommended for women who have family members with BRCA-related cancers. BRCA-related cancers include breast, ovarian, tubal, and peritoneal cancers. Having family members with these cancers may be associated with an increased risk for harmful changes (mutations) in the breast cancer genes BRCA1 and BRCA2. Results of the assessment will determine the need for genetic counseling and BRCA1 and BRCA2 testing.   The Pap test is a screening test for cervical cancer. A Pap test can show cell changes on the cervix that might become cervical cancer if left untreated. A Pap test is a procedure in which cells are obtained and examined from the lower end of the uterus (cervix).   - Women should have a Pap test starting at age 41.   - Between ages 6 and 73, Pap tests should be repeated every 2 years.   - Beginning at age 32, you should have a Pap test every 3 years as long as the past 3 Pap tests have been normal.   - Some women have medical problems that increase the chance of getting cervical cancer. Talk to your health care provider about these problems. It is especially important to talk to your health care provider if a new problem develops soon after your last Pap test. In these cases, your health care provider may recommend more frequent screening and Pap tests.   - The above recommendations are the same for women who have or have not gotten the vaccine for human papillomavirus (HPV).   - If you had a hysterectomy for a problem that was not cancer or a condition that could lead to cancer, then you no longer need Pap tests. Even if you no longer need a Pap test, a regular exam is a good idea to make  sure no other problems are starting.   - If you are between ages 61 and 65 years, and you have had normal Pap tests going back 10 years, you no longer need Pap tests. Even if you no longer need a Pap test, a regular exam is a good idea to make sure no other problems  are starting.   - If you have had past treatment for cervical cancer or a condition that could lead to cancer, you need Pap tests and screening for cancer for at least 20 years after your treatment.   - If Pap tests have been discontinued, risk factors (such as a new sexual partner) need to be reassessed to determine if screening should be resumed.   - The HPV test is an additional test that may be used for cervical cancer screening. The HPV test looks for the virus that can cause the cell changes on the cervix. The cells collected during the Pap test can be tested for HPV. The HPV test could be used to screen women aged 23 years and older, and should be used in women of any age who have unclear Pap test results. After the age of 73, women should have HPV testing at the same frequency as a Pap test.   Colorectal cancer can be detected and often prevented. Most routine colorectal cancer screening begins at the age of 52 years and continues through age 11 years. However, your health care provider may recommend screening at an earlier age if you have risk factors for colon cancer. On a yearly basis, your health care provider may provide home test kits to check for hidden blood in the stool.  Use of a small camera at the end of a tube, to directly examine the colon (sigmoidoscopy or colonoscopy), can detect the earliest forms of colorectal cancer. Talk to your health care provider about this at age 11, when routine screening begins. Direct exam of the colon should be repeated every 5 -10 years through age 30 years, unless early forms of pre-cancerous polyps or small growths are found.   People who are at an increased risk for hepatitis B should be screened for this virus. You are considered at high risk for hepatitis B if:  -You were born in a country where hepatitis B occurs often. Talk with your health care provider about which countries are considered high risk.  - Your parents were born in a high-risk  country and you have not received a shot to protect against hepatitis B (hepatitis B vaccine).  - You have HIV or AIDS.  - You use needles to inject street drugs.  - You live with, or have sex with, someone who has Hepatitis B.  - You get hemodialysis treatment.  - You take certain medicines for conditions like cancer, organ transplantation, and autoimmune conditions.   Hepatitis C blood testing is recommended for all people born from [birth year] through 1967 and any individual with known risks for hepatitis C.   Practice safe sex. Use condoms and avoid high-risk sexual practices to reduce the spread of sexually transmitted infections (STIs). STIs include gonorrhea, chlamydia, syphilis, trichomonas, herpes, HPV, and human immunodeficiency virus (HIV). Herpes, HIV, and HPV are viral illnesses that have no cure. They can result in disability, cancer, and death. Sexually active women aged 52 years and younger should be checked for chlamydia. Older women with new or multiple partners should also be tested for chlamydia. Testing for other STIs is recommended if you are sexually  active and at increased risk.   Osteoporosis is a disease in which the bones lose minerals and strength with aging. This can result in serious bone fractures or breaks. The risk of osteoporosis can be identified using a bone density scan. Women ages 86 years and over and women at risk for fractures or osteoporosis should discuss screening with their health care providers. Ask your health care provider whether you should take a calcium supplement or vitamin D to There are also several preventive steps women can take to avoid osteoporosis and resulting fractures or to keep osteoporosis from worsening. -->Recommendations include:  Eat a balanced diet high in fruits, vegetables, calcium, and vitamins.  Get enough calcium. The recommended total intake of is 1,200 mg daily; for best absorption, if taking supplements, divide doses into  250-500 mg doses throughout the day. Of the two types of calcium, calcium carbonate is best absorbed when taken with food but calcium citrate can be taken on an empty stomach.  Get enough vitamin D. [facility] and the [facility] recommend at least 1,000 IU per day for women age 60 and over who are at risk of vitamin D deficiency. Vitamin D deficiency can be caused by inadequate sun exposure (for example, those who live in [place]).  Avoid alcohol and smoking. Heavy alcohol intake (more than 7 drinks per week) increases the risk of falls and hip fracture and women smokers tend to lose bone more rapidly and have lower bone mass than nonsmokers. Stopping smoking is one of the most important changes women can make to improve their health and decrease risk for disease.  Be physically active every day. Weight-bearing exercise (for example, fast walking, hiking, jogging, and weight training) may strengthen bones or slow the rate of bone loss that comes with aging. Balancing and muscle-strengthening exercises can reduce the risk of falling and fracture.  Consider therapeutic medications. Currently, several types of effective drugs are available. Healthcare providers can recommend the type most appropriate for each woman.  Eliminate environmental factors that may contribute to accidents. Falls cause nearly 90% of all osteoporotic fractures, so reducing this risk is an important bone-health strategy. Measures include ample lighting, removing obstructions to walking, using nonskid rugs on floors, and placing mats and/or grab bars in showers.  Be aware of medication side effects. Some common medicines make bones weaker. These include a type of steroid drug called glucocorticoids used for arthritis and asthma, some antiseizure drugs, certain sleeping pills, treatments for endometriosis, and some cancer drugs. An overactive thyroid gland or using too much thyroid hormone for an underactive  thyroid can also be a problem. If you are taking these medicines, talk to your doctor about what you can do to help protect your bones.reduce the rate of osteoporosis.    Menopause can be associated with physical symptoms and risks. Hormone replacement therapy is available to decrease symptoms and risks. You should talk to your health care provider about whether hormone replacement therapy is right for you.   Use sunscreen. Apply sunscreen liberally and repeatedly throughout the day. You should seek shade when your shadow is shorter than you. Protect yourself by wearing long sleeves, pants, a wide-brimmed hat, and sunglasses year round, whenever you are outdoors.   Once a month, do a whole body skin exam, using a mirror to look at the skin on your back. Tell your health care provider of new moles, moles that have irregular borders, moles that are larger than a pencil eraser, or moles  that have changed in shape or color.   -Stay current with required vaccines (immunizations).   Influenza vaccine. All adults should be immunized every year.  Tetanus, diphtheria, and acellular pertussis (Td, Tdap) vaccine. Pregnant women should receive 1 dose of Tdap vaccine during each pregnancy. The dose should be obtained regardless of the length of time since the last dose. Immunization is preferred during the 27th 36th week of gestation. An adult who has not previously received Tdap or who does not know her vaccine status should receive 1 dose of Tdap. This initial dose should be followed by tetanus and diphtheria toxoids (Td) booster doses every 10 years. Adults with an unknown or incomplete history of completing a 3-dose immunization series with Td-containing vaccines should begin or complete a primary immunization series including a Tdap dose. Adults should receive a Td booster every 10 years.  Varicella vaccine. An adult without evidence of immunity to varicella should receive 2 doses or a second dose if she  has previously received 1 dose. Pregnant females who do not have evidence of immunity should receive the first dose after pregnancy. This first dose should be obtained before leaving the health care facility. The second dose should be obtained 4 8 weeks after the first dose.  Human papillomavirus (HPV) vaccine. Females aged 42 26 years who have not received the vaccine previously should obtain the 3-dose series. The vaccine is not recommended for use in pregnant females. However, pregnancy testing is not needed before receiving a dose. If a female is found to be pregnant after receiving a dose, no treatment is needed. In that case, the remaining doses should be delayed until after the pregnancy. Immunization is recommended for any person with an immunocompromised condition through the age of 43 years if she did not get any or all doses earlier. During the 3-dose series, the second dose should be obtained 4 8 weeks after the first dose. The third dose should be obtained 24 weeks after the first dose and 16 weeks after the second dose.  Zoster vaccine. One dose is recommended for adults aged 57 years or older unless certain conditions are present.  Measles, mumps, and rubella (MMR) vaccine. Adults born before [birth year] generally are considered immune to measles and mumps. Adults born in [birth year] or later should have 1 or more doses of MMR vaccine unless there is a contraindication to the vaccine or there is laboratory evidence of immunity to each of the three diseases. A routine second dose of MMR vaccine should be obtained at least 28 days after the first dose for students attending postsecondary schools, health care workers, or international travelers. People who received inactivated measles vaccine or an unknown type of measles vaccine during 1963 1967 should receive 2 doses of MMR vaccine. People who received inactivated mumps vaccine or an unknown type of mumps vaccine before 1979 and are at high risk for mumps  infection should consider immunization with 2 doses of MMR vaccine. For females of childbearing age, rubella immunity should be determined. If there is no evidence of immunity, females who are not pregnant should be vaccinated. If there is no evidence of immunity, females who are pregnant should delay immunization until after pregnancy. Unvaccinated health care workers born before [birth year] who lack laboratory evidence of measles, mumps, or rubella immunity or laboratory confirmation of disease should consider measles and mumps immunization with 2 doses of MMR vaccine or rubella immunization with 1 dose of MMR vaccine.  Pneumococcal 13-valent conjugate (PCV13) vaccine. When  indicated, a person who is uncertain of her immunization history and has no record of immunization should receive the PCV13 vaccine. An adult aged 67 years or older who has certain medical conditions and has not been previously immunized should receive 1 dose of PCV13 vaccine. This PCV13 should be followed with a dose of pneumococcal polysaccharide (PPSV23) vaccine. The PPSV23 vaccine dose should be obtained at least 8 weeks after the dose of PCV13 vaccine. An adult aged 53 years or older who has certain medical conditions and previously received 1 or more doses of PPSV23 vaccine should receive 1 dose of PCV13. The PCV13 vaccine dose should be obtained 1 or more years after the last PPSV23 vaccine dose.  Pneumococcal polysaccharide (PPSV23) vaccine. When PCV13 is also indicated, PCV13 should be obtained first. All adults aged 95 years and older should be immunized. An adult younger than age 81 years who has certain medical conditions should be immunized. Any person who resides in a nursing home or long-term care facility should be immunized. An adult smoker should be immunized. People with an immunocompromised condition and certain other conditions should receive both PCV13 and PPSV23 vaccines. People with human immunodeficiency virus (HIV)  infection should be immunized as soon as possible after diagnosis. Immunization during chemotherapy or radiation therapy should be avoided. Routine use of PPSV23 vaccine is not recommended for American Indians, [place] Natives, or people younger than 65 years unless there are medical conditions that require PPSV23 vaccine. When indicated, people who have unknown immunization and have no record of immunization should receive PPSV23 vaccine. One-time revaccination 5 years after the first dose of PPSV23 is recommended for people aged 44 64 years who have chronic kidney failure, nephrotic syndrome, asplenia, or immunocompromised conditions. People who received 1 2 doses of PPSV23 before age 36 years should receive another dose of PPSV23 vaccine at age 56 years or later if at least 5 years have passed since the previous dose. Doses of PPSV23 are not needed for people immunized with PPSV23 at or after age 9 years.  Meningococcal vaccine. Adults with asplenia or persistent complement component deficiencies should receive 2 doses of quadrivalent meningococcal conjugate (MenACWY-D) vaccine. The doses should be obtained at least 2 months apart. Microbiologists working with certain meningococcal bacteria, [place] recruits, people at risk during an outbreak, and people who travel to or live in countries with a high rate of meningitis should be immunized. A first-year college student up through age 14 years who is living in a residence hall should receive a dose if she did not receive a dose on or after her 16th birthday. Adults who have certain high-risk conditions should receive one or more doses of vaccine.  Hepatitis A vaccine. Adults who wish to be protected from this disease, have certain high-risk conditions, work with hepatitis A-infected animals, work in hepatitis A research labs, or travel to or work in countries with a high rate of hepatitis A should be immunized. Adults who were previously unvaccinated and who  anticipate close contact with an international adoptee during the first 60 days after arrival in the [place] from a country with a high rate of hepatitis A should be immunized.  Hepatitis B vaccine.  Adults who wish to be protected from this disease, have certain high-risk conditions, may be exposed to blood or other infectious body fluids, are household contacts or sex partners of hepatitis B positive people, are clients or workers in certain care facilities, or travel to or work in countries with  a high rate of hepatitis B should be immunized.  Haemophilus influenzae type b (Hib) vaccine. A previously unvaccinated person with asplenia or sickle cell disease or having a scheduled splenectomy should receive 1 dose of Hib vaccine. Regardless of previous immunization, a recipient of a hematopoietic stem cell transplant should receive a 3-dose series 6 12 months after her successful transplant. Hib vaccine is not recommended for adults with HIV infection.  Preventive Services / Frequency Ages 53 to 39years  Blood pressure check.** / Every 1 to 2 years.  Lipid and cholesterol check.** / Every 5 years beginning at age 19.  Clinical breast exam.** / Every 3 years for women in their 1s and 70s.  BRCA-related cancer risk assessment.** / For women who have family members with a BRCA-related cancer (breast, ovarian, tubal, or peritoneal cancers).  Pap test.** / Every 2 years from ages 6 through 48. Every 3 years starting at age 54 through age 1 or 9 with a history of 3 consecutive normal Pap tests.  HPV screening.** / Every 3 years from ages 72 through ages 58 to 66 with a history of 3 consecutive normal Pap tests.  Hepatitis C blood test.** / For any individual with known risks for hepatitis C.  Skin self-exam. / Monthly.  Influenza vaccine. / Every year.  Tetanus, diphtheria, and acellular pertussis (Tdap, Td) vaccine.** / Consult your health care provider. Pregnant women should receive  1 dose of Tdap vaccine during each pregnancy. 1 dose of Td every 10 years.  Varicella vaccine.** / Consult your health care provider. Pregnant females who do not have evidence of immunity should receive the first dose after pregnancy.  HPV vaccine. / 3 doses over 6 months, if 46 and younger. The vaccine is not recommended for use in pregnant females. However, pregnancy testing is not needed before receiving a dose.  Measles, mumps, rubella (MMR) vaccine.** / You need at least 1 dose of MMR if you were born in 1957 or later. You may also need a 2nd dose. For females of childbearing age, rubella immunity should be determined. If there is no evidence of immunity, females who are not pregnant should be vaccinated. If there is no evidence of immunity, females who are pregnant should delay immunization until after pregnancy.  Pneumococcal 13-valent conjugate (PCV13) vaccine.** / Consult your health care provider.  Pneumococcal polysaccharide (PPSV23) vaccine.** / 1 to 2 doses if you smoke cigarettes or if you have certain conditions.  Meningococcal vaccine.** / 1 dose if you are age 36 to 54 years and a Market researcher living in a residence hall, or have one of several medical conditions, you need to get vaccinated against meningococcal disease. You may also need additional booster doses.  Hepatitis A vaccine.** / Consult your health care provider.  Hepatitis B vaccine.** / Consult your health care provider.  Haemophilus influenzae type b (Hib) vaccine.** / Consult your health care provider.  Ages 16 to 64years  Blood pressure check.** / Every 1 to 2 years.  Lipid and cholesterol check.** / Every 5 years beginning at age 70 years.  Lung cancer screening. / Every year if you are aged 84 80 years and have a 30-pack-year history of smoking and currently smoke or have quit within the past 15 years. Yearly screening is stopped once you have quit smoking for at least 15 years or develop a  health problem that would prevent you from having lung cancer treatment.  Clinical breast exam.** / Every year after age 68  years.  BRCA-related cancer risk assessment.** / For women who have family members with a BRCA-related cancer (breast, ovarian, tubal, or peritoneal cancers).  Mammogram.** / Every year beginning at age 66 years and continuing for as long as you are in good health. Consult with your health care provider.  Pap test.** / Every 3 years starting at age 38 years through age 61 or 47 years with a history of 3 consecutive normal Pap tests.  HPV screening.** / Every 3 years from ages 65 years through ages 62 to 19 years with a history of 3 consecutive normal Pap tests.  Fecal occult blood test (FOBT) of stool. / Every year beginning at age 18 years and continuing until age 87 years. You may not need to do this test if you get a colonoscopy every 10 years.  Flexible sigmoidoscopy or colonoscopy.** / Every 5 years for a flexible sigmoidoscopy or every 10 years for a colonoscopy beginning at age 18 years and continuing until age 20 years.  Hepatitis C blood test.** / For all people born from [birth year] through 1967 and any individual with known risks for hepatitis C.  Skin self-exam. / Monthly.  Influenza vaccine. / Every year.  Tetanus, diphtheria, and acellular pertussis (Tdap/Td) vaccine.** / Consult your health care provider. Pregnant women should receive 1 dose of Tdap vaccine during each pregnancy. 1 dose of Td every 10 years.  Varicella vaccine.** / Consult your health care provider. Pregnant females who do not have evidence of immunity should receive the first dose after pregnancy.  Zoster vaccine.** / 1 dose for adults aged 66 years or older.  Measles, mumps, rubella (MMR) vaccine.** / You need at least 1 dose of MMR if you were born in 1957 or later. You may also need a 2nd dose. For females of childbearing age, rubella immunity should be determined. If there is no  evidence of immunity, females who are not pregnant should be vaccinated. If there is no evidence of immunity, females who are pregnant should delay immunization until after pregnancy.  Pneumococcal 13-valent conjugate (PCV13) vaccine.** / Consult your health care provider.  Pneumococcal polysaccharide (PPSV23) vaccine.** / 1 to 2 doses if you smoke cigarettes or if you have certain conditions.  Meningococcal vaccine.** / Consult your health care provider.  Hepatitis A vaccine.** / Consult your health care provider.  Hepatitis B vaccine.** / Consult your health care provider.  Haemophilus influenzae type b (Hib) vaccine.** / Consult your health care provider.  Ages 54 years and over  Blood pressure check.** / Every 1 to 2 years.  Lipid and cholesterol check.** / Every 5 years beginning at age 5 years.  Lung cancer screening. / Every year if you are aged 51 80 years and have a 30-pack-year history of smoking and currently smoke or have quit within the past 15 years. Yearly screening is stopped once you have quit smoking for at least 15 years or develop a health problem that would prevent you from having lung cancer treatment.  Clinical breast exam.** / Every year after age 50 years.  BRCA-related cancer risk assessment.** / For women who have family members with a BRCA-related cancer (breast, ovarian, tubal, or peritoneal cancers).  Mammogram.** / Every year beginning at age 21 years and continuing for as long as you are in good health. Consult with your health care provider.  Pap test.** / Every 3 years starting at age 31 years through age 15 or 42 years with 3 consecutive normal Pap tests. Testing can  be stopped between 65 and 70 years with 3 consecutive normal Pap tests and no abnormal Pap or HPV tests in the past 10 years.  HPV screening.** / Every 3 years from ages 51 years through ages 31 or 31 years with a history of 3 consecutive normal Pap tests. Testing can be stopped between  65 and 70 years with 3 consecutive normal Pap tests and no abnormal Pap or HPV tests in the past 10 years.  Fecal occult blood test (FOBT) of stool. / Every year beginning at age 79 years and continuing until age 67 years. You may not need to do this test if you get a colonoscopy every 10 years.  Flexible sigmoidoscopy or colonoscopy.** / Every 5 years for a flexible sigmoidoscopy or every 10 years for a colonoscopy beginning at age 63 years and continuing until age 22 years.  Hepatitis C blood test.** / For all people born from [birth year] through 1967 and any individual with known risks for hepatitis C.  Osteoporosis screening.** / A one-time screening for women ages 54 years and over and women at risk for fractures or osteoporosis.  Skin self-exam. / Monthly.  Influenza vaccine. / Every year.  Tetanus, diphtheria, and acellular pertussis (Tdap/Td) vaccine.** / 1 dose of Td every 10 years.  Varicella vaccine.** / Consult your health care provider.  Zoster vaccine.** / 1 dose for adults aged 64 years or older.  Pneumococcal 13-valent conjugate (PCV13) vaccine.** / Consult your health care provider.  Pneumococcal polysaccharide (PPSV23) vaccine.** / 1 dose for all adults aged 84 years and older.  Meningococcal vaccine.** / Consult your health care provider.  Hepatitis A vaccine.** / Consult your health care provider.  Hepatitis B vaccine.** / Consult your health care provider.  Haemophilus influenzae type b (Hib) vaccine.** / Consult your health care provider. ** Family history and personal history of risk and conditions may change your health care provider's recommendations. Document Released: 12/27/2001 Document Revised: 08/21/2013  [facility] Patient Information 2014 [place], [place].   EXERCISE AND DIET:  We recommended that you start or continue a regular exercise program for good health. Regular exercise means any activity that makes your heart beat faster and makes you sweat.  We  recommend exercising at least 30 minutes per day at least 3 days a week, preferably 5.  We also recommend a diet low in fat and sugar / carbohydrates.  Inactivity, poor dietary choices and obesity can cause diabetes, heart attack, stroke, and kidney damage, among others.     ALCOHOL AND SMOKING:  Women should limit their alcohol intake to no more than 7 drinks/beers/glasses of wine (combined, not each!) per week. Moderation of alcohol intake to this level decreases your risk of breast cancer and liver damage.  ( And of course, no recreational drugs are part of a healthy lifestyle.)  Also, you should not be smoking at all or even being exposed to second hand smoke. Most people know smoking can cause cancer, and various heart and lung diseases, but did you know it also contributes to weakening of your bones?  Aging of your skin?  Yellowing of your teeth and nails?   CALCIUM AND VITAMIN D:  Adequate intake of calcium and Vitamin D are recommended.  The recommendations for exact amounts of these supplements seem to change often, but generally speaking 600 mg of calcium (either carbonate or citrate) and 800 units of Vitamin D per day seems prudent. Certain women may benefit from higher intake of Vitamin D.  If  you are among these women, your doctor will have told you during your visit.     PAP SMEARS:  Pap smears, to check for cervical cancer or precancers,  have traditionally been done yearly, although recent scientific advances have shown that most women can have pap smears less often.  However, every woman still should have a physical exam from her gynecologist or primary care physician every year. It will include a breast check, inspection of the vulva and vagina to check for abnormal growths or skin changes, a visual exam of the cervix, and then an exam to evaluate the size and shape of the uterus and ovaries.  And after 64 years of age, a rectal exam is indicated to check for rectal cancers. We will  also provide age appropriate advice regarding health maintenance, like when you should have certain vaccines, screening for sexually transmitted diseases, bone density testing, colonoscopy, mammograms, etc.    MAMMOGRAMS:  All women over 33 years old should have a yearly mammogram. Many facilities now offer a "3D" mammogram, which may cost around $50 extra out of pocket. If possible,  we recommend you accept the option to have the 3D mammogram performed.  It both reduces the number of women who will be called back for extra views which then turn out to be normal, and it is better than the routine mammogram at detecting truly abnormal areas.     COLONOSCOPY:  Colonoscopy to screen for colon cancer is recommended for all women at age 72.  We know, you hate the idea of the prep.  We agree, BUT, having colon cancer and not knowing it is worse!!  Colon cancer so often starts as a polyp that can be seen and removed at colonscopy, which can quite literally save your life!  And if your first colonoscopy is normal and you have no family history of colon cancer, most women don't have to have it again for 10 years.  Once every ten years, you can do something that may end up saving your life, right?  We will be happy to help you get it scheduled when you are ready.  Be sure to check your insurance coverage so you understand how much it will cost.  It may be covered as a preventative service at no cost, but you should check your particular policy.

## 2018-04-30 NOTE — Progress Notes (Signed)
Impression and Recommendations:    1. Encounter for wellness examination   2. Pernicious anemia   3. Health education/counseling   4. Vaginitis, atrophic   5. Chronic pain of right knee with instability   6. Primary osteoarthritis of right knee   7. ETD (Eustachian tube dysfunction), right-   worse seasonally   8. Chronic vertigo-  seen by ENT in past   9. counseling for NSAID long-term use-  Mobic given to pt by Ortho for R knee OA   10. Hyperlipidemia, unspecified hyperlipidemia type   11. Renal agenesis, unspecified   12. Environmental and seasonal allergies   13. Postmenopausal   14. Osteoporosis screening      1) Anticipatory Guidance: Discussed importance of wearing a seatbelt while driving, not texting while driving; sunscreen when outside along with yearly skin surveillance; eating a well balanced and modest diet; physical activity at least 25 minutes per day or 150 min/ week of moderate to intense activity.  2) Immunizations / Screenings / Labs:  All immunizations and screenings that patient agrees to, are up-to-date per recommendations or will be updated today.  Patient understands the needs for q 33mo dental and yearly vision screens which pt will schedule independently. Obtain CBC, CMP, HgA1c, Lipid panel, TSH and vit D when fasting if not already done recently.   3) Weight:   Discussed goal of losing even 5-10% of current body weight which would improve overall feelings of well being and improve objective health data significantly.   Improve nutrient density of diet through increasing intake of fruits and vegetables and decreasing saturated/trans fats, white flour products and refined sugar products.   R Knee OA/pain/NSAID usage -This is a new problem to me.  -apply ice to the affected area for 15-20 minutes, 3-4 times per day.  -continue mobic PRN but limit usage of NSAIDs.   -continue meeting AHA exercise and dietary guidelines.   ETD/chronic vertigo/Env and  seasonal allergies -Use Neti pot or AYR sinus rinses, followed by 1 spray of flonase in each nostril.    -has been seen and evaluated by specialist before.  -discussed potential future referral for physical therapy.   HLD -continue zocor 40 mg qhs. Check FLP today as she is fasting.   Renal agenesis -stable at this time. Limit NSAIDs. Check CMP.   Postmenopausal/osteoporosis screening -Check labs.   Meds ordered this encounter  Medications  . simvastatin (ZOCOR) 40 MG tablet    Sig: TAKE 1 TABLET (40 MG TOTAL) BY MOUTH AT BEDTIME.    Dispense:  90 tablet    Refill:  3  . fluticasone (FLONASE) 50 MCG/ACT nasal spray    Sig: PLACE 2 SPRAYS IN EACH NOSTRIL DAILY.    Dispense:  16 g    Refill:  11  . cyanocobalamin (,VITAMIN B-12,) 1000 MCG/ML injection    Sig: Inject 1 mL (1,000 mcg total) into the muscle every 30 (thirty) days.    Dispense:  30 mL    Refill:  1    Orders Placed This Encounter  Procedures  . DG Bone Density  . CBC with Differential/Platelet  . Comprehensive metabolic panel  . Hemoglobin A1c  . Lipid panel  . TSH  . VITAMIN D 25 Hydroxy (Vit-D Deficiency, Fractures)  . T4, free  . Vitamin B12    Gross side effects, risk and benefits, and alternatives of medications discussed with patient.  Patient is aware that all medications have potential side effects and we are  unable to predict every side effect or drug-drug interaction that may occur.  Expresses verbal understanding and consents to current therapy plan and treatment regimen.  F-up preventative CPE in 1 year. F/up sooner for chronic care management as discussed and/or prn.  Please see orders placed and AVS handed out to patient at the end of our visit for further patient instructions/ counseling done pertaining to today's office visit.  This document serves as a record of services personally performed by Brooke Dance, DO. It was created on her behalf by Mayer Masker, a trained medical scribe.  The creation of this record is based on the scribe's personal observations and the provider's statements to them.   I have reviewed the above medical documentation for accuracy and completeness and I concur.  Brooke Hansen 04/30/18 11:29 AM    Subjective:    Chief Complaint  Patient presents with  . Annual Exam   CC: CPE  HPI: Brooke Hansen is a 62 y.o. female who presents to [facility] today a yearly health maintenance exam.  Health Maintenance Summary Reviewed and updated, unless pt declines services.  Colonoscopy:     COLONOSCOPY UTD, LAST DONE 02-10-15 , DUE TO REPEAT 10 YEARS Tobacco History Reviewed:   Y  CT scan for screening lung CA:   NA Abdominal Ultrasound:     NA Alcohol:    No concerns, no excessive use Exercise Habits:   meeting AHA guidelines- is working out daily. She does exercise videos at her church.  STD concerns:   None - not currently sexually active, no GU concerns.  Drug Use:   None Birth control method:   n/a Menses regular:     n/a Lumps or breast concerns:      no Breast Cancer Family History:      No Bone/ DEXA scan:    NA  Vision Eye exam UTD, last done Aug 2019.  Dental Goes q 6 months.   Skin No dermatologist.   Women's Health PAP: Normal negative high risk HPV, Last done 02-22-2016. No pertinent relevant FMHx.   MAMMOGRAM: LAST DONE 09-04-17, RESULTS wnl. REPEAT 1 YEAR.   R knee This is a chronic condition for her and began last year. She went to her referral where the provider started her on meloxicam which provided relief to her pain. She had an Xray done that showed OA. Per her, getting a shot in her knee is the next step in her treatment plan. She notes she only has 1 kidney on the L.   Ears 4 years prior she states she had incapacitating R ear ringing. She went to Dr. Lucia Gaskins, ENT, and was dx with a virus. She is having a recurrent flare-up of her symptoms that began last week. She will get  dizzy and nauseous and have to sleep in order to resolve her symptoms. She has vertigo and the room is spinning. It appears to be allergy related to her (she has congestion and rhinorrhea associated), and  she has done flonase and sinus rinses in the past.   Immunization History  Administered Date(s) Administered  . Influenza Split 08/21/2013  . Influenza Whole 11/14/2006  . Influenza-Unspecified 09/08/2014, 08/15/2015  . PPD Test 06/21/2013  . Td 11/14/2002  . Tdap 09/09/2013  . Zoster 12/07/2015  . Zoster Recombinat (Shingrix) 03/20/2017, 05/29/2017    Health Maintenance  Topic Date Due  . Hepatitis C Screening  05/01/2019 (Originally Nov 06, 1956)  . HIV Screening  05/01/2019 (  Originally 07/04/1971)  . INFLUENZA VACCINE  06/14/2018  . PAP SMEAR  02/22/2019  . MAMMOGRAM  09/05/2019  . TETANUS/TDAP  09/10/2023  . COLONOSCOPY  02/09/2025     Wt Readings from Last 3 Encounters:  04/30/18 139 lb 8 oz (63.3 kg)  03/06/18 143 lb 8 oz (65.1 kg)  03/20/17 142 lb (64.4 kg)   BP Readings from Last 3 Encounters:  04/30/18 128/85  03/06/18 115/78  03/20/17 118/70   Pulse Readings from Last 3 Encounters:  04/30/18 66  03/06/18 74  01/20/17 94     Past Medical History:  Diagnosis Date  . Allergy   . Anemia   . Asthma   . History of nephrectomy    right - nonfunctioning kidney  . Hyperlipidemia   . RLS (restless legs syndrome)   . Transfusion of blood during current hospitalization       Past Surgical History:  Procedure Laterality Date  . CESAREAN SECTION  1986  . NEPHRECTOMY Right 1975      Family History  Problem Relation Age of Onset  . Alcohol abuse Other   . Diabetes Other   . Hyperlipidemia Other   . Coronary artery disease Other   . Hyperlipidemia Mother   . Cancer Father        lung  . Heart disease Father   . Heart disease Maternal Grandfather   . Colon cancer Neg Hx       Social History   Substance and Sexual Activity  Drug Use No  ,    Social History   Substance and Sexual Activity  Alcohol Use Yes  . Alcohol/week: 1.2 oz  . Types: 2 Standard drinks or equivalent per week  ,   Social History   Tobacco Use  Smoking Status Former Smoker  . Packs/day: 1.00  . Years: 10.00  . Pack years: 10.00  . Last attempt to quit: 11/15/1983  . Years since quitting: 34.4  Smokeless Tobacco Never Used  ,   Social History   Substance and Sexual Activity  Sexual Activity Not Currently  . Birth control/protection: None    Current Outpatient Medications on File Prior to Visit  Medication Sig Dispense Refill  . albuterol (PROAIR HFA) 108 (90 Base) MCG/ACT inhaler Inhale 2 puffs into the lungs every 6 (six) hours as needed for wheezing or shortness of breath. 1 Inhaler 1  . Calcium Carbonate-Vitamin D (CALCIUM + D PO) Take by mouth daily.    . meloxicam (MOBIC) 15 MG tablet Take 1 tablet by mouth daily.  2   No current facility-administered medications on file prior to visit.     Allergies: Septra [sulfamethoxazole-trimethoprim] and Ampicillin  Review of Systems: General:   Denies fever, chills, unexplained weight loss.  Optho/Auditory:   Denies visual changes, blurred vision/LOV Respiratory:   Denies SOB, DOE more than baseline levels.  Cardiovascular:   Denies chest pain, palpitations, new onset peripheral edema  Gastrointestinal:   Denies nausea, vomiting, diarrhea.  Genitourinary: Denies dysuria, freq/ urgency, flank pain or discharge from genitals.  Endocrine:     Denies hot or cold intolerance, polyuria, polydipsia. Musculoskeletal:   Denies unexplained myalgias, gait problems.  Skin:  Denies rash, suspicious lesions Neurological:     Denies dizziness, unexplained weakness, numbness  Psychiatric/Behavioral:   Denies mood changes, suicidal or homicidal ideations, hallucinations    Objective:    Blood pressure 128/85, pulse 66, height 5\' 4"  (1.626 m), weight 139 lb 8 oz (63.3 kg), SpO2 99 %.  Body mass index  is 23.95 kg/m. General Appearance:    Alert, cooperative, no distress, appears stated age  Head:    Normocephalic, without obvious abnormality, atraumatic  Eyes:    PERRL, conjunctiva/corneas clear, EOM's intact, fundi    benign, both eyes  Ears:    Normal TM's and external ear canals, both ears  Nose:   Nares normal, septum midline, mucosa normal, no drainage    or sinus tenderness  Throat:   Lips w/o lesion, mucosa moist, and tongue normal; teeth and   gums normal  Neck:   Supple, symmetrical, trachea midline, no adenopathy;    thyroid:  no enlargement/tenderness/nodules; no carotid   bruit or JVD  Back:     Symmetric, no curvature, ROM normal, no CVA tenderness  Lungs:     Clear to auscultation bilaterally, respirations unlabored, no       Wh/ R/ R  Chest Wall:    No tenderness or gross deformity; normal excursion   Heart:    Regular rate and rhythm, S1 and S2 normal, no murmur, rub   or gallop  Breast Exam:    No tenderness, masses, or nipple abnormality b/l; no d/c.  Abdomen:     Soft, non-tender, bowel sounds active all four quadrants, NO   G/R/R, no masses, no organomegaly  Genitalia:  Deferred.  Rectal:  Deferred.  Extremities:   Extremities normal, atraumatic, no cyanosis or gross edema  Pulses:   2+ and symmetric all extremities  Skin:   Warm, dry, Skin color, texture, turgor normal, no obvious rashes or lesions Psych: No HI/SI, judgement and insight good, Euthymic mood. Full Affect.  Neurologic:   CNII-XII intact, normal strength, sensation and reflexes    Throughout

## 2018-05-01 LAB — CBC WITH DIFFERENTIAL/PLATELET
Basophils Absolute: 0 10*3/uL (ref 0.0–0.2)
Basos: 0 %
EOS (ABSOLUTE): 0.3 10*3/uL (ref 0.0–0.4)
EOS: 4 %
HEMATOCRIT: 39.8 % (ref 34.0–46.6)
Hemoglobin: 12.7 g/dL (ref 11.1–15.9)
Immature Grans (Abs): 0 10*3/uL (ref 0.0–0.1)
Immature Granulocytes: 0 %
LYMPHS ABS: 1.6 10*3/uL (ref 0.7–3.1)
Lymphs: 21 %
MCH: 29 pg (ref 26.6–33.0)
MCHC: 31.9 g/dL (ref 31.5–35.7)
MCV: 91 fL (ref 79–97)
MONOS ABS: 0.7 10*3/uL (ref 0.1–0.9)
Monocytes: 10 %
NEUTROS ABS: 4.8 10*3/uL (ref 1.4–7.0)
NEUTROS PCT: 65 %
PLATELETS: 259 10*3/uL (ref 150–450)
RBC: 4.38 x10E6/uL (ref 3.77–5.28)
RDW: 14.4 % (ref 12.3–15.4)
WBC: 7.5 10*3/uL (ref 3.4–10.8)

## 2018-05-01 LAB — TSH: TSH: 1.59 u[IU]/mL (ref 0.450–4.500)

## 2018-05-01 LAB — COMPREHENSIVE METABOLIC PANEL
A/G RATIO: 1.7 (ref 1.2–2.2)
ALK PHOS: 85 IU/L (ref 39–117)
ALT: 16 IU/L (ref 0–32)
AST: 21 IU/L (ref 0–40)
Albumin: 4.3 g/dL (ref 3.6–4.8)
BUN/Creatinine Ratio: 25 (ref 12–28)
BUN: 30 mg/dL — ABNORMAL HIGH (ref 8–27)
CALCIUM: 10.1 mg/dL (ref 8.7–10.3)
CHLORIDE: 101 mmol/L (ref 96–106)
CO2: 26 mmol/L (ref 20–29)
Creatinine, Ser: 1.21 mg/dL — ABNORMAL HIGH (ref 0.57–1.00)
GFR calc Af Amer: 55 mL/min/{1.73_m2} — ABNORMAL LOW (ref >59)
GFR, EST NON AFRICAN AMERICAN: 48 mL/min/{1.73_m2} — AB (ref >59)
GLOBULIN, TOTAL: 2.5 g/dL (ref 1.5–4.5)
Glucose: 94 mg/dL (ref 65–99)
POTASSIUM: 5.3 mmol/L — AB (ref 3.5–5.2)
SODIUM: 141 mmol/L (ref 134–144)
Total Protein: 6.8 g/dL (ref 6.0–8.5)

## 2018-05-01 LAB — LIPID PANEL
Chol/HDL Ratio: 2.9 ratio (ref 0.0–4.4)
Cholesterol, Total: 189 mg/dL (ref 100–199)
HDL: 65 mg/dL (ref >39)
LDL Calculated: 108 mg/dL — ABNORMAL HIGH (ref 0–99)
Triglycerides: 80 mg/dL (ref 0–149)
VLDL CHOLESTEROL CAL: 16 mg/dL (ref 5–40)

## 2018-05-01 LAB — HEMOGLOBIN A1C
Est. average glucose Bld gHb Est-mCnc: 126 mg/dL
Hgb A1c MFr Bld: 6 % — ABNORMAL HIGH (ref 4.8–5.6)

## 2018-05-01 LAB — T4, FREE: FREE T4: 1.07 ng/dL (ref 0.82–1.77)

## 2018-05-01 LAB — VITAMIN B12: Vitamin B-12: 462 pg/mL (ref 232–1245)

## 2018-05-01 LAB — VITAMIN D 25 HYDROXY (VIT D DEFICIENCY, FRACTURES): VIT D 25 HYDROXY: 37.1 ng/mL (ref 30.0–100.0)

## 2018-05-07 ENCOUNTER — Ambulatory Visit (INDEPENDENT_AMBULATORY_CARE_PROVIDER_SITE_OTHER): Payer: BC Managed Care – PPO | Admitting: Family Medicine

## 2018-05-07 ENCOUNTER — Encounter: Payer: Self-pay | Admitting: Family Medicine

## 2018-05-07 VITALS — BP 116/79 | HR 73 | Ht 64.0 in | Wt 137.0 lb

## 2018-05-07 DIAGNOSIS — D510 Vitamin B12 deficiency anemia due to intrinsic factor deficiency: Secondary | ICD-10-CM

## 2018-05-07 DIAGNOSIS — E559 Vitamin D deficiency, unspecified: Secondary | ICD-10-CM

## 2018-05-07 DIAGNOSIS — N183 Chronic kidney disease, stage 3 unspecified: Secondary | ICD-10-CM | POA: Insufficient documentation

## 2018-05-07 DIAGNOSIS — R7303 Prediabetes: Secondary | ICD-10-CM | POA: Diagnosis not present

## 2018-05-07 DIAGNOSIS — Q602 Renal agenesis, unspecified: Secondary | ICD-10-CM | POA: Diagnosis not present

## 2018-05-07 DIAGNOSIS — E785 Hyperlipidemia, unspecified: Secondary | ICD-10-CM

## 2018-05-07 DIAGNOSIS — Z791 Long term (current) use of non-steroidal anti-inflammatories (NSAID): Secondary | ICD-10-CM

## 2018-05-07 DIAGNOSIS — R7989 Other specified abnormal findings of blood chemistry: Secondary | ICD-10-CM

## 2018-05-07 MED ORDER — VITAMIN D (ERGOCALCIFEROL) 1.25 MG (50000 UNIT) PO CAPS: 50000.0000 [IU] | ORAL_CAPSULE | ORAL | 10 refills | Status: DC

## 2018-05-07 NOTE — Patient Instructions (Addendum)
Please avoid all NSAIDs as we previously discussed.  Please continue with Tylenol as needed for joint pain.  Please drink one half your weight in ounces of water per day plus, and extra bottle water for every 1/2 hour of exercise that you sweat to    Risk factors for prediabetes and type 2 diabetes  Researchers don't fully understand why some people develop prediabetes and type 2 diabetes and others don't.  It's clear that certain factors increase the risk, however, including:  Weight. The more fatty tissue you have, the more resistant your cells become to insulin.  Inactivity. The less active you are, the greater your risk. Physical activity helps you control your weight, uses up glucose as energy and makes your cells more sensitive to insulin.  Family history. Your risk increases if a parent or sibling has type 2 diabetes.  Race. Although it's unclear why, people of certain races - including blacks, Hispanics, American Indians and Asian-Americans - are at higher risk.  Age. Your risk increases as you get older. This may be because you tend to exercise less, lose muscle mass and gain weight as you age. But type 2 diabetes is also increasing dramatically among children, adolescents and younger adults.  Gestational diabetes. If you developed gestational diabetes when you were pregnant, your risk of developing prediabetes and type 2 diabetes later increases. If you gave birth to a baby weighing more than 9 pounds (4 kilograms), you're also at risk of type 2 diabetes.  Polycystic ovary syndrome. For women, having polycystic ovary syndrome - a common condition characterized by irregular menstrual periods, excess hair growth and obesity - increases the risk of diabetes.  High blood pressure. Having blood pressure over 140/90 millimeters of mercury (mm Hg) is linked to an increased risk of type 2 diabetes.  Abnormal cholesterol and triglyceride levels. If you have low levels of high-density lipoprotein  (HDL), or "good," cholesterol, your risk of type 2 diabetes is higher. Triglycerides are another type of fat carried in the blood. People with high levels of triglycerides have an increased risk of type 2 diabetes. Your doctor can let you know what your cholesterol and triglyceride levels are.  A good guide to good carbs: The glycemic index ---If you have diabetes, or at risk for diabetes, you know all too well that when you eat carbohydrates, your blood sugar goes up. The total amount of carbs you consume at a meal or in a snack mostly determines what your blood sugar will do. But the food itself also plays a role. A serving of white rice has almost the same effect as eating pure table sugar - a quick, high spike in blood sugar. A serving of lentils has a slower, smaller effect.  ---Picking good sources of carbs can help you control your blood sugar and your weight. Even if you don't have diabetes, eating healthier carbohydrate-rich foods can help ward off a host of chronic conditions, from heart disease to various cancers to, well, diabetes.  ---One way to choose foods is with the glycemic index (GI). This tool measures how much a food boosts blood sugar.  The glycemic index rates the effect of a specific amount of a food on blood sugar compared with the same amount of pure glucose. A food with a glycemic index of 28 boosts blood sugar only 28% as much as pure glucose. One with a GI of 95 acts like pure glucose.    High glycemic foods result in a quick spike  in insulin and blood sugar (also known as blood glucose).  Low glycemic foods have a slower, smaller effect- these are healthier for you.   Using the glycemic index Using the glycemic index is easy: choose foods in the low GI category instead of those in the high GI category (see below), and go easy on those in between. Low glycemic index (GI of 55 or less): Most fruits and vegetables, beans, minimally processed grains, pasta, low-fat dairy  foods, and nuts.  Moderate glycemic index (GI 56 to 69): White and sweet potatoes, corn, white rice, couscous, breakfast cereals such as Cream of Wheat and Mini Wheats.  High glycemic index (GI of 70 or higher): White bread, rice cakes, most crackers, bagels, cakes, doughnuts, croissants, most packaged breakfast cereals. You can see the values for 100 commons foods and get links to more at www.health.CheapToothpicks.si.  Swaps for lowering glycemic index  Instead of this high-glycemic index food Eat this lower-glycemic index food  White rice Brown rice or converted rice  Instant oatmeal Steel-cut oats  Cornflakes Bran flakes  Baked potato Pasta, bulgur  White bread Whole-grain bread  Corn Peas or leafy greens       Prediabetes Eating Plan  Prediabetes--also called impaired glucose tolerance or impaired fasting glucose--is a condition that causes blood sugar (blood glucose) levels to be higher than normal. Following a healthy diet can help to keep prediabetes under control. It can also help to lower the risk of type 2 diabetes and heart disease, which are increased in people who have prediabetes. Along with regular exercise, a healthy diet:  Promotes weight loss.  Helps to control blood sugar levels.  Helps to improve the way that the body uses insulin.   WHAT DO I NEED TO KNOW ABOUT THIS EATING PLAN?   Use the glycemic index (GI) to plan your meals. The index tells you how quickly a food will raise your blood sugar. Choose low-GI foods. These foods take a longer time to raise blood sugar.  Pay close attention to the amount of carbohydrates in the food that you eat. Carbohydrates increase blood sugar levels.  Keep track of how many calories you take in. Eating the right amount of calories will help you to achieve a healthy weight. Losing about 7 percent of your starting weight can help to prevent type 2 diabetes.  You may want to follow a Mediterranean diet. This diet includes  a lot of vegetables, lean meats or fish, whole grains, fruits, and healthy oils and fats.   WHAT FOODS CAN I EAT?  Grains Whole grains, such as whole-wheat or whole-grain breads, crackers, cereals, and pasta. Unsweetened oatmeal. Bulgur. Barley. Quinoa. Brown rice. Corn or whole-wheat flour tortillas or taco shells. Vegetables Lettuce. Spinach. Peas. Beets. Cauliflower. Cabbage. Broccoli. Carrots. Tomatoes. Squash. Eggplant. Herbs. Peppers. Onions. Cucumbers. Brussels sprouts. Fruits Berries. Bananas. Apples. Oranges. Grapes. Papaya. Mango. Pomegranate. Kiwi. Grapefruit. Cherries. Meats and Other Protein Sources Seafood. Lean meats, such as chicken and [place] or lean cuts of pork and beef. Tofu. Eggs. Nuts. Beans. Dairy Low-fat or fat-free dairy products, such as yogurt, cottage cheese, and cheese. Beverages Water. Tea. Coffee. Sugar-free or diet soda. Seltzer water. Milk. Milk alternatives, such as soy or almond milk. Condiments Mustard. Relish. Low-fat, low-sugar ketchup. Low-fat, low-sugar barbecue sauce. Low-fat or fat-free mayonnaise. Sweets and Desserts Sugar-free or low-fat pudding. Sugar-free or low-fat ice cream and other frozen treats. Fats and Oils Avocado. Walnuts. Olive oil. The items listed above may not be a complete  list of recommended foods or beverages. Contact your dietitian for more options.    WHAT FOODS ARE NOT RECOMMENDED?  Grains Refined white flour and flour products, such as bread, pasta, snack foods, and cereals. Beverages Sweetened drinks, such as sweet iced tea and soda. Sweets and Desserts Baked goods, such as cake, cupcakes, pastries, cookies, and cheesecake. The items listed above may not be a complete list of foods and beverages to avoid. Contact your dietitian for more information.   This information is not intended to replace advice given to you by your health care provider. Make sure you discuss any questions you have with your health care  provider.   Document Released: 03/17/2015 Document Reviewed: 03/17/2015 Elsevier Interactive Patient Education [employer].

## 2018-05-07 NOTE — Progress Notes (Signed)
Assessment and plan:  1. Prediabetes   2. Hyperlipidemia, unspecified hyperlipidemia type   3. Renal agenesis, unspecified-in 1970s patient had a "shriveled up disease kidney "; status post right nephrectomy    4. Stage III chronic kidney disease (HCC)   5. Pernicious anemia   6. counseling for NSAID long-term use-  Mobic given to pt by Ortho for R knee OA   7. Vitamin D insufficiency   8. Elevated serum creatinine     1. Labs from 04/30/2018 reviewed in detail today  - B12 - Patient with history of pernicious anemia. B12 currently WNL.  - CBC = WNL.  - TSH = WNL.  - T4 = WNL.  - Vitamin D Insufficiency - Once a week Vitamin D prescription provided today, 50000 IU's.  Calcium - Continue with Calcium/Vitamin D supplementation as well. - Advised patient to take at least 1200 Calcium daily.  - A1C = 6.0 - Prediabetes - Discussed that at 6.0, patient is sensitive to glucose and at risk of developing diabetes.  Reviewed that a value of 6.4 or more means a diabetes diagnosis. . - Patient notes that she does have a family history of diabetes.  - In 4-6 months, re-check A1c.  - Consume less sweets and be mindful of diet.  Reduce high glycemic index foods.  Cut back on sweets, candies, cookies, cakes, and pies.  - Handout provided today on prediabetes.  - Kidney Health GFR = down, at 48. BUN = elevated, 30.     Patient only has one kidney. - Reviewed the importance of avoiding NSAID's to improve kidney function. - Advised patient to take tylenol as needed, no NSAID's.   Hydration & Kidney Health - Discussed the importance of adequate hydration with kidney function. - Patient should drink at least half of her body weight in ounces of water per day - 70 oz. - Drink 1 additional bottle of water for every 30 minutes of exercise. - Advised patient to cut down on caffeinated beverages.  2. Cholesterol -  Continue Zocor as prescribed.  Triglycerides = 80 HDL = 65 LDL = 108  - Advised patient to consume healthy fats and continue to exercise regularly.  - Avoid eating saturated and trans fats, such as pork, beef. - Avoid eating fatty carbs, such as donuts, cakes, Danishes.  ASCVD Risk Score: The 10-year ASCVD risk score Brooke Hansen DC Brooke Hansen., et al., 2013) is: 3.3%   Values used to calculate the score:     Age: 16 years     Sex: Female     Is Non-Hispanic African American: No     Diabetic: No     Tobacco smoker: No     Systolic Blood Pressure: 628 mmHg     Is BP treated: No     HDL Cholesterol: 65 mg/dL     Total Cholesterol: 189 mg/dL  3. Preventative Health Maintenance - Advised patient to continue working toward lifestyle modifications to improve health.    - Continue with active lifestyle.  Recommended that the patient eventually strive for at least 150 minutes of cardio per week according to the [facility].   - Healthy dietary habits encouraged, including low-carb, and high amounts of lean protein in diet.   - Patient should also consume adequate amounts of water - half of body weight in oz of water per day.  4. Follow-Up - Repeat BMP with GFR, A1c, and Vitamin D in 3-4 months. - Work on continued exercise  and increasing hydration.   Education and routine counseling performed. Handouts provided.  Orders Placed This Encounter  Procedures  . VITAMIN D 25 Hydroxy (Vit-D Deficiency, Fractures)  . Hemoglobin A1c  . Basic metabolic panel    Meds ordered this encounter  Medications  . Vitamin D, Ergocalciferol, (DRISDOL) 50000 units CAPS capsule    Sig: Take 1 capsule (50,000 Units total) by mouth every 7 (seven) days.    Dispense:  12 capsule    Refill:  10     Return for Follow-up 4 months, repeat labs 2 to 3 days prior.  No need to fast.   Anticipatory guidance and routine counseling done re: condition, txmnt options and need for follow up. All questions of patient's were  answered.   Gross side effects, risk and benefits, and alternatives of medications discussed with patient.  Patient is aware that all medications have potential side effects and we are unable to predict every sideeffect or drug-drug interaction that may occur.  Expresses verbal understanding and consents to current therapy plan and treatment regiment.  Please see AVS handed out to patient at the end of our visit for additional patient instructions/ counseling done pertaining to today's office visit.  Note: This document was prepared using Dragon voice recognition software and may include unintentional dictation errors.  This document serves as a record of services personally performed by Mellody Dance, DO. It was created on her behalf by [name], a trained medical scribe. The creation of this record is based on the scribe's personal observations and the provider's statements to them.   I have reviewed the above medical documentation for accuracy and completeness and I concur.  Mellody Dance 05/13/18 10:20 PM   ----------------------------------------------------------------------------------------------------------------------  Subjective:   CC:   Brooke Hansen is a 62 y.o. female who presents to [place] at [facility] today for review and discussion of recent bloodwork that was done.  1. All recent blood work that we ordered was reviewed with patient today.  Patient was counseled on all abnormalities and we discussed dietary and lifestyle changes that could help those values (also medications when appropriate).  Extensive health counseling performed and all patient's concerns/ questions were addressed.   Cholesterol Patient confirms that she continues taking Zocor nightly.  B12 Supplementation History of pernicious anemia - takes B12 supplement every 30 days. Notes "I seem to have way more energy than most of my friends" and attributes this to  B12.  NSAID Cessation - Kidney Health Patient was formerly taking mobic.  She hasn't taken it since her last encounter at the clinic, 7 days ago.  Lifestyle Patient notes that she doesn't consume too many carbs or refined sugars.  However, she does admit that she enjoys sweets.  Patient states that she probably drinks only 32 oz of water per day, but "I do drink other things."   Wt Readings from Last 3 Encounters:  05/07/18 137 lb (62.1 kg)  04/30/18 139 lb 8 oz (63.3 kg)  03/06/18 143 lb 8 oz (65.1 kg)   BP Readings from Last 3 Encounters:  05/07/18 116/79  04/30/18 128/85  03/06/18 115/78   Pulse Readings from Last 3 Encounters:  05/07/18 73  04/30/18 66  03/06/18 74   BMI Readings from Last 3 Encounters:  05/07/18 23.52 kg/m  04/30/18 23.95 kg/m  03/06/18 24.63 kg/m     Patient Care Team    Relationship Specialty Notifications Start End  Mellody Dance, DO PCP - General  Family Medicine  03/06/18   Luberta Mutter, MD Consulting Physician Ophthalmology  03/06/18    Depression screen [facility] 2/9 05/07/2018 04/30/2018 03/06/2018  Decreased Interest 0 0 0  Down, Depressed, Hopeless 0 0 0  PHQ - 2 Score 0 0 0  Altered sleeping 0 0 0  Tired, decreased energy 0 0 0  Change in appetite 0 0 0  Feeling bad or failure about yourself  0 0 0  Trouble concentrating 0 0 0  Moving slowly or fidgety/restless 0 0 0  Suicidal thoughts 0 0 0  PHQ-9 Score 0 0 0  Difficult doing work/chores Not difficult at all Not difficult at all Not difficult at all    Full medical history updated and reviewed in the office today  Patient Active Problem List   Diagnosis Date Noted  . Prediabetes 05/07/2018  . Stage III chronic kidney disease ([place]) 05/07/2018  . Vitamin D insufficiency 05/07/2018  . ETD (Eustachian tube dysfunction), right 04/30/2018  . Chronic vertigo-  seen by ENT in past 04/30/2018  . Primary osteoarthritis of right knee 04/30/2018  . counseling for NSAID long-term use-   Mobic given to pt by Ortho for R knee OA 04/30/2018  . Renal agenesis, unspecified-in 1970s patient had a "shriveled up disease kidney "; status post right nephrectomy  04/30/2018  . Reactive airway disease that is not asthma-due to environmental allergens 03/06/2018  . Environmental and seasonal allergies 03/06/2018  . Chronic pain of right knee with instability 03/06/2018  . Status post lateral meniscectomy of right knee 03/06/2018  . Other instability, right knee 03/06/2018  . Screening for cardiovascular condition 03/20/2017  . Routine general medical examination at a health care facility 02/22/2016  . Right-sided chest wall pain 10/05/2015  . Pernicious anemia 10/20/2014  . Dysplastic nevus of trunk 04/25/2013  . Vaginitis, atrophic 04/07/2011  . Hyperlipidemia 09/11/2007  . Allergic rhinitis 09/11/2007    Past Medical History:  Diagnosis Date  . Allergy   . Anemia   . Asthma   . History of nephrectomy    right - nonfunctioning kidney  . Hyperlipidemia   . RLS (restless legs syndrome)   . Transfusion of blood during current hospitalization     Past Surgical History:  Procedure Laterality Date  . CESAREAN SECTION  1986  . NEPHRECTOMY Right 1975    Social History   Tobacco Use  . Smoking status: Former Smoker    Packs/day: 1.00    Years: 10.00    Pack years: 10.00    Last attempt to quit: 11/15/1983    Years since quitting: 34.5  . Smokeless tobacco: Never Used  Substance Use Topics  . Alcohol use: Yes    Alcohol/week: 1.2 oz    Types: 2 Standard drinks or equivalent per week    Family Hx: Family History  Problem Relation Age of Onset  . Alcohol abuse Other   . Diabetes Other   . Hyperlipidemia Other   . Coronary artery disease Other   . Hyperlipidemia Mother   . Cancer Father        lung  . Heart disease Father   . Heart disease Maternal Grandfather   . Colon cancer Neg Hx      Medications: Current Outpatient Medications  Medication Sig Dispense  Refill  . albuterol (PROAIR HFA) 108 (90 Base) MCG/ACT inhaler Inhale 2 puffs into the lungs every 6 (six) hours as needed for wheezing or shortness of breath. 1 Inhaler 1  . Calcium Carbonate-Vitamin  D (CALCIUM + D PO) Take by mouth daily.    . cyanocobalamin (,VITAMIN B-12,) 1000 MCG/ML injection Inject 1 mL (1,000 mcg total) into the muscle every 30 (thirty) days. 30 mL 1  . fluticasone (FLONASE) 50 MCG/ACT nasal spray PLACE 2 SPRAYS IN EACH NOSTRIL DAILY. 16 g 11  . meloxicam (MOBIC) 15 MG tablet Take 1 tablet by mouth daily.  2  . simvastatin (ZOCOR) 40 MG tablet TAKE 1 TABLET (40 MG TOTAL) BY MOUTH AT BEDTIME. 90 tablet 3  . Vitamin D, Ergocalciferol, (DRISDOL) 50000 units CAPS capsule Take 1 capsule (50,000 Units total) by mouth every 7 (seven) days. 12 capsule 10   No current facility-administered medications for this visit.     Allergies:  Allergies  Allergen Reactions  . Septra [Sulfamethoxazole-Trimethoprim] Other (See Comments)    Per pt: unknown  . Ampicillin Rash     Review of Systems: General:   No F/C, wt loss Pulm:   No DIB, SOB, pleuritic chest pain Card:  No CP, palpitations Abd:  No n/v/d or pain Ext:  No inc edema from baseline  Objective:  Blood pressure 116/79, pulse 73, height 5\' 4"  (1.626 m), weight 137 lb (62.1 kg), SpO2 98 %. Body mass index is 23.52 kg/m. Gen:   Well NAD, A and O *3 HEENT:    [place]/AT, EOMI,  MMM Lungs:   Normal work of breathing. CTA B/L, no Wh, rhonchi Heart:   RRR, S1, S2 WNL's, no MRG Abd:   No gross distention Exts:    warm, pink,  Brisk capillary refill, warm and well perfused.  Psych:    No HI/SI, judgement and insight good, Euthymic mood. Full Affect.   Recent Results (from the past 2160 hour(s))  CBC with Differential/Platelet     Status: None   Collection Time: 04/30/18  9:10 AM  Result Value Ref Range   WBC 7.5 3.4 - 10.8 x10E3/uL   RBC 4.38 3.77 - 5.28 x10E6/uL   Hemoglobin 12.7 11.1 - 15.9 g/dL   Hematocrit 39.8  34.0 - 46.6 %   MCV 91 79 - 97 fL   MCH 29.0 26.6 - 33.0 pg   MCHC 31.9 31.5 - 35.7 g/dL   RDW 14.4 12.3 - 15.4 %   Platelets 259 150 - 450 x10E3/uL   Neutrophils 65 Not Estab. %   Lymphs 21 Not Estab. %   Monocytes 10 Not Estab. %   Eos 4 Not Estab. %   Basos 0 Not Estab. %   Neutrophils Absolute 4.8 1.4 - 7.0 x10E3/uL   Lymphocytes Absolute 1.6 0.7 - 3.1 x10E3/uL   Monocytes Absolute 0.7 0.1 - 0.9 x10E3/uL   EOS (ABSOLUTE) 0.3 0.0 - 0.4 x10E3/uL   Basophils Absolute 0.0 0.0 - 0.2 x10E3/uL   Immature Granulocytes 0 Not Estab. %   Immature Grans (Abs) 0.0 0.0 - 0.1 x10E3/uL  Comprehensive metabolic panel     Status: Abnormal   Collection Time: 04/30/18  9:10 AM  Result Value Ref Range   Glucose 94 65 - 99 mg/dL   BUN 30 (H) 8 - 27 mg/dL   Creatinine, Ser 1.21 (H) 0.57 - 1.00 mg/dL   GFR calc non Af Amer 48 (L) >59 mL/min/1.73   GFR calc Af Amer 55 (L) >59 mL/min/1.73   BUN/Creatinine Ratio 25 12 - 28   Sodium 141 134 - 144 mmol/L   Potassium 5.3 (H) 3.5 - 5.2 mmol/L   Chloride 101 96 - 106 mmol/L   CO2  26 20 - 29 mmol/L   Calcium 10.1 8.7 - 10.3 mg/dL   Total Protein 6.8 6.0 - 8.5 g/dL   Albumin 4.3 3.6 - 4.8 g/dL   Globulin, Total 2.5 1.5 - 4.5 g/dL   Albumin/Globulin Ratio 1.7 1.2 - 2.2   Bilirubin Total <0.2 0.0 - 1.2 mg/dL   Alkaline Phosphatase 85 39 - 117 IU/L   AST 21 0 - 40 IU/L   ALT 16 0 - 32 IU/L  Hemoglobin A1c     Status: Abnormal   Collection Time: 04/30/18  9:10 AM  Result Value Ref Range   Hgb A1c MFr Bld 6.0 (H) 4.8 - 5.6 %    Comment:          Prediabetes: 5.7 - 6.4          Diabetes: >6.4          Glycemic control for adults with diabetes: <7.0    Est. average glucose Bld gHb Est-mCnc 126 mg/dL  Lipid panel     Status: Abnormal   Collection Time: 04/30/18  9:10 AM  Result Value Ref Range   Cholesterol, Total 189 100 - 199 mg/dL   Triglycerides 80 0 - 149 mg/dL   HDL 65 >39 mg/dL   VLDL Cholesterol Cal 16 5 - 40 mg/dL   LDL Calculated 108 (H)  0 - 99 mg/dL   Chol/HDL Ratio 2.9 0.0 - 4.4 ratio    Comment:                                   T. Chol/HDL Ratio                                             Men  Women                               1/2 Avg.Risk  3.4    3.3                                   Avg.Risk  5.0    4.4                                2X Avg.Risk  9.6    7.1                                3X Avg.Risk 23.4   11.0   TSH     Status: None   Collection Time: 04/30/18  9:10 AM  Result Value Ref Range   TSH 1.590 0.450 - 4.500 uIU/mL  VITAMIN D 25 Hydroxy (Vit-D Deficiency, Fractures)     Status: None   Collection Time: 04/30/18  9:10 AM  Result Value Ref Range   Vit D, 25-Hydroxy 37.1 30.0 - 100.0 ng/mL    Comment: Vitamin D deficiency has been defined by the [place] and an Endocrine Society practice guideline as a level of serum 25-OH vitamin D less than 20 ng/mL (1,2). The Endocrine Society went on to further define vitamin D insufficiency  as a level between 21 and 29 ng/mL (2). 1. IOM (Institute of Medicine). 2010. Dietary reference    intakes for calcium and D. [place]: The    [employer]. 2. Holick MF, Binkley [place], Bischoff-Ferrari HA, et al.    Evaluation, treatment, and prevention of vitamin D    deficiency: an Endocrine Society clinical practice    guideline. JCEM. 2011 Jul; 96(7):1911-30.   T4, free     Status: None   Collection Time: 04/30/18  9:10 AM  Result Value Ref Range   Free T4 1.07 0.82 - 1.77 ng/dL  Vitamin B12     Status: None   Collection Time: 04/30/18  9:10 AM  Result Value Ref Range   Vitamin B-12 462 232 - 1,245 pg/mL

## 2018-05-24 ENCOUNTER — Other Ambulatory Visit: Payer: Self-pay | Admitting: Family Medicine

## 2018-05-24 DIAGNOSIS — Z780 Asymptomatic menopausal state: Secondary | ICD-10-CM

## 2018-05-24 DIAGNOSIS — E2839 Other primary ovarian failure: Secondary | ICD-10-CM

## 2018-05-24 DIAGNOSIS — Z13820 Encounter for screening for osteoporosis: Secondary | ICD-10-CM

## 2018-05-31 ENCOUNTER — Other Ambulatory Visit: Payer: Self-pay | Admitting: Family Medicine

## 2018-05-31 DIAGNOSIS — Z1231 Encounter for screening mammogram for malignant neoplasm of breast: Secondary | ICD-10-CM

## 2018-09-10 ENCOUNTER — Ambulatory Visit
Admission: RE | Admit: 2018-09-10 | Discharge: 2018-09-10 | Disposition: A | Discharge status: Home / Self Care | Payer: BC Managed Care – PPO | Source: Ambulatory Visit | Attending: Family Medicine | Admitting: Family Medicine

## 2018-09-10 DIAGNOSIS — Z1231 Encounter for screening mammogram for malignant neoplasm of breast: Secondary | ICD-10-CM

## 2018-09-10 DIAGNOSIS — Z13820 Encounter for screening for osteoporosis: Secondary | ICD-10-CM

## 2018-09-10 DIAGNOSIS — E2839 Other primary ovarian failure: Secondary | ICD-10-CM

## 2018-09-10 DIAGNOSIS — Z780 Asymptomatic menopausal state: Secondary | ICD-10-CM

## 2019-02-05 ENCOUNTER — Other Ambulatory Visit: Payer: Self-pay

## 2019-02-05 NOTE — Telephone Encounter (Signed)
Pharmacy sent refill request for proair inhaler.  Last filled by a previous provider. LOV 05/07/2018 Please review and advise. MPulliam, CMA/RT(R)

## 2019-02-06 MED ORDER — ALBUTEROL SULFATE HFA 108 (90 BASE) MCG/ACT IN AERS: 2.0000 | INHALATION_SPRAY | Freq: Four times a day (QID) | RESPIRATORY_TRACT | 1 refills | Status: DC | PRN

## 2019-05-02 ENCOUNTER — Encounter: Payer: Self-pay | Admitting: Family Medicine

## 2019-05-02 ENCOUNTER — Other Ambulatory Visit (HOSPITAL_COMMUNITY)
Admission: RE | Admit: 2019-05-02 | Discharge: 2019-05-02 | Disposition: A | Discharge status: Home / Self Care | Payer: BC Managed Care – PPO | Source: Ambulatory Visit | Attending: Family Medicine | Admitting: Family Medicine

## 2019-05-02 ENCOUNTER — Other Ambulatory Visit: Payer: Self-pay

## 2019-05-02 ENCOUNTER — Ambulatory Visit (INDEPENDENT_AMBULATORY_CARE_PROVIDER_SITE_OTHER): Payer: BC Managed Care – PPO | Admitting: Family Medicine

## 2019-05-02 ENCOUNTER — Other Ambulatory Visit (INDEPENDENT_AMBULATORY_CARE_PROVIDER_SITE_OTHER): Payer: BC Managed Care – PPO

## 2019-05-02 VITALS — BP 116/74 | HR 66 | Temp 99.1°F | Ht 64.0 in | Wt 139.9 lb

## 2019-05-02 DIAGNOSIS — L570 Actinic keratosis: Secondary | ICD-10-CM

## 2019-05-02 DIAGNOSIS — Z9119 Patient's noncompliance with other medical treatment and regimen: Secondary | ICD-10-CM | POA: Diagnosis not present

## 2019-05-02 DIAGNOSIS — Z91199 Patient's noncompliance with other medical treatment and regimen due to unspecified reason: Secondary | ICD-10-CM

## 2019-05-02 DIAGNOSIS — X32XXXA Exposure to sunlight, initial encounter: Secondary | ICD-10-CM

## 2019-05-02 DIAGNOSIS — E785 Hyperlipidemia, unspecified: Secondary | ICD-10-CM

## 2019-05-02 DIAGNOSIS — Z124 Encounter for screening for malignant neoplasm of cervix: Secondary | ICD-10-CM | POA: Diagnosis present

## 2019-05-02 DIAGNOSIS — Z0000 Encounter for general adult medical examination without abnormal findings: Secondary | ICD-10-CM | POA: Diagnosis not present

## 2019-05-02 DIAGNOSIS — R7303 Prediabetes: Secondary | ICD-10-CM

## 2019-05-02 DIAGNOSIS — Z1283 Encounter for screening for malignant neoplasm of skin: Secondary | ICD-10-CM

## 2019-05-02 DIAGNOSIS — E559 Vitamin D deficiency, unspecified: Secondary | ICD-10-CM

## 2019-05-02 DIAGNOSIS — Z7189 Other specified counseling: Secondary | ICD-10-CM

## 2019-05-02 DIAGNOSIS — N6320 Unspecified lump in the left breast, unspecified quadrant: Secondary | ICD-10-CM | POA: Diagnosis not present

## 2019-05-02 DIAGNOSIS — D510 Vitamin B12 deficiency anemia due to intrinsic factor deficiency: Secondary | ICD-10-CM

## 2019-05-02 NOTE — Patient Instructions (Signed)
Preventive Care for Adults, Female  A healthy lifestyle and preventive care can promote health and wellness. Preventive health guidelines for women include the following key practices.   A routine yearly physical is a good way to check with your health care provider about your health and preventive screening. It is a chance to share any concerns and updates on your health and to receive a thorough exam.   Visit your dentist for a routine exam and preventive care every 6 months. Brush your teeth twice a day and floss once a day. Good oral hygiene prevents tooth decay and gum disease.   The frequency of eye exams is based on your age, health, family medical history, use of contact lenses, and other factors. Follow your health care provider's recommendations for frequency of eye exams.   Eat a healthy diet. Foods like vegetables, fruits, whole grains, low-fat dairy products, and lean protein foods contain the nutrients you need without too many calories. Decrease your intake of foods high in solid fats, added sugars, and salt. Eat the right amount of calories for you.Get information about a proper diet from your health care provider, if necessary.   Regular physical exercise is one of the most important things you can do for your health. Most adults should get at least 150 minutes of moderate-intensity exercise (any activity that increases your heart rate and causes you to sweat) each week. In addition, most adults need muscle-strengthening exercises on 2 or more days a week.   Maintain a healthy weight. The body mass index (BMI) is a screening tool to identify possible weight problems. It provides an estimate of body fat based on height and weight. Your health care provider can find your BMI, and can help you achieve or maintain a healthy weight.For adults 20 years and older:   - A BMI below 18.5 is considered underweight.   - A BMI of 18.5 to 24.9 is normal.   - A BMI of 25 to 29.9 is  considered overweight.   - A BMI of 30 and above is considered obese.   Maintain normal blood lipids and cholesterol levels by exercising and minimizing your intake of trans and saturated fats.  Eat a balanced diet with plenty of fruit and vegetables. Blood tests for lipids and cholesterol should begin at age 20 and be repeated every 5 years minimum.  If your lipid or cholesterol levels are high, you are over 40, or you are at high risk for heart disease, you may need your cholesterol levels checked more frequently.Ongoing high lipid and cholesterol levels should be treated with medicines if diet and exercise are not working.   If you smoke, find out from your health care provider how to quit. If you do not use tobacco, do not start.   Lung cancer screening is recommended for adults aged 55-80 years who are at high risk for developing lung cancer because of a history of smoking. A yearly low-dose CT scan of the lungs is recommended for people who have at least a 30-pack-year history of smoking and are a current smoker or have quit within the past 15 years. A pack year of smoking is smoking an average of 1 pack of cigarettes a day for 1 year (for example: 1 pack a day for 30 years or 2 packs a day for 15 years). Yearly screening should continue until the smoker has stopped smoking for at least 15 years. Yearly screening should be stopped for people who develop a   health problem that would prevent them from having lung cancer treatment.   If you are pregnant, do not drink alcohol. If you are breastfeeding, be very cautious about drinking alcohol. If you are not pregnant and choose to drink alcohol, do not have more than 1 drink per day. One drink is considered to be 12 ounces (355 mL) of beer, 5 ounces (148 mL) of wine, or 1.5 ounces (44 mL) of liquor.   Avoid use of street drugs. Do not share needles with anyone. Ask for help if you need support or instructions about stopping the use of  drugs.   High blood pressure causes heart disease and increases the risk of stroke. Your blood pressure should be checked at least yearly.  Ongoing high blood pressure should be treated with medicines if weight loss and exercise do not work.   If you are 69-55 years old, ask your health care provider if you should take aspirin to prevent strokes.   Diabetes screening involves taking a blood sample to check your fasting blood sugar level. This should be done once every 3 years, after age 38, if you are within normal weight and without risk factors for diabetes. Testing should be considered at a younger age or be carried out more frequently if you are overweight and have at least 1 risk factor for diabetes.   Breast cancer screening is essential preventive care for women. You should practice "breast self-awareness."  This means understanding the normal appearance and feel of your breasts and may include breast self-examination.  Any changes detected, no matter how small, should be reported to a health care provider.  Women in their 80s and 30s should have a clinical breast exam (CBE) by a health care provider as part of a regular health exam every 1 to 3 years.  After age 66, women should have a CBE every year.  Starting at age 1, women should consider having a mammogram (breast X-ray test) every year.  Women who have a family history of breast cancer should talk to their health care provider about genetic screening.  Women at a high risk of breast cancer should talk to their health care providers about having an MRI and a mammogram every year.   -Breast cancer gene (BRCA)-related cancer risk assessment is recommended for women who have family members with BRCA-related cancers. BRCA-related cancers include breast, ovarian, tubal, and peritoneal cancers. Having family members with these cancers may be associated with an increased risk for harmful changes (mutations) in the breast cancer genes BRCA1 and  BRCA2. Results of the assessment will determine the need for genetic counseling and BRCA1 and BRCA2 testing.   The Pap test is a screening test for cervical cancer. A Pap test can show cell changes on the cervix that might become cervical cancer if left untreated. A Pap test is a procedure in which cells are obtained and examined from the lower end of the uterus (cervix).   - Women should have a Pap test starting at age 57.   - Between ages 90 and 70, Pap tests should be repeated every 2 years.   - Beginning at age 63, you should have a Pap test every 3 years as long as the past 3 Pap tests have been normal.   - Some women have medical problems that increase the chance of getting cervical cancer. Talk to your health care provider about these problems. It is especially important to talk to your health care provider if a  new problem develops soon after your last Pap test. In these cases, your health care provider may recommend more frequent screening and Pap tests.   - The above recommendations are the same for women who have or have not gotten the vaccine for human papillomavirus (HPV).   - If you had a hysterectomy for a problem that was not cancer or a condition that could lead to cancer, then you no longer need Pap tests. Even if you no longer need a Pap test, a regular exam is a good idea to make sure no other problems are starting.   - If you are between ages 36 and 66 years, and you have had normal Pap tests going back 10 years, you no longer need Pap tests. Even if you no longer need a Pap test, a regular exam is a good idea to make sure no other problems are starting.   - If you have had past treatment for cervical cancer or a condition that could lead to cancer, you need Pap tests and screening for cancer for at least 20 years after your treatment.   - If Pap tests have been discontinued, risk factors (such as a new sexual partner) need to be reassessed to determine if screening should  be resumed.   - The HPV test is an additional test that may be used for cervical cancer screening. The HPV test looks for the virus that can cause the cell changes on the cervix. The cells collected during the Pap test can be tested for HPV. The HPV test could be used to screen women aged 70 years and older, and should be used in women of any age who have unclear Pap test results. After the age of 67, women should have HPV testing at the same frequency as a Pap test.   Colorectal cancer can be detected and often prevented. Most routine colorectal cancer screening begins at the age of 57 years and continues through age 26 years. However, your health care provider may recommend screening at an earlier age if you have risk factors for colon cancer. On a yearly basis, your health care provider may provide home test kits to check for hidden blood in the stool.  Use of a small camera at the end of a tube, to directly examine the colon (sigmoidoscopy or colonoscopy), can detect the earliest forms of colorectal cancer. Talk to your health care provider about this at age 23, when routine screening begins. Direct exam of the colon should be repeated every 5 -10 years through age 49 years, unless early forms of pre-cancerous polyps or small growths are found.   People who are at an increased risk for hepatitis B should be screened for this virus. You are considered at high risk for hepatitis B if:  -You were born in a country where hepatitis B occurs often. Talk with your health care provider about which countries are considered high risk.  - Your parents were born in a high-risk country and you have not received a shot to protect against hepatitis B (hepatitis B vaccine).  - You have HIV or AIDS.  - You use needles to inject street drugs.  - You live with, or have sex with, someone who has Hepatitis B.  - You get hemodialysis treatment.  - You take certain medicines for conditions like cancer, organ  transplantation, and autoimmune conditions.   Hepatitis C blood testing is recommended for all people born from [birth year] through 1965 and any individual  with known risks for hepatitis C.   Practice safe sex. Use condoms and avoid high-risk sexual practices to reduce the spread of sexually transmitted infections (STIs). STIs include gonorrhea, chlamydia, syphilis, trichomonas, herpes, HPV, and human immunodeficiency virus (HIV). Herpes, HIV, and HPV are viral illnesses that have no cure. They can result in disability, cancer, and death. Sexually active women aged 25 years and younger should be checked for chlamydia. Older women with new or multiple partners should also be tested for chlamydia. Testing for other STIs is recommended if you are sexually active and at increased risk.   Osteoporosis is a disease in which the bones lose minerals and strength with aging. This can result in serious bone fractures or breaks. The risk of osteoporosis can be identified using a bone density scan. Women ages 65 years and over and women at risk for fractures or osteoporosis should discuss screening with their health care providers. Ask your health care provider whether you should take a calcium supplement or vitamin D to There are also several preventive steps women can take to avoid osteoporosis and resulting fractures or to keep osteoporosis from worsening. -->Recommendations include:  Eat a balanced diet high in fruits, vegetables, calcium, and vitamins.  Get enough calcium. The recommended total intake of is 1,200 mg daily; for best absorption, if taking supplements, divide doses into 250-500 mg doses throughout the day. Of the two types of calcium, calcium carbonate is best absorbed when taken with food but calcium citrate can be taken on an empty stomach.  Get enough vitamin D. NAMS and the National Osteoporosis Foundation recommend at least 1,000 IU per day for women age 50 and over who are at risk of vitamin D  deficiency. Vitamin D deficiency can be caused by inadequate sun exposure (for example, those who live in northern latitudes).  Avoid alcohol and smoking. Heavy alcohol intake (more than 7 drinks per week) increases the risk of falls and hip fracture and women smokers tend to lose bone more rapidly and have lower bone mass than nonsmokers. Stopping smoking is one of the most important changes women can make to improve their health and decrease risk for disease.  Be physically active every day. Weight-bearing exercise (for example, fast walking, hiking, jogging, and weight training) may strengthen bones or slow the rate of bone loss that comes with aging. Balancing and muscle-strengthening exercises can reduce the risk of falling and fracture.  Consider therapeutic medications. Currently, several types of effective drugs are available. Healthcare providers can recommend the type most appropriate for each woman.  Eliminate environmental factors that may contribute to accidents. Falls cause nearly 90% of all osteoporotic fractures, so reducing this risk is an important bone-health strategy. Measures include ample lighting, removing obstructions to walking, using nonskid rugs on floors, and placing mats and/or grab bars in showers.  Be aware of medication side effects. Some common medicines make bones weaker. These include a type of steroid drug called glucocorticoids used for arthritis and asthma, some antiseizure drugs, certain sleeping pills, treatments for endometriosis, and some cancer drugs. An overactive thyroid gland or using too much thyroid hormone for an underactive thyroid can also be a problem. If you are taking these medicines, talk to your doctor about what you can do to help protect your bones.reduce the rate of osteoporosis.    Menopause can be associated with physical symptoms and risks. Hormone replacement therapy is available to decrease symptoms and risks. You should talk to your  health care provider   about whether hormone replacement therapy is right for you.   Use sunscreen. Apply sunscreen liberally and repeatedly throughout the day. You should seek shade when your shadow is shorter than you. Protect yourself by wearing long sleeves, pants, a wide-brimmed hat, and sunglasses year round, whenever you are outdoors.   Once a month, do a whole body skin exam, using a mirror to look at the skin on your back. Tell your health care provider of new moles, moles that have irregular borders, moles that are larger than a pencil eraser, or moles that have changed in shape or color.   -Stay current with required vaccines (immunizations).   Influenza vaccine. All adults should be immunized every year.  Tetanus, diphtheria, and acellular pertussis (Td, Tdap) vaccine. Pregnant women should receive 1 dose of Tdap vaccine during each pregnancy. The dose should be obtained regardless of the length of time since the last dose. Immunization is preferred during the 27th 36th week of gestation. An adult who has not previously received Tdap or who does not know her vaccine status should receive 1 dose of Tdap. This initial dose should be followed by tetanus and diphtheria toxoids (Td) booster doses every 10 years. Adults with an unknown or incomplete history of completing a 3-dose immunization series with Td-containing vaccines should begin or complete a primary immunization series including a Tdap dose. Adults should receive a Td booster every 10 years.  Varicella vaccine. An adult without evidence of immunity to varicella should receive 2 doses or a second dose if she has previously received 1 dose. Pregnant females who do not have evidence of immunity should receive the first dose after pregnancy. This first dose should be obtained before leaving the health care facility. The second dose should be obtained 4 8 weeks after the first dose.  Human papillomavirus (HPV) vaccine. Females aged 13 26  years who have not received the vaccine previously should obtain the 3-dose series. The vaccine is not recommended for use in pregnant females. However, pregnancy testing is not needed before receiving a dose. If a female is found to be pregnant after receiving a dose, no treatment is needed. In that case, the remaining doses should be delayed until after the pregnancy. Immunization is recommended for any person with an immunocompromised condition through the age of 26 years if she did not get any or all doses earlier. During the 3-dose series, the second dose should be obtained 4 8 weeks after the first dose. The third dose should be obtained 24 weeks after the first dose and 16 weeks after the second dose.  Zoster vaccine. One dose is recommended for adults aged 60 years or older unless certain conditions are present.  Measles, mumps, and rubella (MMR) vaccine. Adults born before 1957 generally are considered immune to measles and mumps. Adults born in 1957 or later should have 1 or more doses of MMR vaccine unless there is a contraindication to the vaccine or there is laboratory evidence of immunity to each of the three diseases. A routine second dose of MMR vaccine should be obtained at least 28 days after the first dose for students attending postsecondary schools, health care workers, or international travelers. People who received inactivated measles vaccine or an unknown type of measles vaccine during 1963 1967 should receive 2 doses of MMR vaccine. People who received inactivated mumps vaccine or an unknown type of mumps vaccine before 1979 and are at high risk for mumps infection should consider immunization with 2 doses of   MMR vaccine. For females of childbearing age, rubella immunity should be determined. If there is no evidence of immunity, females who are not pregnant should be vaccinated. If there is no evidence of immunity, females who are pregnant should delay immunization until after pregnancy.  Unvaccinated health care workers born before [birth year] who lack laboratory evidence of measles, mumps, or rubella immunity or laboratory confirmation of disease should consider measles and mumps immunization with 2 doses of MMR vaccine or rubella immunization with 1 dose of MMR vaccine.  Pneumococcal 13-valent conjugate (PCV13) vaccine. When indicated, a person who is uncertain of her immunization history and has no record of immunization should receive the PCV13 vaccine. An adult aged 54 years or older who has certain medical conditions and has not been previously immunized should receive 1 dose of PCV13 vaccine. This PCV13 should be followed with a dose of pneumococcal polysaccharide (PPSV23) vaccine. The PPSV23 vaccine dose should be obtained at least 8 weeks after the dose of PCV13 vaccine. An adult aged 58 years or older who has certain medical conditions and previously received 1 or more doses of PPSV23 vaccine should receive 1 dose of PCV13. The PCV13 vaccine dose should be obtained 1 or more years after the last PPSV23 vaccine dose.  Pneumococcal polysaccharide (PPSV23) vaccine. When PCV13 is also indicated, PCV13 should be obtained first. All adults aged 58 years and older should be immunized. An adult younger than age 65 years who has certain medical conditions should be immunized. Any person who resides in a nursing home or long-term care facility should be immunized. An adult smoker should be immunized. People with an immunocompromised condition and certain other conditions should receive both PCV13 and PPSV23 vaccines. People with human immunodeficiency virus (HIV) infection should be immunized as soon as possible after diagnosis. Immunization during chemotherapy or radiation therapy should be avoided. Routine use of PPSV23 vaccine is not recommended for American Indians, [place] Natives, or people younger than 65 years unless there are medical conditions that require PPSV23 vaccine. When indicated,  people who have unknown immunization and have no record of immunization should receive PPSV23 vaccine. One-time revaccination 5 years after the first dose of PPSV23 is recommended for people aged 70 64 years who have chronic kidney failure, nephrotic syndrome, asplenia, or immunocompromised conditions. People who received 1 2 doses of PPSV23 before age 32 years should receive another dose of PPSV23 vaccine at age 96 years or later if at least 5 years have passed since the previous dose. Doses of PPSV23 are not needed for people immunized with PPSV23 at or after age 55 years.  Meningococcal vaccine. Adults with asplenia or persistent complement component deficiencies should receive 2 doses of quadrivalent meningococcal conjugate (MenACWY-D) vaccine. The doses should be obtained at least 2 months apart. Microbiologists working with certain meningococcal bacteria, [employer] recruits, people at risk during an outbreak, and people who travel to or live in countries with a high rate of meningitis should be immunized. A first-year college student up through age 58 years who is living in a residence hall should receive a dose if she did not receive a dose on or after her 16th birthday. Adults who have certain high-risk conditions should receive one or more doses of vaccine.  Hepatitis A vaccine. Adults who wish to be protected from this disease, have certain high-risk conditions, work with hepatitis A-infected animals, work in hepatitis A research labs, or travel to or work in countries with a high rate of hepatitis A should be  immunized. Adults who were previously unvaccinated and who anticipate close contact with an international adoptee during the first 60 days after arrival in the [place] from a country with a high rate of hepatitis A should be immunized.  Hepatitis B vaccine.  Adults who wish to be protected from this disease, have certain high-risk conditions, may be exposed to blood or other infectious  body fluids, are household contacts or sex partners of hepatitis B positive people, are clients or workers in certain care facilities, or travel to or work in countries with a high rate of hepatitis B should be immunized.  Haemophilus influenzae type b (Hib) vaccine. A previously unvaccinated person with asplenia or sickle cell disease or having a scheduled splenectomy should receive 1 dose of Hib vaccine. Regardless of previous immunization, a recipient of a hematopoietic stem cell transplant should receive a 3-dose series 6 12 months after her successful transplant. Hib vaccine is not recommended for adults with HIV infection.  Preventive Services / Frequency Ages 6 to 39years  Blood pressure check.** / Every 1 to 2 years.  Lipid and cholesterol check.** / Every 5 years beginning at age 39.  Clinical breast exam.** / Every 3 years for women in their 61s and 62s.  BRCA-related cancer risk assessment.** / For women who have family members with a BRCA-related cancer (breast, ovarian, tubal, or peritoneal cancers).  Pap test.** / Every 2 years from ages 47 through 85. Every 3 years starting at age 34 through age 12 or 74 with a history of 3 consecutive normal Pap tests.  HPV screening.** / Every 3 years from ages 46 through ages 43 to 54 with a history of 3 consecutive normal Pap tests.  Hepatitis C blood test.** / For any individual with known risks for hepatitis C.  Skin self-exam. / Monthly.  Influenza vaccine. / Every year.  Tetanus, diphtheria, and acellular pertussis (Tdap, Td) vaccine.** / Consult your health care provider. Pregnant women should receive 1 dose of Tdap vaccine during each pregnancy. 1 dose of Td every 10 years.  Varicella vaccine.** / Consult your health care provider. Pregnant females who do not have evidence of immunity should receive the first dose after pregnancy.  HPV vaccine. / 3 doses over 6 months, if 64 and younger. The vaccine is not recommended for use in  pregnant females. However, pregnancy testing is not needed before receiving a dose.  Measles, mumps, rubella (MMR) vaccine.** / You need at least 1 dose of MMR if you were born in 1957 or later. You may also need a 2nd dose. For females of childbearing age, rubella immunity should be determined. If there is no evidence of immunity, females who are not pregnant should be vaccinated. If there is no evidence of immunity, females who are pregnant should delay immunization until after pregnancy.  Pneumococcal 13-valent conjugate (PCV13) vaccine.** / Consult your health care provider.  Pneumococcal polysaccharide (PPSV23) vaccine.** / 1 to 2 doses if you smoke cigarettes or if you have certain conditions.  Meningococcal vaccine.** / 1 dose if you are age 71 to 35 years and a Market researcher living in a residence hall, or have one of several medical conditions, you need to get vaccinated against meningococcal disease. You may also need additional booster doses.  Hepatitis A vaccine.** / Consult your health care provider.  Hepatitis B vaccine.** / Consult your health care provider.  Haemophilus influenzae type b (Hib) vaccine.** / Consult your health care provider.  Ages 55 to 64years  Blood pressure check.** / Every 1 to 2 years.  Lipid and cholesterol check.** / Every 5 years beginning at age 20 years.  Lung cancer screening. / Every year if you are aged 55 80 years and have a 30-pack-year history of smoking and currently smoke or have quit within the past 15 years. Yearly screening is stopped once you have quit smoking for at least 15 years or develop a health problem that would prevent you from having lung cancer treatment.  Clinical breast exam.** / Every year after age 40 years.  BRCA-related cancer risk assessment.** / For women who have family members with a BRCA-related cancer (breast, ovarian, tubal, or peritoneal cancers).  Mammogram.** / Every year beginning at age 40  years and continuing for as long as you are in good health. Consult with your health care provider.  Pap test.** / Every 3 years starting at age 30 years through age 65 or 70 years with a history of 3 consecutive normal Pap tests.  HPV screening.** / Every 3 years from ages 30 years through ages 65 to 70 years with a history of 3 consecutive normal Pap tests.  Fecal occult blood test (FOBT) of stool. / Every year beginning at age 50 years and continuing until age 75 years. You may not need to do this test if you get a colonoscopy every 10 years.  Flexible sigmoidoscopy or colonoscopy.** / Every 5 years for a flexible sigmoidoscopy or every 10 years for a colonoscopy beginning at age 50 years and continuing until age 75 years.  Hepatitis C blood test.** / For all people born from 1945 through 1965 and any individual with known risks for hepatitis C.  Skin self-exam. / Monthly.  Influenza vaccine. / Every year.  Tetanus, diphtheria, and acellular pertussis (Tdap/Td) vaccine.** / Consult your health care provider. Pregnant women should receive 1 dose of Tdap vaccine during each pregnancy. 1 dose of Td every 10 years.  Varicella vaccine.** / Consult your health care provider. Pregnant females who do not have evidence of immunity should receive the first dose after pregnancy.  Zoster vaccine.** / 1 dose for adults aged 60 years or older.  Measles, mumps, rubella (MMR) vaccine.** / You need at least 1 dose of MMR if you were born in 1957 or later. You may also need a 2nd dose. For females of childbearing age, rubella immunity should be determined. If there is no evidence of immunity, females who are not pregnant should be vaccinated. If there is no evidence of immunity, females who are pregnant should delay immunization until after pregnancy.  Pneumococcal 13-valent conjugate (PCV13) vaccine.** / Consult your health care provider.  Pneumococcal polysaccharide (PPSV23) vaccine.** / 1 to 2 doses if  you smoke cigarettes or if you have certain conditions.  Meningococcal vaccine.** / Consult your health care provider.  Hepatitis A vaccine.** / Consult your health care provider.  Hepatitis B vaccine.** / Consult your health care provider.  Haemophilus influenzae type b (Hib) vaccine.** / Consult your health care provider.  Ages 65 years and over  Blood pressure check.** / Every 1 to 2 years.  Lipid and cholesterol check.** / Every 5 years beginning at age 20 years.  Lung cancer screening. / Every year if you are aged 55 80 years and have a 30-pack-year history of smoking and currently smoke or have quit within the past 15 years. Yearly screening is stopped once you have quit smoking for at least 15 years or develop a health problem that   would prevent you from having lung cancer treatment.  Clinical breast exam.** / Every year after age 103 years.  BRCA-related cancer risk assessment.** / For women who have family members with a BRCA-related cancer (breast, ovarian, tubal, or peritoneal cancers).  Mammogram.** / Every year beginning at age 36 years and continuing for as long as you are in good health. Consult with your health care provider.  Pap test.** / Every 3 years starting at age 5 years through age 85 or 10 years with 3 consecutive normal Pap tests. Testing can be stopped between 65 and 70 years with 3 consecutive normal Pap tests and no abnormal Pap or HPV tests in the past 10 years.  HPV screening.** / Every 3 years from ages 93 years through ages 70 or 45 years with a history of 3 consecutive normal Pap tests. Testing can be stopped between 65 and 70 years with 3 consecutive normal Pap tests and no abnormal Pap or HPV tests in the past 10 years.  Fecal occult blood test (FOBT) of stool. / Every year beginning at age 8 years and continuing until age 45 years. You may not need to do this test if you get a colonoscopy every 10 years.  Flexible sigmoidoscopy or colonoscopy.** /  Every 5 years for a flexible sigmoidoscopy or every 10 years for a colonoscopy beginning at age 69 years and continuing until age 68 years.  Hepatitis C blood test.** / For all people born from [birth year] through 1967 and any individual with known risks for hepatitis C.  Osteoporosis screening.** / A one-time screening for women ages 7 years and over and women at risk for fractures or osteoporosis.  Skin self-exam. / Monthly.  Influenza vaccine. / Every year.  Tetanus, diphtheria, and acellular pertussis (Tdap/Td) vaccine.** / 1 dose of Td every 10 years.  Varicella vaccine.** / Consult your health care provider.  Zoster vaccine.** / 1 dose for adults aged 5 years or older.  Pneumococcal 13-valent conjugate (PCV13) vaccine.** / Consult your health care provider.  Pneumococcal polysaccharide (PPSV23) vaccine.** / 1 dose for all adults aged 74 years and older.  Meningococcal vaccine.** / Consult your health care provider.  Hepatitis A vaccine.** / Consult your health care provider.  Hepatitis B vaccine.** / Consult your health care provider.  Haemophilus influenzae type b (Hib) vaccine.** / Consult your health care provider. ** Family history and personal history of risk and conditions may change your health care provider's recommendations. Document Released: 12/27/2001 Document Revised: 08/21/2013  [facility] Patient Information 2014 [place], [place].   EXERCISE AND DIET:  We recommended that you start or continue a regular exercise program for good health. Regular exercise means any activity that makes your heart beat faster and makes you sweat.  We recommend exercising at least 30 minutes per day at least 3 days a week, preferably 5.  We also recommend a diet low in fat and sugar / carbohydrates.  Inactivity, poor dietary choices and obesity can cause diabetes, heart attack, stroke, and kidney damage, among others.     ALCOHOL AND SMOKING:  Women should limit their alcohol intake to no  more than 7 drinks/beers/glasses of wine (combined, not each!) per week. Moderation of alcohol intake to this level decreases your risk of breast cancer and liver damage.  ( And of course, no recreational drugs are part of a healthy lifestyle.)  Also, you should not be smoking at all or even being exposed to second hand smoke. Most people know smoking can  cause cancer, and various heart and lung diseases, but did you know it also contributes to weakening of your bones?  Aging of your skin?  Yellowing of your teeth and nails?   CALCIUM AND VITAMIN D:  Adequate intake of calcium and Vitamin D are recommended.  The recommendations for exact amounts of these supplements seem to change often, but generally speaking 600 mg of calcium (either carbonate or citrate) and 800 units of Vitamin D per day seems prudent. Certain women may benefit from higher intake of Vitamin D.  If you are among these women, your doctor will have told you during your visit.     PAP SMEARS:  Pap smears, to check for cervical cancer or precancers,  have traditionally been done yearly, although recent scientific advances have shown that most women can have pap smears less often.  However, every woman still should have a physical exam from her gynecologist or primary care physician every year. It will include a breast check, inspection of the vulva and vagina to check for abnormal growths or skin changes, a visual exam of the cervix, and then an exam to evaluate the size and shape of the uterus and ovaries.  And after 65 years of age, a rectal exam is indicated to check for rectal cancers. We will also provide age appropriate advice regarding health maintenance, like when you should have certain vaccines, screening for sexually transmitted diseases, bone density testing, colonoscopy, mammograms, etc.    MAMMOGRAMS:  All women over 71 years old should have a yearly mammogram. Many facilities now offer a "3D" mammogram, which may cost  around $50 extra out of pocket. If possible,  we recommend you accept the option to have the 3D mammogram performed.  It both reduces the number of women who will be called back for extra views which then turn out to be normal, and it is better than the routine mammogram at detecting truly abnormal areas.     COLONOSCOPY:  Colonoscopy to screen for colon cancer is recommended for all women at age 52.  We know, you hate the idea of the prep.  We agree, BUT, having colon cancer and not knowing it is worse!!  Colon cancer so often starts as a polyp that can be seen and removed at colonscopy, which can quite literally save your life!  And if your first colonoscopy is normal and you have no family history of colon cancer, most women don't have to have it again for 10 years.  Once every ten years, you can do something that may end up saving your life, right?  We will be happy to help you get it scheduled when you are ready.  Be sure to check your insurance coverage so you understand how much it will cost.  It may be covered as a preventative service at no cost, but you should check your particular policy.

## 2019-05-02 NOTE — Addendum Note (Signed)
Addended by: Lanier Prude D on: 05/02/2019 05:01 PM   Modules accepted: Orders

## 2019-05-02 NOTE — Progress Notes (Signed)
Impression and Recommendations:    1. Encounter for wellness examination   2. Counseling on health promotion and disease prevention   3. Personal history of noncompliance with medical treatment and regimen   4. Left breast lump- 3 oclock position just lateral to nipple   5. Screening exam for skin cancer   6. Senile solar keratosis    4)   L breast at 3 0'clock positon - palpation of tissue with prominence / lump- we will send to mammography a little earlier than planned.  Encouraged self breat exams   1)  Anticipatory Guidance: Discussed importance of wearing a seatbelt while driving, not texting while driving; sunscreen when outside along with yearly skin surveillance; eating a well balanced and modest diet; physical activity at least 25 minutes per day or 150 min/ week of moderate to intense activity.  2)  Immunizations / Screenings / Labs:  All immunizations and screenings that patient agrees to, are up-to-date per recommendations or will be updated today.  Patient understands the needs for q 64mo dental and yearly vision screens which pt will schedule independently. Obtain CBC, CMP, HgA1c, Lipid panel, TSH and vit D when fasting if not already done recently.   3)  Weight:   Weight is within normal limits.  Patient meeting very healthy lifestyle of exercise and prudent nutrition.  She does weightbearing exercises 5 days or more per week for over an hour and also does lift 2 to 3 days/week.  Improve nutrient density of diet through increasing intake of fruits and vegetables and decreasing saturated/trans fats, white flour products and refined sugar products.   Orders Placed This Encounter  Procedures  . MM DIGITAL SCREENING BILATERAL  . Ambulatory referral to Dermatology   Gross side effects, risk and benefits, and alternatives of medications discussed with patient.  Patient is aware that all medications have potential side effects and we are unable to predict every side effect or  drug-drug interaction that may occur.  Expresses verbal understanding and consents to current therapy plan and treatment regimen.   33mo- chronic dx mgt- chol, pre-DM, ckd etc;   yrly for wellness   Please see orders placed and AVS handed out to patient at the end of our visit for further patient instructions/ counseling done pertaining to today's office visit.   Mellody Dance, DO 3:29 PM     Subjective:    Chief Complaint  Patient presents with  . Annual Exam   CC: none  HPI: DELYNN OLVERA is a 63 y.o. female who presents to [place] [facility] today a yearly health maintenance exam.  Health Maintenance Summary Reviewed and updated, unless pt declines services.  Colonoscopy:    Up-to-date.  See chart Tobacco History Reviewed:   Y  CT scan for screening lung CA: Not applicable Alcohol:    No concerns, no excessive use Exercise Habits:   Patient is exceeding AHA guidelines-cardio as well as weight lifting STD concerns:   none Drug Use:   None Birth control method:   n/a Menses regular:     n/a Lumps or breast concerns:      None-patient does not check. Breast Cancer Family History:      No Bone/ DEXA scan:     Last done 09/10/2018-is due 09/02/2020; patient taking calcium and vitamin D supplements   Immunization History  Administered Date(s) Administered  . Influenza Split 08/21/2013  . Influenza Whole 11/14/2006  . Influenza-Unspecified 09/08/2014, 08/15/2015  .  PPD Test 06/21/2013  . Td 11/14/2002  . Tdap 09/09/2013  . Zoster 12/07/2015  . Zoster Recombinat (Shingrix) 03/20/2017, 05/29/2017    Health Maintenance  Topic Date Due  . PAP SMEAR-Modifier  05/02/2019 (Originally 02/22/2019)  . Hepatitis C Screening  05/01/2020 (Originally 1956/02/19)  . HIV Screening  05/01/2020 (Originally [birth year]1969)  . INFLUENZA VACCINE  06/15/2019  . MAMMOGRAM  09/10/2020  . TETANUS/TDAP  09/10/2023  . COLONOSCOPY  02/09/2025     Wt Readings from  Last 3 Encounters:  05/02/19 139 lb 14.4 oz (63.5 kg)  05/07/18 137 lb (62.1 kg)  04/30/18 139 lb 8 oz (63.3 kg)   BP Readings from Last 3 Encounters:  05/02/19 116/74  05/07/18 116/79  04/30/18 128/85   Pulse Readings from Last 3 Encounters:  05/02/19 66  05/07/18 73  04/30/18 66     Past Medical History:  Diagnosis Date  . Allergy   . Anemia   . Asthma   . History of nephrectomy    right - nonfunctioning kidney  . Hyperlipidemia   . RLS (restless legs syndrome)   . Transfusion of blood during current hospitalization       Past Surgical History:  Procedure Laterality Date  . CESAREAN SECTION  1986  . NEPHRECTOMY Right 1975      Family History  Problem Relation Age of Onset  . Alcohol abuse Other   . Diabetes Other   . Hyperlipidemia Other   . Coronary artery disease Other   . Hyperlipidemia Mother   . Cancer Father        lung  . Heart disease Father   . Heart disease Maternal Grandfather   . Colon cancer Neg Hx       Social History   Substance and Sexual Activity  Drug Use No  ,   Social History   Substance and Sexual Activity  Alcohol Use Yes  . Alcohol/week: 2.0 standard drinks  . Types: 2 Standard drinks or equivalent per week  ,   Social History   Tobacco Use  Smoking Status Former Smoker  . Packs/day: 1.00  . Years: 10.00  . Pack years: 10.00  . Quit date: 11/15/1983  . Years since quitting: 35.4  Smokeless Tobacco Never Used  ,   Social History   Substance and Sexual Activity  Sexual Activity Not Currently  . Birth control/protection: None    Current Outpatient Medications on File Prior to Visit  Medication Sig Dispense Refill  . albuterol (PROAIR HFA) 108 (90 Base) MCG/ACT inhaler Inhale 2 puffs into the lungs every 6 (six) hours as needed for wheezing or shortness of breath. 1 Inhaler 1  . Calcium Carbonate-Vitamin D (CALCIUM + D PO) Take by mouth daily.    . cyanocobalamin (,VITAMIN B-12,) 1000 MCG/ML injection  Inject 1 mL (1,000 mcg total) into the muscle every 30 (thirty) days. 30 mL 1  . fluticasone (FLONASE) 50 MCG/ACT nasal spray PLACE 2 SPRAYS IN EACH NOSTRIL DAILY. 16 g 11  . meloxicam (MOBIC) 15 MG tablet Take 1 tablet by mouth daily.  2  . simvastatin (ZOCOR) 40 MG tablet TAKE 1 TABLET (40 MG TOTAL) BY MOUTH AT BEDTIME. 90 tablet 3  . Vitamin D, Ergocalciferol, (DRISDOL) 50000 units CAPS capsule Take 1 capsule (50,000 Units total) by mouth every 7 (seven) days. 12 capsule 10   No current facility-administered medications on file prior to visit.     Allergies: Septra [sulfamethoxazole-trimethoprim] and Ampicillin  Review of Systems: General:  Denies fever, chills, unexplained weight loss.  Optho/Auditory:   Denies visual changes, blurred vision/LOV Respiratory:   Denies SOB, DOE more than baseline levels.  Cardiovascular:   Denies chest pain, palpitations, new onset peripheral edema  Gastrointestinal:   Denies nausea, vomiting, diarrhea.  Genitourinary: Denies dysuria, freq/ urgency, flank pain or discharge from genitals.  Endocrine:     Denies hot or cold intolerance, polyuria, polydipsia. Musculoskeletal:   Denies unexplained myalgias, joint swelling, unexplained arthralgias, gait problems.  Skin:  Denies rash, suspicious lesions Neurological:     Denies dizziness, unexplained weakness, numbness  Psychiatric/Behavioral:   Denies mood changes, suicidal or homicidal ideations, hallucinations    Objective:    Blood pressure 116/74, pulse 66, temperature 99.1 F (37.3 C), height 5\' 4"  (1.626 m), weight 139 lb 14.4 oz (63.5 kg), SpO2 98 %. Body mass index is 24.01 kg/m. General Appearance:    Alert, cooperative, no distress, appears stated age  Head:    Normocephalic, without obvious abnormality, atraumatic  Eyes:    PERRL, conjunctiva/corneas clear, EOM's intact, fundi    benign, both eyes  Ears:    Normal TM's and external ear canals, both ears  Nose:   Nares normal, septum  midline, mucosa normal, no drainage    or sinus tenderness  Throat:   Lips w/o lesion, mucosa moist, and tongue normal; teeth and   gums normal  Neck:   Supple, symmetrical, trachea midline, no adenopathy;    thyroid:  no enlargement/tenderness/nodules; no carotid   bruit or JVD  Back:     Symmetric, no curvature, ROM normal, no CVA tenderness  Lungs:     Clear to auscultation bilaterally, respirations unlabored, no       Wh/ R/ R  Chest Wall:    No tenderness or gross deformity; normal excursion   Heart:    Regular rate and rhythm, S1 and S2 normal, no murmur, rub   or gallop  Breast Exam:    No tenderness, masses, or nipple abnormality b/l; no d/c  Abdomen:     Soft, non-tender, bowel sounds active all four quadrants, NO   G/R/R, no masses, no organomegaly  Genitalia:    Ext genitalia: without lesion, no rash or discharge, tissue slightly friable no tenderness;  Cervix: WNL's w/o discharge or lesion-patient feels uncomfortable during exam; Adnexa:  No tenderness or palpable masses   Rectal:   Not done  Extremities:   Extremities normal, atraumatic, no cyanosis or gross edema  Pulses:   2+ and symmetric all extremities  Skin:   Warm, dry, Skin color, texture, turgor normal, no obvious rashes or lesions Psych: No HI/SI, judgement and insight good, Euthymic mood. Full Affect.  Neurologic:   CNII-XII intact, normal strength, sensation and reflexes    Throughout

## 2019-05-03 LAB — LIPID PANEL
Chol/HDL Ratio: 2.6 ratio (ref 0.0–4.4)
Cholesterol, Total: 193 mg/dL (ref 100–199)
HDL: 74 mg/dL (ref >39)
LDL Calculated: 108 mg/dL — ABNORMAL HIGH (ref 0–99)
Triglycerides: 53 mg/dL (ref 0–149)
VLDL Cholesterol Cal: 11 mg/dL (ref 5–40)

## 2019-05-03 LAB — CBC WITH DIFFERENTIAL/PLATELET
Basophils Absolute: 0 10*3/uL (ref 0.0–0.2)
Basos: 1 %
EOS (ABSOLUTE): 0.1 10*3/uL (ref 0.0–0.4)
Eos: 2 %
Hematocrit: 37.9 % (ref 34.0–46.6)
Hemoglobin: 12.8 g/dL (ref 11.1–15.9)
Immature Grans (Abs): 0 10*3/uL (ref 0.0–0.1)
Immature Granulocytes: 0 %
Lymphocytes Absolute: 1.7 10*3/uL (ref 0.7–3.1)
Lymphs: 34 %
MCH: 29.8 pg (ref 26.6–33.0)
MCHC: 33.8 g/dL (ref 31.5–35.7)
MCV: 88 fL (ref 79–97)
Monocytes Absolute: 0.6 10*3/uL (ref 0.1–0.9)
Monocytes: 11 %
Neutrophils Absolute: 2.6 10*3/uL (ref 1.4–7.0)
Neutrophils: 52 %
Platelets: 244 10*3/uL (ref 150–450)
RBC: 4.3 x10E6/uL (ref 3.77–5.28)
RDW: 13.1 % (ref 11.7–15.4)
WBC: 5.1 10*3/uL (ref 3.4–10.8)

## 2019-05-03 LAB — COMPREHENSIVE METABOLIC PANEL
ALT: 15 IU/L (ref 0–32)
AST: 22 IU/L (ref 0–40)
Albumin/Globulin Ratio: 2 (ref 1.2–2.2)
Albumin: 4.5 g/dL (ref 3.8–4.8)
Alkaline Phosphatase: 68 IU/L (ref 39–117)
BUN/Creatinine Ratio: 17 (ref 12–28)
BUN: 19 mg/dL (ref 8–27)
Bilirubin Total: 0.4 mg/dL (ref 0.0–1.2)
CO2: 26 mmol/L (ref 20–29)
Calcium: 10.3 mg/dL (ref 8.7–10.3)
Chloride: 102 mmol/L (ref 96–106)
Creatinine, Ser: 1.11 mg/dL — ABNORMAL HIGH (ref 0.57–1.00)
GFR calc Af Amer: 61 mL/min/{1.73_m2} (ref >59)
GFR calc non Af Amer: 53 mL/min/{1.73_m2} — ABNORMAL LOW (ref >59)
Globulin, Total: 2.3 g/dL (ref 1.5–4.5)
Glucose: 93 mg/dL (ref 65–99)
Potassium: 4.7 mmol/L (ref 3.5–5.2)
Sodium: 142 mmol/L (ref 134–144)
Total Protein: 6.8 g/dL (ref 6.0–8.5)

## 2019-05-03 LAB — T4, FREE: Free T4: 1.01 ng/dL (ref 0.82–1.77)

## 2019-05-03 LAB — VITAMIN D 25 HYDROXY (VIT D DEFICIENCY, FRACTURES): Vit D, 25-Hydroxy: 68.2 ng/mL (ref 30.0–100.0)

## 2019-05-03 LAB — TSH: TSH: 1.8 u[IU]/mL (ref 0.450–4.500)

## 2019-05-06 ENCOUNTER — Other Ambulatory Visit: Payer: Self-pay | Admitting: Family Medicine

## 2019-05-06 DIAGNOSIS — E785 Hyperlipidemia, unspecified: Secondary | ICD-10-CM

## 2019-05-07 ENCOUNTER — Other Ambulatory Visit: Payer: Self-pay | Admitting: Family Medicine

## 2019-05-07 DIAGNOSIS — N6320 Unspecified lump in the left breast, unspecified quadrant: Secondary | ICD-10-CM

## 2019-05-07 LAB — CYTOLOGY - PAP
Diagnosis: NEGATIVE
HPV: NOT DETECTED

## 2019-05-13 ENCOUNTER — Ambulatory Visit
Admission: RE | Admit: 2019-05-13 | Discharge: 2019-05-13 | Disposition: A | Discharge status: Home / Self Care | Payer: BC Managed Care – PPO | Source: Ambulatory Visit | Attending: Family Medicine | Admitting: Family Medicine

## 2019-05-13 ENCOUNTER — Other Ambulatory Visit: Payer: Self-pay | Admitting: Family Medicine

## 2019-05-13 ENCOUNTER — Other Ambulatory Visit: Payer: Self-pay

## 2019-05-13 DIAGNOSIS — N6320 Unspecified lump in the left breast, unspecified quadrant: Secondary | ICD-10-CM

## 2019-05-13 DIAGNOSIS — Z0000 Encounter for general adult medical examination without abnormal findings: Secondary | ICD-10-CM

## 2019-05-23 ENCOUNTER — Ambulatory Visit
Admission: RE | Admit: 2019-05-23 | Discharge: 2019-05-23 | Disposition: A | Discharge status: Home / Self Care | Payer: BC Managed Care – PPO | Source: Ambulatory Visit | Attending: Family Medicine | Admitting: Family Medicine

## 2019-05-23 DIAGNOSIS — N6320 Unspecified lump in the left breast, unspecified quadrant: Secondary | ICD-10-CM

## 2019-05-23 HISTORY — PX: BREAST BIOPSY: SHX20

## 2019-06-10 ENCOUNTER — Other Ambulatory Visit: Payer: Self-pay | Admitting: Family Medicine

## 2019-06-10 DIAGNOSIS — H6981 Other specified disorders of Eustachian tube, right ear: Secondary | ICD-10-CM

## 2019-06-10 DIAGNOSIS — J3089 Other allergic rhinitis: Secondary | ICD-10-CM

## 2019-08-30 ENCOUNTER — Other Ambulatory Visit: Payer: Self-pay | Admitting: Family Medicine

## 2019-08-30 DIAGNOSIS — Z1231 Encounter for screening mammogram for malignant neoplasm of breast: Secondary | ICD-10-CM

## 2019-09-02 ENCOUNTER — Other Ambulatory Visit: Payer: Self-pay | Admitting: Family Medicine

## 2019-09-02 DIAGNOSIS — D510 Vitamin B12 deficiency anemia due to intrinsic factor deficiency: Secondary | ICD-10-CM

## 2019-10-18 ENCOUNTER — Ambulatory Visit
Admission: RE | Admit: 2019-10-18 | Discharge: 2019-10-18 | Disposition: A | Discharge status: Home / Self Care | Payer: BC Managed Care – PPO | Source: Ambulatory Visit | Attending: Family Medicine | Admitting: Family Medicine

## 2019-10-18 ENCOUNTER — Other Ambulatory Visit: Payer: Self-pay

## 2019-10-18 DIAGNOSIS — Z1231 Encounter for screening mammogram for malignant neoplasm of breast: Secondary | ICD-10-CM

## 2019-10-31 ENCOUNTER — Ambulatory Visit (INDEPENDENT_AMBULATORY_CARE_PROVIDER_SITE_OTHER): Payer: Medicare Other | Admitting: Family Medicine

## 2019-10-31 ENCOUNTER — Encounter: Payer: Self-pay | Admitting: Family Medicine

## 2019-10-31 ENCOUNTER — Other Ambulatory Visit: Payer: Self-pay

## 2019-10-31 VITALS — Ht 64.0 in | Wt 145.0 lb

## 2019-10-31 DIAGNOSIS — R0989 Other specified symptoms and signs involving the circulatory and respiratory systems: Secondary | ICD-10-CM

## 2019-10-31 DIAGNOSIS — R7303 Prediabetes: Secondary | ICD-10-CM

## 2019-10-31 DIAGNOSIS — J3089 Other allergic rhinitis: Secondary | ICD-10-CM

## 2019-10-31 DIAGNOSIS — Z9119 Patient's noncompliance with other medical treatment and regimen: Secondary | ICD-10-CM | POA: Insufficient documentation

## 2019-10-31 DIAGNOSIS — E785 Hyperlipidemia, unspecified: Secondary | ICD-10-CM

## 2019-10-31 DIAGNOSIS — Z91199 Patient's noncompliance with other medical treatment and regimen due to unspecified reason: Secondary | ICD-10-CM | POA: Insufficient documentation

## 2019-10-31 DIAGNOSIS — Z23 Encounter for immunization: Secondary | ICD-10-CM

## 2019-10-31 DIAGNOSIS — N1831 Chronic kidney disease, stage 3a: Secondary | ICD-10-CM

## 2019-10-31 DIAGNOSIS — E559 Vitamin D deficiency, unspecified: Secondary | ICD-10-CM

## 2019-10-31 DIAGNOSIS — J989 Respiratory disorder, unspecified: Secondary | ICD-10-CM

## 2019-10-31 DIAGNOSIS — Z7189 Other specified counseling: Secondary | ICD-10-CM

## 2019-10-31 MED ORDER — PNEUMOVAX 23 25 MCG/0.5ML IJ INJ: 0.5000 mL | INJECTION | INTRAMUSCULAR | 0 refills | Status: AC

## 2019-10-31 MED ORDER — ATORVASTATIN CALCIUM 40 MG PO TABS: 40.0000 mg | ORAL_TABLET | Freq: Every day | ORAL | 1 refills | Status: DC

## 2019-10-31 NOTE — Progress Notes (Signed)
Telehealth office visit note for Brooke Hansen, D.O- at Primary Care at [facility]   I connected with current patient today and verified that I am speaking with the correct person using two identifiers.   . Location of the patient: Home . Location of the provider: Office Only the patient (+/- their family members at pt's discretion) and myself were participating in the encounter - This visit type was conducted due to national recommendations for restrictions regarding the COVID-19 Pandemic (e.g. social distancing) in an effort to limit this patient's exposure and mitigate transmission in our community.  This format is felt to be most appropriate for this patient at this time.   - The patient did not have access to video technology or had technical difficulties with video requiring transitioning to audio format only. - No physical exam could be performed with this format, beyond that communicated to [place] by the patient/ family members as noted.   - Additionally my office staff/ schedulers discussed with the patient that there may be a monetary charge related to this service, depending on their medical insurance.   The patient expressed understanding, and agreed to proceed.       History of Present Illness:   I, [name], am serving as scribe for Dr. Mellody Hansen.   Notes she is doing okay today.   - Lifestyle & Exercise To exercise, she engages in yoga, strength training, aerobics, pickle ball, golf, and walking.  Thinks she gets about an hour of cardio per day.  Notes she tries to be conscious of her diet/eating habits, especially regarding cholesterol.     - Prediabetes She tries to limit her sugar and sweets intake, but "that's my downfall, of course."  Says she decided during [place] "this could go one of two ways; I could watch what I'm eating, or really indulge."  Notes she tries not to indulge.  Her daughter-in-law went back to work and she's taking care  of her grandsons right now, and has "lots of little snack-y things in the house."  Denies symptoms such as dry mouth, excessive thirst, excessive urination.   - Kidney Health She tries to avoid use of NSAID's, but time to time she does get a sinus headache and uses Tylenol sinus.  Notes "I don't take anything like that daily."  She does not take meloxicam daily.  Notes if she has a flare-up in her knee, she will take one.   - RAD, Seasonal & Environmental Allergies She continues Flonase daily, and uses inhaler more often in the winter.  Notes "when the heat comes on, I start to feel a little more tightness in my chest."  Says if she uses it for three days, "that takes care of it and I don't need it anymore."  States that staying hydrated helps a lot, and it's "much better than it was before."   - Knot on Arch of Foot Has a "knot" that has appeared on the arch of her left foot.  Notes it doesn't bother her.  States it does not inhibit her in her daily activities / daily exercise.   HPI:  Hyperlipidemia: 63 y.o. female here for cholesterol follow-up.   - Patient reports good compliance with treatment plan of:  medication and/ or lifestyle management.    - Patient denies any acute concerns or problems with management plan   - She denies new onset of: myalgias, arthralgias, increased fatigue more than normal, chest pains,  exercise intolerance, shortness of breath, dizziness, visual changes, headache, lower extremity swelling or claudication.   Most recent cholesterol panel was:  Lab Results  Component Value Date   CHOL 193 05/02/2019   HDL 74 05/02/2019   LDLCALC 108 (H) 05/02/2019   LDLDIRECT 119.2 01/15/2007   TRIG 53 05/02/2019   CHOLHDL 2.6 05/02/2019   Hepatic Function Latest Ref Rng & Units 05/02/2019 04/30/2018 03/13/2017  Total Protein 6.0 - 8.5 g/dL 6.8 6.8 6.6  Albumin 3.8 - 4.8 g/dL 4.5 4.3 4.1  AST 0 - 40 IU/L '22 21 18  '$ ALT 0 - 32 IU/L '15 16 14  '$ Alk Phosphatase 39 -  117 IU/L 68 85 67  Total Bilirubin 0.0 - 1.2 mg/dL 0.4 <0.2 0.4  Bilirubin, Direct 0.0 - 0.3 mg/dL - - 0.1     GAD 7 : Generalized Anxiety Score 05/02/2019  Nervous, Anxious, on Edge 0  Control/stop worrying 0  Worry too much - different things 0  Trouble relaxing 0  Restless 0  Easily annoyed or irritable 0  Afraid - awful might happen 0  Total GAD 7 Score 0    Depression screen [facility] 2/9 10/31/2019 05/02/2019 05/07/2018 04/30/2018 03/06/2018  Decreased Interest 0 0 0 0 0  Down, Depressed, Hopeless 0 0 0 0 0  PHQ - 2 Score 0 0 0 0 0  Altered sleeping 0 0 0 0 0  Tired, decreased energy 0 0 0 0 0  Change in appetite 0 0 0 0 0  Feeling bad or failure about yourself  0 0 0 0 0  Trouble concentrating 0 0 0 0 0  Moving slowly or fidgety/restless 0 0 0 0 0  Suicidal thoughts 0 0 0 0 0  PHQ-9 Score 0 0 0 0 0  Difficult doing work/chores - Not difficult at all Not difficult at all Not difficult at all Not difficult at all      Impression and Recommendations:    1. Stage 3a chronic kidney disease   2. Hyperlipidemia, unspecified hyperlipidemia type   3. Prediabetes   4. Vitamin D insufficiency   5. Environmental and seasonal allergies   6. Reactive airway disease that is not asthma-due to environmental allergens   7. Need for pneumococcal vaccination   8. Counseling on health promotion and disease prevention      - Last visit for physical in June 2019.   Stage 3a Chronic Kidney Disease - Encouraged patient to continue to avoid use of NSAID's and nephrotoxic substances. - Encouraged patient to use meloxicam rarely as established. - Advised patient to continue to hydrate and engage in regular exercise. - Will continue to monitor and re-check as recommended.   Environmental & Seasonal Allergies, RAD - Per patient, stable and "much better than it used to be." - Continue Flonase and prudent use of inhaler.  See med list. - Patient knows to call in if she wishes to begin  Singulair, alternatives, or additional assistance. - Will continue to monitor.   Prediabetes - Last checked in 2019, 6.0. - Will continue to monitor. - Patient knows to call in if she ever feels symptomatic, such as excessive thirst or urination. - Need for re-check as discussed.   Vitamin D Insufficiency - Stable at this time; 68.2 last check. - Discussed maintaining goal range of 40-60. - Continue supplementation as established.  See med list. - Will continue to monitor and re-check as discussed.   Hyperlipidemia Triglycerides = 53, down from 80 prior HDL =  74, up from 65 prior. LDL = 108, elevated, but stable from 108 prior.  The 10-year ASCVD risk score [name] DC Brooke Bonito., et al., 2013) is: 4%   Values used to calculate the score:     Age: 63 years     Sex: Female     Is Non-Hispanic African American: No     Diabetic: No     Tobacco smoker: No     Systolic Blood Pressure: 790 mmHg     Is BP treated: No     HDL Cholesterol: 74 mg/dL     Total Cholesterol: 193 mg/dL  - Cholesterol levels remain stable, but LDL still above 100.  - Discussed that as patient is tolerating statin well without S-E, we have the option to increase dosage to help drive LDL value lower.  Conversation held regarding adjusting prescription / dosage.  - Patient agrees to begin 40 mg Lipitor.  See med list.  - Ongoing prudent dietary changes such as low saturated & trans fat diets for hyperlipidemia and low carb diets for hypertriglyceridemia discussed extensively with patient.    - Encouraged patient to follow AHA guidelines for regular exercise and maintain a BMI below 25.  - We will continue to monitor and re-check as discussed.   Reported Knot on Left Arch of Foot - Per patient, stable and asymptomatic at this time. - If symptoms worsen or interfere with quality of life, advised referral to [place]. - Will continue to monitor.   Lifestyle & Preventative Health Maintenance - Advised  patient to continue working toward exercising to improve overall mental, physical, and emotional health.    - Reviewed the "spokes of the wheel" of mood and health management.  Stressed the importance of ongoing prudent habits, including regular exercise, appropriate sleep hygiene, healthful dietary habits, and prayer/meditation to relax.  - Encouraged patient to continue to engage in daily physical activity, especially a formal exercise routine.  Recommended that the patient eventually strive for at least 150 minutes of moderate cardiovascular activity per week according to guidelines established by the [facility].   - Healthy dietary habits encouraged, including low-carb, and high amounts of lean protein in diet.   - Patient should also consume adequate amounts of water.  - Health counseling performed.  All questions answered.   Recommendations - Return in 3 months for FLP, A1c, CMP. - Return for Medicare Wellness in 6 months.   - As part of my medical decision making, I reviewed the following data within the [place] History obtained from pt /family, CMA notes reviewed and incorporated if applicable, Labs reviewed, Radiograph/ tests reviewed if applicable and OV notes from prior OV's with me, as well as other specialists she/he has seen since seeing me last, were all reviewed and used in my medical decision making process today.    - Additionally, discussion had with patient regarding our treatment plan, and their biases/concerns about that plan were used in my medical decision making today.    - The patient agreed with the plan and demonstrated an understanding of the instructions.   No barriers to understanding were identified.    - Red flag symptoms and signs discussed in detail.  Patient expressed understanding regarding what to do in case of emergency\ urgent symptoms.   - The patient was advised to call back or seek an in-person evaluation if the symptoms worsen or if  the condition fails to improve as anticipated.   Return for started statin-FLP, A1c,  CMP in 3 months; medicare wellness 62mo    Orders Placed This Encounter  Procedures  . Lipid panel  . Comprehensive metabolic panel  . Hemoglobin A1c    Meds ordered this encounter  Medications  . pneumococcal 23 valent vaccine (PNEUMOVAX 23) 25 MCG/0.5ML injection    Sig: Inject 0.5 mLs into the muscle tomorrow at 10 am for 1 dose.    Dispense:  0.5 mL    Refill:  0  . atorvastatin (LIPITOR) 40 MG tablet    Sig: Take 1 tablet (40 mg total) by mouth at bedtime.    Dispense:  90 tablet    Refill:  1    Medications Discontinued During This Encounter  Medication Reason  . simvastatin (ZOCOR) 40 MG tablet      I provided 27+ minutes of non face-to-face time during this encounter.  Additional time was spent with charting and coordination of care before and after the actual visit commenced.   Note:  This note was prepared with assistance of Dragon voice recognition software. Occasional wrong-word or sound-a-like substitutions may have occurred due to the inherent limitations of voice recognition software.  This document serves as a record of services personally performed by DMellody Dance DO. It was created on her behalf by KToni Amend a trained medical scribe. The creation of this record is based on the scribe's personal observations and the provider's statements to them.   This case required medical decision making of at least moderate complexity. The above documentation has been reviewed to be accurate and was completed by DMarjory Sneddon D.O.       Patient Care Team    Relationship Specialty Notifications Start End  OMellody Dance DO PCP - General Family Medicine  03/06/18   MLuberta Mutter MD Consulting Physician Ophthalmology  03/06/18      -Vitals obtained; medications/ allergies reconciled;  personal medical, social, Sx etc.histories were updated by CMA, reviewed  by me and are reflected in chart   Patient Active Problem List   Diagnosis Date Noted  . Prediabetes 05/07/2018  . Hyperlipidemia 09/11/2007  . Stage III chronic kidney disease 05/07/2018  . Vitamin D insufficiency 05/07/2018  . Reactive airway disease that is not asthma-due to environmental allergens 03/06/2018  . Personal history of noncompliance with medical treatment and regimen 10/31/2019  . ETD (Eustachian tube dysfunction), right 04/30/2018  . Chronic vertigo-  seen by ENT in past 04/30/2018  . Primary osteoarthritis of right knee 04/30/2018  . counseling for NSAID long-term use-  Mobic given to pt by Ortho for R knee OA 04/30/2018  . Renal agenesis, unspecified-in 1970s patient had a "shriveled up disease kidney "; status post right nephrectomy  04/30/2018  . Environmental and seasonal allergies 03/06/2018  . Chronic pain of right knee with instability 03/06/2018  . Status post lateral meniscectomy of right knee 03/06/2018  . Other instability, right knee 03/06/2018  . Screening for cardiovascular condition 03/20/2017  . Routine general medical examination at a health care facility 02/22/2016  . Right-sided chest wall pain 10/05/2015  . Pernicious anemia 10/20/2014  . Dysplastic nevus of trunk 04/25/2013  . Vaginitis, atrophic 04/07/2011  . Allergic rhinitis 09/11/2007     Current Meds  Medication Sig  . albuterol (PROAIR HFA) 108 (90 Base) MCG/ACT inhaler Inhale 2 puffs into the lungs every 6 (six) hours as needed for wheezing or shortness of breath.  . Calcium Carbonate-Vitamin D (CALCIUM + D PO) Take by mouth daily.  . cyanocobalamin (,VITAMIN  B-12,) 1000 MCG/ML injection INJECT 1 ML INTO THE MUSCLE EVERY 30 DAYS.  . fluticasone (FLONASE) 50 MCG/ACT nasal spray PLACE 2 SPRAYS IN EACH NOSTRIL DAILY.  Marland Kitchen Vitamin D, Ergocalciferol, (DRISDOL) 1.25 MG (50000 UT) CAPS capsule TAKE 1 CAPSULE BY MOUTH EVERY 7 DAYS.  . [DISCONTINUED] simvastatin (ZOCOR) 40 MG tablet TAKE 1  TABLET BY MOUTH AT BEDTIME.     Allergies:  Allergies  Allergen Reactions  . Septra [Sulfamethoxazole-Trimethoprim] Other (See Comments)    Per pt: unknown  . Ampicillin Rash     ROS:  See above HPI for pertinent positives and negatives   Objective:   Height '5\' 4"'$  (1.626 m), weight 145 lb (65.8 kg).  (if some vitals are omitted, this means that patient was UNABLE to obtain them even though they were asked to get them prior to OV today.  They were asked to call us at their earliest convenience with these once obtained. )  General: A & O * 3; sounds in no acute distress; in usual state of health.  Skin: Pt confirms warm and dry extremities and pink fingertips HEENT: Pt confirms lips non-cyanotic Chest: Patient confirms normal chest excursion and movement Respiratory: speaking in full sentences, no conversational dyspnea; patient confirms no use of accessory muscles Psych: insight appears good, mood- appears full

## 2019-11-30 ENCOUNTER — Other Ambulatory Visit: Payer: Self-pay | Admitting: Family Medicine

## 2019-11-30 DIAGNOSIS — D510 Vitamin B12 deficiency anemia due to intrinsic factor deficiency: Secondary | ICD-10-CM

## 2019-12-05 ENCOUNTER — Ambulatory Visit: Payer: Self-pay | Attending: Internal Medicine

## 2019-12-05 DIAGNOSIS — Z23 Encounter for immunization: Secondary | ICD-10-CM | POA: Insufficient documentation

## 2019-12-05 NOTE — Progress Notes (Signed)
   Covid-19 Vaccination Clinic  Name:  Brooke Hansen    MRN: [id number] DOB: 02/23/1956  12/05/2019  [name] was observed post Covid-19 immunization for 15 minutes without incidence. She was provided with Vaccine Information Sheet and instruction to access the V-Safe system.   Ms. Soles was instructed to call 911 with any severe reactions post vaccine: Marland Kitchen Difficulty breathing  . Swelling of your face and throat  . A fast heartbeat  . A bad rash all over your body  . Dizziness and weakness    Immunizations Administered    Name Date Dose VIS Date Route   Pfizer COVID-19 Vaccine 12/05/2019  1:51 PM 0.3 mL 10/25/2019 Intramuscular   Manufacturer: [place]   Lot: [id number]   [place]: [id number]

## 2019-12-23 ENCOUNTER — Encounter: Payer: Self-pay | Admitting: Emergency Medicine

## 2019-12-23 ENCOUNTER — Ambulatory Visit
Admission: EM | Admit: 2019-12-23 | Discharge: 2019-12-23 | Disposition: A | Discharge status: Home / Self Care | Payer: Medicare PPO | Attending: Emergency Medicine | Admitting: Emergency Medicine

## 2019-12-23 ENCOUNTER — Telehealth: Payer: Self-pay | Admitting: Family Medicine

## 2019-12-23 ENCOUNTER — Other Ambulatory Visit: Payer: Self-pay

## 2019-12-23 DIAGNOSIS — R42 Dizziness and giddiness: Secondary | ICD-10-CM | POA: Diagnosis not present

## 2019-12-23 DIAGNOSIS — H9311 Tinnitus, right ear: Secondary | ICD-10-CM | POA: Diagnosis not present

## 2019-12-23 DIAGNOSIS — J3489 Other specified disorders of nose and nasal sinuses: Secondary | ICD-10-CM | POA: Diagnosis not present

## 2019-12-23 DIAGNOSIS — Z20822 Contact with and (suspected) exposure to covid-19: Secondary | ICD-10-CM

## 2019-12-23 MED ORDER — PREDNISONE 20 MG PO TABS: 40.0000 mg | ORAL_TABLET | Freq: Every day | ORAL | 0 refills | Status: AC

## 2019-12-23 MED ORDER — MECLIZINE HCL 25 MG PO TABS: 25.0000 mg | ORAL_TABLET | Freq: Three times a day (TID) | ORAL | 0 refills | Status: DC | PRN

## 2019-12-23 NOTE — Telephone Encounter (Signed)
Spoke with patient who complains of Tinnitus in R ear along with dizziness, nausea, vomiting and diarrhea. Advised patient that Dr. Raliegh Scarlet was not in the office today but since she was having these acute symptoms that I suggest her be seen at urgent care for assessment and possible COVID testing. Patient verbalized understanding and set up apt for UC on [place]. AS, CMA

## 2019-12-23 NOTE — Telephone Encounter (Signed)
Patient called states since Thursday has some severe dizziness  Due to Ringing in Rt Ear, also nausea / vomiting & diarrhea.  --Forwarding  Telephone Brooke Hansen to med asst to triage patient.  --glh

## 2019-12-23 NOTE — Discharge Instructions (Addendum)
Your COVID test is pending - it is important to quarantine / isolate at home until your results are back. °If you test positive and would like further evaluation for persistent or worsening symptoms, you may schedule an E-visit or virtual (video) visit throughout the [facility] MyChart app or website. ° °PLEASE NOTE: If you develop severe chest pain or shortness of breath please go to the ER or call 9-1-1 for further evaluation --> DO NOT schedule electronic or virtual visits for this. °Please call our office for further guidance / recommendations as needed. ° °For information about the Covid vaccine, please visit [email or web address] °

## 2019-12-23 NOTE — ED Triage Notes (Signed)
Pt presents to [facility] for assessment of sinus pressure and right ear pressure x 4 days.  Patient states Friday when she tries to get up she was very dizzy and had to lay in bed for about an hour.  Has not had another episode until last night of dizziness which caused her to throw up, also had diarrhea.  Patient states she took Tums multiple times yesterday, and since the episode has not had much of an appetite.

## 2019-12-23 NOTE — ED Provider Notes (Signed)
[facility]    CSN: [id number] Arrival date & time: 12/23/19  1052      History   Chief Complaint Chief Complaint  Patient presents with  . Nasal Congestion  . Otalgia    HPI Brooke Hansen is a 64 y.o. female with history of allergies, asthma eustachian tube dysfunction of right ear, and vertigo presenting for 4-day course of right ear pressure.  Patient states that for the last few days she has been very dizzy upon standing with some nausea and single episode of emesis without bile or blood that occurred last night.  Patient denies chest pain, shortness of breath, abdominal pain during these episodes.  Patient does have ringing in her right ear that is chronic, though has been worse for the last few days.  States hearing is intact otherwise.  Patient has been evaluated in the past by Dr. Lucia Gaskins (ENT) for similar symptoms: States she was told she had a "virus in her ear ".  No recent change in medications, lifestyle/diet.  Last flare of this was in 2016 at which point she saw ENT.  Patient states symptoms are mostly resolved in office, still having mild ringing and "feeling off ".  Denies history of stroke, TIA.  No unilateral weakness, numbness, facial droop or difficulty speaking.   Past Medical History:  Diagnosis Date  . Allergy   . Anemia   . Asthma   . History of nephrectomy    right - nonfunctioning kidney  . Hyperlipidemia   . RLS (restless legs syndrome)   . Transfusion of blood during current hospitalization     Patient Active Problem List   Diagnosis Date Noted  . Personal history of noncompliance with medical treatment and regimen 10/31/2019  . Prediabetes 05/07/2018  . Stage III chronic kidney disease 05/07/2018  . Vitamin D insufficiency 05/07/2018  . ETD (Eustachian tube dysfunction), right 04/30/2018  . Chronic vertigo-  seen by ENT in past 04/30/2018  . Primary osteoarthritis of right knee 04/30/2018  . counseling for NSAID long-term use-   Mobic given to pt by Ortho for R knee OA 04/30/2018  . Renal agenesis, unspecified-in 1970s patient had a "shriveled up disease kidney "; status post right nephrectomy  04/30/2018  . Reactive airway disease that is not asthma-due to environmental allergens 03/06/2018  . Environmental and seasonal allergies 03/06/2018  . Chronic pain of right knee with instability 03/06/2018  . Status post lateral meniscectomy of right knee 03/06/2018  . Other instability, right knee 03/06/2018  . Screening for cardiovascular condition 03/20/2017  . Routine general medical examination at a health care facility 02/22/2016  . Right-sided chest wall pain 10/05/2015  . Pernicious anemia 10/20/2014  . Dysplastic nevus of trunk 04/25/2013  . Vaginitis, atrophic 04/07/2011  . Hyperlipidemia 09/11/2007  . Allergic rhinitis 09/11/2007    Past Surgical History:  Procedure Laterality Date  . CESAREAN SECTION  1986  . NEPHRECTOMY Right 1975    OB History    Gravida  1   Para      Term      Preterm      AB      Living        SAB      TAB      Ectopic      Multiple      Live Births               Home Medications    Prior to Admission medications  Medication Sig Start Date End Date Taking? Authorizing Provider  albuterol (PROAIR HFA) 108 (90 Base) MCG/ACT inhaler Inhale 2 puffs into the lungs every 6 (six) hours as needed for wheezing or shortness of breath. 02/06/19   Opalski, Neoma Laming, DO  atorvastatin (LIPITOR) 40 MG tablet Take 1 tablet (40 mg total) by mouth at bedtime. 10/31/19   Opalski, Neoma Laming, DO  Calcium Carbonate-Vitamin D (CALCIUM + D PO) Take by mouth daily.    [provider]  cyanocobalamin (,VITAMIN B-12,) 1000 MCG/ML injection INJECT 1 ML INTO THE MUSCLE EVERY 30 DAYS. 12/02/19   Opalski, Deborah, DO  fluticasone (FLONASE) 50 MCG/ACT nasal spray PLACE 2 SPRAYS IN EACH NOSTRIL DAILY. 06/10/19   Opalski, Neoma Laming, DO  meclizine (ANTIVERT) 25 MG tablet Take 1 tablet  (25 mg total) by mouth 3 (three) times daily as needed for dizziness. 12/23/19   Hall-Potvin, [place], PA-C  meloxicam (MOBIC) 15 MG tablet Take 1 tablet by mouth daily. 04/13/18   [provider]  predniSONE (DELTASONE) 20 MG tablet Take 2 tablets (40 mg total) by mouth daily for 3 days. 12/23/19 12/26/19  Hall-Potvin, [place], PA-C  Vitamin D, Ergocalciferol, (DRISDOL) 1.25 MG (50000 UNIT) CAPS capsule TAKE 1 CAPSULE BY MOUTH EVERY 7 DAYS. 12/02/19   Mellody Dance, DO    Family History Family History  Problem Relation Age of Onset  . Alcohol abuse Other   . Diabetes Other   . Hyperlipidemia Other   . Coronary artery disease Other   . Hyperlipidemia Mother   . Cancer Father        lung  . Heart disease Father   . Heart disease Maternal Grandfather   . Colon cancer Neg Hx     Social History Social History   Tobacco Use  . Smoking status: Former Smoker    Packs/day: 1.00    Years: 10.00    Pack years: 10.00    Quit date: 11/15/1983    Years since quitting: 36.1  . Smokeless tobacco: Never Used  Substance Use Topics  . Alcohol use: Yes    Alcohol/week: 2.0 standard drinks    Types: 2 Standard drinks or equivalent per week  . Drug use: No     Allergies   Septra [sulfamethoxazole-trimethoprim] and Ampicillin   Review of Systems As per HPI   Physical Exam Triage Vital Signs ED Triage Vitals  Enc Vitals Group     BP 12/23/19 1101 (!) 156/101     Pulse Rate 12/23/19 1101 88     Resp 12/23/19 1101 18     Temp 12/23/19 1101 98.2 F (36.8 C)     Temp Source 12/23/19 1101 Temporal     SpO2 12/23/19 1101 98 %     Weight --      Height --      Head Circumference --      Peak Flow --      Pain Score 12/23/19 1102 0     Pain Loc --      Pain Edu? --      Excl. in [place]? --    No data found.  Updated Vital Signs BP (!) 143/84 Comment: taken by APP during assessment  Pulse 88   Temp 98.2 F (36.8 C) (Temporal)   Resp 18   SpO2 98%   Visual Acuity Right  Eye Distance:   Left Eye Distance:   Bilateral Distance:    Right Eye Near:   Left Eye Near:    Bilateral Near:  Physical Exam Constitutional:      General: She is not in acute distress.    Appearance: She is not ill-appearing or diaphoretic.  HENT:     Head: Normocephalic and atraumatic.     Right Ear: Tympanic membrane, ear canal and external ear normal.     Left Ear: Tympanic membrane, ear canal and external ear normal.     Ears:     Comments: Negative tragal tenderness bilaterally.  Hearing grossly intact bilaterally    Mouth/Throat:     Mouth: Mucous membranes are moist.     Pharynx: Oropharynx is clear.  Eyes:     General: No scleral icterus.    Extraocular Movements: Extraocular movements intact.     Conjunctiva/sclera: Conjunctivae normal.     Pupils: Pupils are equal, round, and reactive to light.  Neck:     Vascular: No carotid bruit.  Cardiovascular:     Rate and Rhythm: Normal rate and regular rhythm.     Heart sounds: No murmur. No gallop.   Pulmonary:     Effort: Pulmonary effort is normal. No respiratory distress.     Breath sounds: No wheezing.  Musculoskeletal:        General: No deformity. Normal range of motion.     Cervical back: Normal range of motion. No rigidity or tenderness.  Lymphadenopathy:     Cervical: No cervical adenopathy.  Skin:    General: Skin is warm.     Capillary Refill: Capillary refill takes less than 2 seconds.     Coloration: Skin is not jaundiced.     Findings: No bruising or rash.  Neurological:     General: No focal deficit present.     Mental Status: She is alert and oriented to person, place, and time.     Cranial Nerves: Cranial nerves are intact.     Sensory: Sensation is intact.     Motor: Motor function is intact.     Coordination: Coordination is intact.     Gait: Gait is intact.     Comments: Symptoms not exacerbated during exam  Psychiatric:        Mood and Affect: Mood normal.        Behavior: Behavior  normal.      UC Treatments / Results  Labs (all labs ordered are listed, but only abnormal results are displayed) Labs Reviewed  NOVEL CORONAVIRUS, NAA    EKG   Radiology No results found.  Procedures Procedures (including critical care time)  Medications Ordered in UC Medications - No data to display  Initial Impression / Assessment and Plan / UC Course  I have reviewed the triage vital signs and the nursing notes.  Pertinent labs & imaging results that were available during my care of the patient were reviewed by me and considered in my medical decision making (see chart for details).     Patient afebrile, nontoxic, and without neurocognitive deficit in office today.  Covid PCR test pending per patient's request: States her PCP wants this done.  Low concern for Covid at this time; clinically appears to be Mnire's disease.  Will manage symptoms with Antivert, trial prednisone, and have patient follow-up with her ENT provider.  Return precautions discussed, patient verbalized understanding and is agreeable to plan. Final Clinical Impressions(s) / UC Diagnoses   Final diagnoses:  Vertigo  Tinnitus of right ear  Sinus pressure     Discharge Instructions     Your COVID test is pending - it is important  to quarantine / isolate at home until your results are back. If you test positive and would like further evaluation for persistent or worsening symptoms, you may schedule an E-visit or virtual (video) visit throughout the [facility] app or website.  PLEASE NOTE: If you develop severe chest pain or shortness of breath please go to the ER or call [phone] for further evaluation --> DO NOT schedule electronic or virtual visits for this. Please call our office for further guidance / recommendations as needed.  For information about the Covid vaccine, please visit [email or web address]    ED Prescriptions    Medication Sig Dispense Auth. Provider   predniSONE  (DELTASONE) 20 MG tablet Take 2 tablets (40 mg total) by mouth daily for 3 days. 6 tablet Hall-Potvin, [place], PA-C   meclizine (ANTIVERT) 25 MG tablet Take 1 tablet (25 mg total) by mouth 3 (three) times daily as needed for dizziness. 30 tablet Hall-Potvin, [place], PA-C     PDMP not reviewed this encounter.   Hall-Potvin, [place], [place] 12/24/19 [phone]

## 2019-12-24 ENCOUNTER — Encounter: Payer: Self-pay | Admitting: Emergency Medicine

## 2019-12-24 LAB — NOVEL CORONAVIRUS, NAA: SARS-CoV-2, NAA: NOT DETECTED

## 2019-12-26 ENCOUNTER — Ambulatory Visit: Payer: Self-pay

## 2019-12-26 ENCOUNTER — Ambulatory Visit: Payer: Medicare PPO | Attending: Internal Medicine

## 2019-12-26 DIAGNOSIS — Z23 Encounter for immunization: Secondary | ICD-10-CM | POA: Insufficient documentation

## 2019-12-26 NOTE — Progress Notes (Signed)
   Covid-19 Vaccination Clinic  Name:  Brooke Hansen    MRN: [id number] DOB: 06/05/1956  12/26/2019  Ms. Blades was observed post Covid-19 immunization for 15 minutes without incidence. She was provided with Vaccine Information Sheet and instruction to access the V-Safe system.   [name] was instructed to call 911 with any severe reactions post vaccine: Marland Kitchen Difficulty breathing  . Swelling of your face and throat  . A fast heartbeat  . A bad rash all over your body  . Dizziness and weakness    Immunizations Administered    Name Date Dose VIS Date Route   Pfizer COVID-19 Vaccine 12/26/2019  9:54 AM 0.3 mL 10/25/2019 Intramuscular   Manufacturer: [place]   Lot: [id number]   [place]: [id number]

## 2020-01-09 ENCOUNTER — Ambulatory Visit: Payer: Medicare Other

## 2020-01-31 ENCOUNTER — Other Ambulatory Visit: Payer: Medicare PPO

## 2020-02-05 ENCOUNTER — Other Ambulatory Visit: Payer: Self-pay

## 2020-02-05 ENCOUNTER — Other Ambulatory Visit: Payer: Medicare PPO

## 2020-02-05 DIAGNOSIS — E785 Hyperlipidemia, unspecified: Secondary | ICD-10-CM

## 2020-02-05 DIAGNOSIS — R7303 Prediabetes: Secondary | ICD-10-CM

## 2020-02-05 DIAGNOSIS — N1831 Chronic kidney disease, stage 3a: Secondary | ICD-10-CM

## 2020-02-06 LAB — COMPREHENSIVE METABOLIC PANEL
ALT: 14 IU/L (ref 0–32)
AST: 21 IU/L (ref 0–40)
Albumin/Globulin Ratio: 1.8 (ref 1.2–2.2)
Albumin: 4.4 g/dL (ref 3.8–4.8)
Alkaline Phosphatase: 80 IU/L (ref 39–117)
BUN/Creatinine Ratio: 18 (ref 12–28)
BUN: 19 mg/dL (ref 8–27)
Bilirubin Total: 0.4 mg/dL (ref 0.0–1.2)
CO2: 28 mmol/L (ref 20–29)
Calcium: 10.3 mg/dL (ref 8.7–10.3)
Chloride: 104 mmol/L (ref 96–106)
Creatinine, Ser: 1.03 mg/dL — ABNORMAL HIGH (ref 0.57–1.00)
GFR calc Af Amer: 66 mL/min/{1.73_m2} (ref >59)
GFR calc non Af Amer: 57 mL/min/{1.73_m2} — ABNORMAL LOW (ref >59)
Globulin, Total: 2.4 g/dL (ref 1.5–4.5)
Glucose: 87 mg/dL (ref 65–99)
Potassium: 4.7 mmol/L (ref 3.5–5.2)
Sodium: 145 mmol/L — ABNORMAL HIGH (ref 134–144)
Total Protein: 6.8 g/dL (ref 6.0–8.5)

## 2020-02-06 LAB — LIPID PANEL
Chol/HDL Ratio: 2.7 ratio (ref 0.0–4.4)
Cholesterol, Total: 170 mg/dL (ref 100–199)
HDL: 63 mg/dL (ref >39)
LDL Chol Calc (NIH): 92 mg/dL (ref 0–99)
Triglycerides: 81 mg/dL (ref 0–149)
VLDL Cholesterol Cal: 15 mg/dL (ref 5–40)

## 2020-02-06 LAB — HEMOGLOBIN A1C
Est. average glucose Bld gHb Est-mCnc: 128 mg/dL
Hgb A1c MFr Bld: 6.1 % — ABNORMAL HIGH (ref 4.8–5.6)

## 2020-02-17 ENCOUNTER — Other Ambulatory Visit: Payer: Self-pay | Admitting: Family Medicine

## 2020-02-17 DIAGNOSIS — D510 Vitamin B12 deficiency anemia due to intrinsic factor deficiency: Secondary | ICD-10-CM

## 2020-02-18 NOTE — Telephone Encounter (Signed)
Please call pt to schedule appt.  No further refills until pt is seen.  T. Hennessey Cantrell, CMA  

## 2020-05-04 ENCOUNTER — Ambulatory Visit (INDEPENDENT_AMBULATORY_CARE_PROVIDER_SITE_OTHER): Payer: Medicare PPO | Admitting: Physician Assistant

## 2020-05-04 ENCOUNTER — Other Ambulatory Visit: Payer: Self-pay

## 2020-05-04 VITALS — BP 125/77 | HR 62 | Temp 98.2°F | Ht 63.5 in | Wt 139.2 lb

## 2020-05-04 DIAGNOSIS — E785 Hyperlipidemia, unspecified: Secondary | ICD-10-CM | POA: Diagnosis not present

## 2020-05-04 DIAGNOSIS — N1831 Chronic kidney disease, stage 3a: Secondary | ICD-10-CM

## 2020-05-04 DIAGNOSIS — R7303 Prediabetes: Secondary | ICD-10-CM | POA: Diagnosis not present

## 2020-05-04 DIAGNOSIS — Z0000 Encounter for general adult medical examination without abnormal findings: Secondary | ICD-10-CM | POA: Diagnosis not present

## 2020-05-04 DIAGNOSIS — M1711 Unilateral primary osteoarthritis, right knee: Secondary | ICD-10-CM

## 2020-05-04 DIAGNOSIS — M722 Plantar fascial fibromatosis: Secondary | ICD-10-CM

## 2020-05-04 MED ORDER — MELOXICAM 15 MG PO TABS: 15.0000 mg | ORAL_TABLET | Freq: Every day | ORAL | 0 refills | Status: DC

## 2020-05-04 NOTE — Patient Instructions (Signed)
Preventing Type 2 Diabetes Mellitus Type 2 diabetes (type 2 diabetes mellitus) is a long-term (chronic) disease that affects blood sugar (glucose) levels. Normally, a hormone called insulin allows glucose to enter cells in the body. The cells use glucose for energy. In type 2 diabetes, one or both of these problems may be present:  The body does not make enough insulin.  The body does not respond properly to insulin that it makes (insulin resistance). Insulin resistance or lack of insulin causes excess glucose to build up in the blood instead of going into cells. As a result, high blood glucose (hyperglycemia) develops, which can cause many complications. Being overweight or obese and having an inactive (sedentary) lifestyle can increase your risk for diabetes. Type 2 diabetes can be delayed or prevented by making certain nutrition and lifestyle changes. What nutrition changes can be made?   Eat healthy meals and snacks regularly. Keep a healthy snack with you for when you get hungry between meals, such as fruit or a handful of nuts.  Eat lean meats and proteins that are low in saturated fats, such as chicken, fish, egg whites, and beans. Avoid processed meats.  Eat plenty of fruits and vegetables and plenty of grains that have not been processed (whole grains). It is recommended that you eat: ? 1?2 cups of fruit every day. ? 2?3 cups of vegetables every day. ? 6?8 oz of whole grains every day, such as oats, whole wheat, bulgur, brown rice, quinoa, and millet.  Eat low-fat dairy products, such as milk, yogurt, and cheese.  Eat foods that contain healthy fats, such as nuts, avocado, olive oil, and canola oil.  Drink water throughout the day. Avoid drinks that contain added sugar, such as soda or sweet tea.  Follow instructions from your health care provider about specific eating or drinking restrictions.  Control how much food you eat at a time (portion size). ? Check food labels to find  out the serving sizes of foods. ? Use a kitchen scale to weigh amounts of foods.  Saute or steam food instead of frying it. Cook with water or broth instead of oils or butter.  Limit your intake of: ? Salt (sodium). Have no more than 1 tsp (2,400 mg) of sodium a day. If you have heart disease or high blood pressure, have less than ? tsp (1,500 mg) of sodium a day. ? Saturated fat. This is fat that is solid at room temperature, such as butter or fat on meat. What lifestyle changes can be made? Activity   Do moderate-intensity physical activity for at least 30 minutes on at least 5 days of the week, or as much as told by your health care provider.  Ask your health care provider what activities are safe for you. A mix of physical activities may be best, such as walking, swimming, cycling, and strength training.  Try to add physical activity into your day. For example: ? Park in spots that are farther away than usual, so that you walk more. For example, park in a far corner of the parking lot when you go to the office or the grocery store. ? Take a walk during your lunch break. ? Use stairs instead of elevators or escalators. Weight Loss  Lose weight as directed. Your health care provider can determine how much weight loss is best for you and can help you lose weight safely.  If you are overweight or obese, you may be instructed to lose at least 5?7 %   of your body weight. Alcohol and Tobacco   Limit alcohol intake to no more than 1 drink a day for nonpregnant women and 2 drinks a day for men. One drink equals 12 oz of beer, 5 oz of wine, or 1 oz of hard liquor.  Do not use any tobacco products, such as cigarettes, chewing tobacco, and e-cigarettes. If you need help quitting, ask your health care provider. [employer] Provider  Have your blood glucose tested regularly, as told by your health care provider.  Discuss your risk factors and how you can reduce your risk for  diabetes.  Get screening tests as told by your health care provider. You may have screening tests regularly, especially if you have certain risk factors for type 2 diabetes.  Make an appointment with a diet and nutrition specialist (registered dietitian). A registered dietitian can help you make a healthy eating plan and can help you understand portion sizes and food labels. Why are these changes important?  It is possible to prevent or delay type 2 diabetes and related health problems by making lifestyle and nutrition changes.  It can be difficult to recognize signs of type 2 diabetes. The best way to avoid possible damage to your body is to take actions to prevent the disease before you develop symptoms. What can happen if changes are not made?  Your blood glucose levels may keep increasing. Having high blood glucose for a long time is dangerous. Too much glucose in your blood can damage your blood vessels, heart, kidneys, nerves, and eyes.  You may develop prediabetes or type 2 diabetes. Type 2 diabetes can lead to many chronic health problems and complications, such as: ? Heart disease. ? Stroke. ? Blindness. ? Kidney disease. ? Depression. ? Poor circulation in the feet and legs, which could lead to surgical removal (amputation) in severe cases. Where to find support  Ask your health care provider to recommend a registered dietitian, diabetes educator, or weight loss program.  Look for local or online weight loss groups.  Join a gym, fitness club, or outdoor activity group, such as a walking club. Where to find more information To learn more about diabetes and diabetes prevention, visit:  American Diabetes Association (ADA): www.diabetes.[employer] of Diabetes and Digestive and Kidney Diseases: FindSpin.nl To learn more about healthy eating, visit:  The U.S. Department of Agriculture Scientist, research (physical sciences)), Choose My Plate:  http://wiley-williams.com/  Office of Disease Prevention and Health Promotion (ODPHP), Dietary Guidelines: SurferLive.at Summary  You can reduce your risk for type 2 diabetes by increasing your physical activity, eating healthy foods, and losing weight as directed.  Talk with your health care provider about your risk for type 2 diabetes. Ask about any blood tests or screening tests that you need to have. This information is not intended to replace advice given to you by your health care provider. Make sure you discuss any questions you have with your health care provider. Document Revised: 02/22/2019 Document Reviewed: 12/22/2015 Elsevier Patient Education  [place]. High Cholesterol  High cholesterol is a condition in which the blood has high levels of a white, waxy, fat-like substance (cholesterol). The human body needs small amounts of cholesterol. The liver makes all the cholesterol that the body needs. Extra (excess) cholesterol comes from the food that we eat. Cholesterol is carried from the liver by the blood through the blood vessels. If you have high cholesterol, deposits (plaques) may build up on the walls of your  blood vessels (arteries). Plaques make the arteries narrower and stiffer. Cholesterol plaques increase your risk for heart attack and stroke. Work with your health care provider to keep your cholesterol levels in a healthy range. What increases the risk? This condition is more likely to develop in people who:  Eat foods that are high in animal fat (saturated fat) or cholesterol.  Are overweight.  Are not getting enough exercise.  Have a family history of high cholesterol. What are the signs or symptoms? There are no symptoms of this condition. How is this diagnosed? This condition may be diagnosed from the results of a blood test.  If you are older than age 26, your health care provider may check your cholesterol every 4-6  years.  You may be checked more often if you already have high cholesterol or other risk factors for heart disease. The blood test for cholesterol measures:  "Bad" cholesterol (LDL cholesterol). This is the main type of cholesterol that causes heart disease. The desired level for LDL is less than 100.  "Good" cholesterol (HDL cholesterol). This type helps to protect against heart disease by cleaning the arteries and carrying the LDL away. The desired level for HDL is 60 or higher.  Triglycerides. These are fats that the body can store or burn for energy. The desired number for triglycerides is lower than 150.  Total cholesterol. This is a measure of the total amount of cholesterol in your blood, including LDL cholesterol, HDL cholesterol, and triglycerides. A healthy number is less than 200. How is this treated? This condition is treated with diet changes, lifestyle changes, and medicines. Diet changes  This may include eating more whole grains, fruits, vegetables, nuts, and fish.  This may also include cutting back on red meat and foods that have a lot of added sugar. Lifestyle changes  Changes may include getting at least 40 minutes of aerobic exercise 3 times a week. Aerobic exercises include walking, biking, and swimming. Aerobic exercise along with a healthy diet can help you maintain a healthy weight.  Changes may also include quitting smoking. Medicines  Medicines are usually given if diet and lifestyle changes have failed to reduce your cholesterol to healthy levels.  Your health care provider may prescribe a statin medicine. Statin medicines have been shown to reduce cholesterol, which can reduce the risk of heart disease. Follow these instructions at home: Eating and drinking If told by your health care provider:  Eat chicken (without skin), fish, veal, shellfish, ground [place] breast, and round or loin cuts of red meat.  Do not eat fried foods or fatty meats, such as hot  dogs and salami.  Eat plenty of fruits, such as apples.  Eat plenty of vegetables, such as broccoli, potatoes, and carrots.  Eat beans, peas, and lentils.  Eat grains such as barley, rice, couscous, and bulgur wheat.  Eat pasta without cream sauces.  Use skim or nonfat milk, and eat low-fat or nonfat yogurt and cheeses.  Do not eat or drink whole milk, cream, ice cream, egg yolks, or hard cheeses.  Do not eat stick margarine or tub margarines that contain trans fats (also called partially hydrogenated oils).  Do not eat saturated tropical oils, such as coconut oil and palm oil.  Do not eat cakes, cookies, crackers, or other baked goods that contain trans fats.  General instructions  Exercise as directed by your health care provider. Increase your activity level with activities such as gardening, walking, and taking the stairs.  Take over-the-counter and prescription medicines only as told by your health care provider.  Do not use any products that contain nicotine or tobacco, such as cigarettes and e-cigarettes. If you need help quitting, ask your health care provider.  Keep all follow-up visits as told by your health care provider. This is important. Contact a health care provider if:  You are struggling to maintain a healthy diet or weight.  You need help to start on an exercise program.  You need help to stop smoking. Get help right away if:  You have chest pain.  You have trouble breathing. This information is not intended to replace advice given to you by your health care provider. Make sure you discuss any questions you have with your health care provider. Document Revised: 11/03/2017 Document Reviewed: 04/30/2016 Elsevier Patient Education  [place].

## 2020-05-04 NOTE — Progress Notes (Signed)
Subjective:   Brooke Hansen is a 64 y.o. female who presents for an Initial Medicare Annual Wellness Visit.  Review of Systems     General:   No F/C, wt loss Pulm:   No DIB, SOB, pleuritic chest pain Card:  No CP, palpitations Abd:  No n/v/d or pain Ext:  No inc edema from baseline  Objective:    Today's Vitals   05/04/20 1313  BP: 125/77  Pulse: 62  Temp: 98.2 F (36.8 C)  TempSrc: Oral  SpO2: 100%  Weight: 139 lb 3.2 oz (63.1 kg)  Height: 5' 3.5" (1.613 m)   Body mass index is 24.27 kg/m.  Advanced Directives 02/10/2015 01/27/2015  Does Patient Have a Medical Advance Directive? No No  Would patient like information on creating a medical advance directive? No - patient declined information -    Current Medications (verified) Outpatient Encounter Medications as of 05/04/2020  Medication Sig  . albuterol (PROAIR HFA) 108 (90 Base) MCG/ACT inhaler Inhale 2 puffs into the lungs every 6 (six) hours as needed for wheezing or shortness of breath.  Marland Kitchen atorvastatin (LIPITOR) 40 MG tablet Take 1 tablet (40 mg total) by mouth at bedtime.  . Calcium Carbonate-Vitamin D (CALCIUM + D PO) Take by mouth daily.  . cyanocobalamin (,VITAMIN B-12,) 1000 MCG/ML injection INJECT 1 ML INTO THE MUSCLE EVERY 30 DAYS.  . fluticasone (FLONASE) 50 MCG/ACT nasal spray PLACE 2 SPRAYS IN EACH NOSTRIL DAILY.  . meclizine (ANTIVERT) 25 MG tablet Take 1 tablet (25 mg total) by mouth 3 (three) times daily as needed for dizziness.  . meloxicam (MOBIC) 15 MG tablet Take 1 tablet by mouth daily.  . Vitamin D, Ergocalciferol, (DRISDOL) 1.25 MG (50000 UNIT) CAPS capsule TAKE 1 CAPSULE BY MOUTH EVERY 7 DAYS.   No facility-administered encounter medications on file as of 05/04/2020.    Allergies (verified) Septra [sulfamethoxazole-trimethoprim] and Ampicillin   History: Past Medical History:  Diagnosis Date  . Allergy   . Anemia   . Asthma   . History of nephrectomy    right - nonfunctioning  kidney  . Hyperlipidemia   . RLS (restless legs syndrome)   . Transfusion of blood during current hospitalization    Past Surgical History:  Procedure Laterality Date  . CESAREAN SECTION  1986  . NEPHRECTOMY Right 1975   Family History  Problem Relation Age of Onset  . Alcohol abuse Other   . Diabetes Other   . Hyperlipidemia Other   . Coronary artery disease Other   . Hyperlipidemia Mother   . Cancer Father        lung  . Heart disease Father   . Heart disease Maternal Grandfather   . Colon cancer Neg Hx    Social History   Socioeconomic History  . Marital status: Married    Spouse name: Not on file  . Number of children: Not on file  . Years of education: Not on file  . Highest education level: Not on file  Occupational History  . Not on file  Tobacco Use  . Smoking status: Former Smoker    Packs/day: 1.00    Years: 10.00    Pack years: 10.00    Quit date: 11/15/1983    Years since quitting: 36.4  . Smokeless tobacco: Never Used  Vaping Use  . Vaping Use: Never used  Substance and Sexual Activity  . Alcohol use: Yes    Alcohol/week: 2.0 standard drinks    Types: 2  Standard drinks or equivalent per week  . Drug use: No  . Sexual activity: Not Currently    Birth control/protection: None  Other Topics Concern  . Not on file  Social History Narrative  . Not on file   Social Determinants of Health   Financial Resource Strain:   . Difficulty of Paying Living Expenses:   Food Insecurity:   . Worried About Charity fundraiser in the Last Year:   . Arboriculturist in the Last Year:   Transportation Needs:   . Film/video editor (Medical):   Marland Kitchen Lack of Transportation (Non-Medical):   Physical Activity:   . Days of Exercise per Week:   . Minutes of Exercise per Session:   Stress:   . Feeling of Stress :   Social Connections:   . Frequency of Communication with Friends and Family:   . Frequency of Social Gatherings with Friends and Family:   . Attends  Religious Services:   . Active Member of Clubs or Organizations:   . Attends Archivist Meetings:   Marland Kitchen Marital Status:     Tobacco Counseling Counseling given: Not Answered   Clinical Intake:   Diabetic? No, pre-diabetes, most recent A1c 6.0   Activities of Daily Living In your present state of health, do you have any difficulty performing the following activities: 05/04/2020  Hearing? N  Vision? N  Difficulty concentrating or making decisions? N  Walking or climbing stairs? N  Dressing or bathing? N  Doing errands, shopping? N  Some recent data might be hidden    Patient Care Team: Lorrene Reid, PA-C as PCP - General Luberta Mutter, MD as Consulting Physician (Ophthalmology)  Indicate any recent Medical Services you may have received from other than [facility] providers in the past year (date may be approximate).     Assessment:   This is a routine wellness examination for Brooke Hansen.  Hearing/Vision screen No exam data present  Dietary issues and exercise activities discussed: - Follow heart healthy/Mediterranen diet.  - Continue physical activity level: cardio/strength training 5 times/wk and yoga 2 timeswk  Goals   None    Depression Screen PHQ 2/9 Scores 10/31/2019 05/02/2019 05/07/2018 04/30/2018 03/06/2018  PHQ - 2 Score 0 0 0 0 0  PHQ- 9 Score 0 0 0 0 0    Fall Risk Fall Risk  05/04/2020 05/02/2019 05/07/2018 04/30/2018 03/06/2018  Falls in the past year? 0 0 No No No  Risk for fall due to : No Fall Risks - - - -  Follow up Falls evaluation completed Falls evaluation completed - - -    Any stairs in or around the home? Yes  If so, are there any without handrails? Yes  Home free of loose throw rugs in walkways, pet beds, electrical cords, etc? Yes  Adequate lighting in your home to reduce risk of falls? Yes   ASSISTIVE DEVICES UTILIZED TO PREVENT FALLS:  Life alert? No  Use of a cane, walker or w/c? No  Grab bars in the bathroom? No  Shower  chair or bench in shower? Yes  Elevated toilet seat or a handicapped toilet? Yes   TIMED UP AND GO:  Was the test performed? Yes .  Length of time to ambulate 10 feet: 15 sec.   Gait steady and fast without use of assistive device  Cognitive Function: WNL     6CIT Screen 05/04/2020  What Year? 0 points  What month? 0 points  What time?  0 points  Count back from 20 0 points  Months in reverse 0 points  Repeat phrase 2 points  Total Score 2    Immunizations Immunization History  Administered Date(s) Administered  . Influenza Split 08/21/2013  . Influenza Whole 11/14/2006  . Influenza, High Dose Seasonal PF 08/17/2019  . Influenza-Unspecified 09/08/2014, 08/15/2015  . PFIZER SARS-COV-2 Vaccination 12/05/2019, 12/26/2019  . PPD Test 06/21/2013  . Pneumococcal Polysaccharide-23 11/04/2019  . Td 11/14/2002  . Tdap 09/09/2013  . Zoster 12/07/2015  . Zoster Recombinat (Shingrix) 03/20/2017, 05/29/2017    TDAP status: Up to date Flu Vaccine status: Up to date Pneumococcal vaccine status: Up to date Covid-19 vaccine status: Completed vaccines  Qualifies for Shingles Vaccine? Yes   Zostavax completed No   Shingrix Completed?: Yes  Screening Tests Health Maintenance  Topic Date Due  . Hepatitis C Screening  Never done  . HIV Screening  Never done  . INFLUENZA VACCINE  06/14/2020  . PNA vac Low Risk Adult (2 of 2 - PCV13) 11/03/2020  . MAMMOGRAM  10/17/2021  . PAP SMEAR-Modifier  05/01/2022  . TETANUS/TDAP  09/10/2023  . COLONOSCOPY  02/09/2025  . DEXA SCAN  Completed  . COVID-19 Vaccine  Completed    Health Maintenance  Health Maintenance Due  Topic Date Due  . Hepatitis C Screening  Never done  . HIV Screening  Never done    Colorectal cancer screening: Completed 02/10/2015. Repeat every 10 years Mammogram status: Completed 10/18/2019. Repeat every year Bone Density status: Completed 09/10/2018. Results reflect: Bone density results: NORMAL. Repeat every 3  years.  Lung Cancer Screening: (Low Dose CT Chest recommended if Age 30-80 years, 30 pack-year currently smoking OR have quit w/in 15years.) does not qualify.    Additional Screening:  Hepatitis C Screening: does qualify; Completed Pt declined  Vision Screening: Recommended annual ophthalmology exams for early detection of glaucoma and other disorders of the eye. Is the patient up to date with their annual eye exam?  Yes  Who is the provider or what is the name of the office in which the patient attends annual eye exams? Dr. Ellie Lunch  Dental Screening: Recommended annual dental exams for proper oral hygiene  Community Resource Referral / Chronic Care Management: CRR required this visit?  No   CCM required this visit?  No      Plan:  - Continue current medication regimen. - Stay well hydrated, at least 64 fl oz - Pt declined Hep C and HIV screening. - EKG: NSR with no ST abnormalities, rate 60 bpm - Will continue to monitor plantar fibroma. If becomes symptomatic then will place podiatry referral. - Provided medication refills. Discussed with patient careful use of NSAIDs due to CKD, and provided refill of Meloxicam with instructions to use very seldomly and for severe knee pain only. Last CMP- creatinine 1.03 and GFR 57. Rechecking CMP today. - Follow up in 6 months for regular OV- HLD, Pre-dm, CKD and FBW (lipid, cmp, a1c) or sooner if needed.  I have personally reviewed and noted the following in the patient's chart:   . Medical and social history . Use of alcohol, tobacco or illicit drugs  . Current medications and supplements . Functional ability and status . Nutritional status . Physical activity . Advanced directives . List of other physicians . Hospitalizations, surgeries, and ER visits in previous 12 months . Vitals . Screenings to include cognitive, depression, and falls . Referrals and appointments  In addition, I have reviewed and discussed  with patient certain  preventive protocols, quality metrics, and best practice recommendations. A written personalized care plan for preventive services as well as general preventive health recommendations were provided to patient.        05/04/2020

## 2020-05-05 ENCOUNTER — Other Ambulatory Visit: Payer: Self-pay | Admitting: Family Medicine

## 2020-05-05 DIAGNOSIS — E785 Hyperlipidemia, unspecified: Secondary | ICD-10-CM

## 2020-05-05 DIAGNOSIS — D510 Vitamin B12 deficiency anemia due to intrinsic factor deficiency: Secondary | ICD-10-CM

## 2020-05-05 LAB — COMPREHENSIVE METABOLIC PANEL
ALT: 16 IU/L (ref 0–32)
AST: 20 IU/L (ref 0–40)
Albumin/Globulin Ratio: 1.7 (ref 1.2–2.2)
Albumin: 4.6 g/dL (ref 3.8–4.8)
Alkaline Phosphatase: 88 IU/L (ref 48–121)
BUN/Creatinine Ratio: 16 (ref 12–28)
BUN: 19 mg/dL (ref 8–27)
Bilirubin Total: 0.3 mg/dL (ref 0.0–1.2)
CO2: 24 mmol/L (ref 20–29)
Calcium: 10.2 mg/dL (ref 8.7–10.3)
Chloride: 102 mmol/L (ref 96–106)
Creatinine, Ser: 1.21 mg/dL — ABNORMAL HIGH (ref 0.57–1.00)
GFR calc Af Amer: 54 mL/min/{1.73_m2} — ABNORMAL LOW (ref >59)
GFR calc non Af Amer: 47 mL/min/{1.73_m2} — ABNORMAL LOW (ref >59)
Globulin, Total: 2.7 g/dL (ref 1.5–4.5)
Glucose: 98 mg/dL (ref 65–99)
Potassium: 4.5 mmol/L (ref 3.5–5.2)
Sodium: 139 mmol/L (ref 134–144)
Total Protein: 7.3 g/dL (ref 6.0–8.5)

## 2020-05-07 ENCOUNTER — Telehealth: Payer: Self-pay | Admitting: Physician Assistant

## 2020-05-07 DIAGNOSIS — E785 Hyperlipidemia, unspecified: Secondary | ICD-10-CM

## 2020-05-07 DIAGNOSIS — D510 Vitamin B12 deficiency anemia due to intrinsic factor deficiency: Secondary | ICD-10-CM

## 2020-05-07 MED ORDER — VITAMIN D (ERGOCALCIFEROL) 1.25 MG (50000 UNIT) PO CAPS: ORAL_CAPSULE | ORAL | 0 refills | Status: DC

## 2020-05-07 MED ORDER — ATORVASTATIN CALCIUM 40 MG PO TABS: 40.0000 mg | ORAL_TABLET | Freq: Every day | ORAL | 0 refills | Status: DC

## 2020-05-07 MED ORDER — CYANOCOBALAMIN 1000 MCG/ML IJ SOLN: INTRAMUSCULAR | 0 refills | Status: DC

## 2020-05-07 NOTE — Addendum Note (Signed)
Addended by: Mickel Crow on: 05/07/2020 11:22 AM   Modules accepted: Orders

## 2020-05-07 NOTE — Telephone Encounter (Signed)
Patient came into office to check status of refills. She is requesting a refill of her Vit D, Vit B12, and atorvastatin. The refill request was being sent to previous PCP so during this wait she is completely out of the statin, Can we expedite this request to [facility] for the patient?

## 2020-05-07 NOTE — Telephone Encounter (Signed)
Refills sent to requested pharmacy. AS, CMA 

## 2020-06-27 ENCOUNTER — Encounter: Payer: Self-pay | Admitting: *Deleted

## 2020-06-27 ENCOUNTER — Other Ambulatory Visit: Payer: Self-pay

## 2020-06-27 ENCOUNTER — Ambulatory Visit: Payer: Self-pay

## 2020-06-27 ENCOUNTER — Ambulatory Visit
Admission: EM | Admit: 2020-06-27 | Discharge: 2020-06-27 | Disposition: A | Discharge status: Home / Self Care | Payer: Medicare PPO | Attending: Emergency Medicine | Admitting: Emergency Medicine

## 2020-06-27 DIAGNOSIS — N760 Acute vaginitis: Secondary | ICD-10-CM

## 2020-06-27 DIAGNOSIS — R300 Dysuria: Secondary | ICD-10-CM | POA: Diagnosis present

## 2020-06-27 LAB — POCT URINALYSIS DIP (MANUAL ENTRY)
Bilirubin, UA: NEGATIVE
Glucose, UA: NEGATIVE mg/dL
Ketones, POC UA: NEGATIVE mg/dL
Nitrite, UA: NEGATIVE
Protein Ur, POC: NEGATIVE mg/dL
Spec Grav, UA: <=1.005 — AB (ref 1.010–1.025)
Urobilinogen, UA: 0.2 E.U./dL
pH, UA: 6 (ref 5.0–8.0)

## 2020-06-27 MED ORDER — FLUCONAZOLE 150 MG PO TABS: 150.0000 mg | ORAL_TABLET | Freq: Every day | ORAL | 0 refills | Status: DC

## 2020-06-27 NOTE — ED Triage Notes (Signed)
Reports self-treating for possible yeast infection approx 1 wk ago when experiencing genital pruritis. Yesterday started with dysuria and slight polyuria.  Denies fevers.

## 2020-06-27 NOTE — ED Provider Notes (Signed)
[facility]    CSN: [id number] Arrival date & time: 06/27/20  1122      History   Chief Complaint Chief Complaint  Patient presents with  . Appointment    1100  . Dysuria    HPI Brooke Hansen is a 64 y.o. female presenting for dysuria since last night.  States that she did OTC treatment for yeast infection 1 week prior due to general pruritus.  Denying this, though noticing some burning when urine hits labia.  Denies rash, ulceration, concern for STI or discharge.  No pelvic pain, back pain, fever.  Did have some urinary frequency other day, though states it is improved.   Past Medical History:  Diagnosis Date  . Allergy   . Anemia   . Asthma   . History of nephrectomy    right - nonfunctioning kidney  . Hyperlipidemia   . RLS (restless legs syndrome)   . Transfusion of blood during current hospitalization     Patient Active Problem List   Diagnosis Date Noted  . Personal history of noncompliance with medical treatment and regimen 10/31/2019  . Prediabetes 05/07/2018  . Stage III chronic kidney disease 05/07/2018  . Vitamin D insufficiency 05/07/2018  . ETD (Eustachian tube dysfunction), right 04/30/2018  . Chronic vertigo-  seen by ENT in past 04/30/2018  . Primary osteoarthritis of right knee 04/30/2018  . counseling for NSAID long-term use-  Mobic given to pt by Ortho for R knee OA 04/30/2018  . Renal agenesis, unspecified-in 1970s patient had a "shriveled up disease kidney "; status post right nephrectomy  04/30/2018  . Reactive airway disease that is not asthma-due to environmental allergens 03/06/2018  . Environmental and seasonal allergies 03/06/2018  . Chronic pain of right knee with instability 03/06/2018  . Status post lateral meniscectomy of right knee 03/06/2018  . Other instability, right knee 03/06/2018  . Screening for cardiovascular condition 03/20/2017  . Routine general medical examination at a health care facility 02/22/2016  .  Right-sided chest wall pain 10/05/2015  . Pernicious anemia 10/20/2014  . Dysplastic nevus of trunk 04/25/2013  . Vaginitis, atrophic 04/07/2011  . Hyperlipidemia 09/11/2007  . Allergic rhinitis 09/11/2007    Past Surgical History:  Procedure Laterality Date  . CESAREAN SECTION  1986  . NEPHRECTOMY Right 1975    OB History    Gravida  1   Para      Term      Preterm      AB      Living        SAB      TAB      Ectopic      Multiple      Live Births               Home Medications    Prior to Admission medications   Medication Sig Start Date End Date Taking? Authorizing Provider  atorvastatin (LIPITOR) 40 MG tablet Take 1 tablet (40 mg total) by mouth at bedtime. 05/07/20  Yes Abonza, Maritza, PA-C  cyanocobalamin (,VITAMIN B-12,) 1000 MCG/ML injection INJECT 1 ML INTO THE MUSCLE EVERY 30 DAYS. 05/07/20  Yes Abonza, Maritza, PA-C  fluticasone (FLONASE) 50 MCG/ACT nasal spray PLACE 2 SPRAYS IN EACH NOSTRIL DAILY. 06/10/19  Yes Opalski, Deborah, DO  Vitamin D, Ergocalciferol, (DRISDOL) 1.25 MG (50000 UNIT) CAPS capsule TAKE 1 CAPSULE BY MOUTH EVERY 7 DAYS. 05/07/20  Yes Abonza, Maritza, PA-C  albuterol (PROAIR HFA) 108 (90 Base) MCG/ACT inhaler  Inhale 2 puffs into the lungs every 6 (six) hours as needed for wheezing or shortness of breath. 02/06/19   Opalski, Neoma Laming, DO  fluconazole (DIFLUCAN) 150 MG tablet Take 1 tablet (150 mg total) by mouth daily. May repeat in 72 hours if needed 06/27/20   Hall-Potvin, [place], PA-C    Family History Family History  Problem Relation Age of Onset  . Alcohol abuse Other   . Diabetes Other   . Hyperlipidemia Other   . Coronary artery disease Other   . Hyperlipidemia Mother   . Cancer Father        lung  . Heart disease Father   . Heart disease Maternal Grandfather   . Colon cancer Neg Hx     Social History Social History   Tobacco Use  . Smoking status: Former Smoker    Packs/day: 1.00    Years: 10.00    Pack  years: 10.00    Quit date: 11/15/1983    Years since quitting: 36.6  . Smokeless tobacco: Never Used  Vaping Use  . Vaping Use: Never used  Substance Use Topics  . Alcohol use: Yes    Alcohol/week: 2.0 standard drinks    Types: 2 Standard drinks or equivalent per week  . Drug use: No     Allergies   Septra [sulfamethoxazole-trimethoprim] and Ampicillin   Review of Systems As per HPI  Physical Exam Triage Vital Signs ED Triage Vitals [06/27/20 1147]  Enc Vitals Group     BP (!) 147/97     Pulse Rate 92     Resp 16     Temp 98.1 F (36.7 C)     Temp Source Oral     SpO2 95 %     Weight      Height      Head Circumference      Peak Flow      Pain Score 0     Pain Loc      Pain Edu?      Excl. in Colon?    No data found.  Updated Vital Signs BP (!) 147/97   Pulse 92   Temp 98.1 F (36.7 C) (Oral)   Resp 16   SpO2 95%   Visual Acuity Right Eye Distance:   Left Eye Distance:   Bilateral Distance:    Right Eye Near:   Left Eye Near:    Bilateral Near:     Physical Exam Constitutional:      General: She is not in acute distress. HENT:     Head: Normocephalic and atraumatic.  Eyes:     General: No scleral icterus.    Pupils: Pupils are equal, round, and reactive to light.  Cardiovascular:     Rate and Rhythm: Normal rate.  Pulmonary:     Effort: Pulmonary effort is normal.  Abdominal:     General: Bowel sounds are normal.     Palpations: Abdomen is soft.     Tenderness: There is no abdominal tenderness. There is no right CVA tenderness, left CVA tenderness or guarding.  Skin:    Coloration: Skin is not jaundiced or pale.  Neurological:     Mental Status: She is alert and oriented to person, place, and time.      UC Treatments / Results  Labs (all labs ordered are listed, but only abnormal results are displayed) Labs Reviewed  POCT URINALYSIS DIP (MANUAL ENTRY) - Abnormal; Notable for the following components:      Result  Value   Clarity, UA  cloudy (*)    Spec Grav, UA <=1.005 (*)    Blood, UA small (*)    Leukocytes, UA Large (3+) (*)    All other components within normal limits  URINE CULTURE    EKG   Radiology No results found.  Procedures Procedures (including critical care time)  Medications Ordered in UC Medications - No data to display  Initial Impression / Assessment and Plan / UC Course  I have reviewed the triage vital signs and the nursing notes.  Pertinent labs & imaging results that were available during my care of the patient were reviewed by me and considered in my medical decision making (see chart for details).     Urine with leukocytes, blood.  Will await culture prior to treating for UTI given mild symptoms and burning with urination s/p vaginal yeast infection: Reviewed this could be due to microabrasions.  Will complete treatment for yeast with Diflucan.  Return precautions discussed, pt verbalized understanding and is agreeable to plan. Final Clinical Impressions(s) / UC Diagnoses   Final diagnoses:  Acute vaginitis  Dysuria     Discharge Instructions     Check MyChart for urine culture! Important to drink plenty of water throughout the day. Return for worsening urinary symptoms, blood in urine, abdominal or back pain, fever.    ED Prescriptions    Medication Sig Dispense Auth. Provider   fluconazole (DIFLUCAN) 150 MG tablet Take 1 tablet (150 mg total) by mouth daily. May repeat in 72 hours if needed 2 tablet Hall-Potvin, [place], PA-C     PDMP not reviewed this encounter.   Hall-Potvin, [place], [place] 06/27/20 1230

## 2020-06-27 NOTE — Discharge Instructions (Addendum)
Check MyChart for urine culture! Important to drink plenty of water throughout the day. Return for worsening urinary symptoms, blood in urine, abdominal or back pain, fever.

## 2020-07-01 ENCOUNTER — Telehealth (HOSPITAL_COMMUNITY): Payer: Self-pay | Admitting: Emergency Medicine

## 2020-07-01 LAB — URINE CULTURE: Culture: >=100000 — AB

## 2020-07-01 MED ORDER — NITROFURANTOIN MONOHYD MACRO 100 MG PO CAPS: 100.0000 mg | ORAL_CAPSULE | Freq: Two times a day (BID) | ORAL | 0 refills | Status: DC

## 2020-07-23 ENCOUNTER — Other Ambulatory Visit: Payer: Self-pay

## 2020-07-23 ENCOUNTER — Ambulatory Visit (INDEPENDENT_AMBULATORY_CARE_PROVIDER_SITE_OTHER): Payer: Medicare PPO | Admitting: Physician Assistant

## 2020-07-23 DIAGNOSIS — R300 Dysuria: Secondary | ICD-10-CM | POA: Diagnosis not present

## 2020-07-23 NOTE — Progress Notes (Signed)
Pt complaining of dysuria. Recently completed antibiotic for UTI. Will send UA for urine culture and start antibiotic if indicated.  Lorrene Reid, PA-C

## 2020-07-25 LAB — URINE CULTURE

## 2020-08-03 ENCOUNTER — Other Ambulatory Visit: Payer: Self-pay | Admitting: Physician Assistant

## 2020-08-03 DIAGNOSIS — J3089 Other allergic rhinitis: Secondary | ICD-10-CM

## 2020-08-03 DIAGNOSIS — D510 Vitamin B12 deficiency anemia due to intrinsic factor deficiency: Secondary | ICD-10-CM

## 2020-08-03 DIAGNOSIS — E785 Hyperlipidemia, unspecified: Secondary | ICD-10-CM

## 2020-08-03 DIAGNOSIS — H6981 Other specified disorders of Eustachian tube, right ear: Secondary | ICD-10-CM

## 2020-09-28 ENCOUNTER — Other Ambulatory Visit: Payer: Self-pay | Admitting: Family Medicine

## 2020-09-28 DIAGNOSIS — Z1231 Encounter for screening mammogram for malignant neoplasm of breast: Secondary | ICD-10-CM

## 2020-10-13 ENCOUNTER — Other Ambulatory Visit: Payer: Self-pay

## 2020-10-13 ENCOUNTER — Ambulatory Visit
Admission: RE | Admit: 2020-10-13 | Discharge: 2020-10-13 | Disposition: A | Discharge status: Home / Self Care | Payer: Medicare PPO | Source: Ambulatory Visit | Attending: Emergency Medicine | Admitting: Emergency Medicine

## 2020-10-13 VITALS — BP 119/69 | HR 73 | Temp 98.1°F | Resp 20

## 2020-10-13 DIAGNOSIS — R0782 Intercostal pain: Secondary | ICD-10-CM

## 2020-10-13 MED ORDER — TIZANIDINE HCL 2 MG PO TABS: 2.0000 mg | ORAL_TABLET | Freq: Four times a day (QID) | ORAL | 0 refills | Status: AC | PRN

## 2020-10-13 NOTE — ED Provider Notes (Signed)
[facility]    CSN: [id number] Arrival date & time: 10/13/20  1344      History   Chief Complaint Chief Complaint  Patient presents with  . Fall  . Chest Pain    HPI Brooke Hansen is a 64 y.o. female  With history as below presenting for left chest pain.  States that she fell 1 week prior: No head trauma, LOC.  States that the wind was knocked out of her with some soreness the next day.  States that pain now radiates to back and is sore when she touches, moves, or takes a deep breath.  Denies shortness of breath, palpitations, lightheadedness or dizziness.  Has remained active throughout this time without impact to activity level.  Past Medical History:  Diagnosis Date  . Allergy   . Anemia   . Asthma   . History of nephrectomy    right - nonfunctioning kidney  . Hyperlipidemia   . RLS (restless legs syndrome)   . Transfusion of blood during current hospitalization     Patient Active Problem List   Diagnosis Date Noted  . Personal history of noncompliance with medical treatment and regimen 10/31/2019  . Prediabetes 05/07/2018  . Stage III chronic kidney disease ([place]) 05/07/2018  . Vitamin D insufficiency 05/07/2018  . ETD (Eustachian tube dysfunction), right 04/30/2018  . Chronic vertigo-  seen by ENT in past 04/30/2018  . Primary osteoarthritis of right knee 04/30/2018  . counseling for NSAID long-term use-  Mobic given to pt by Ortho for R knee OA 04/30/2018  . Renal agenesis, unspecified-in 1970s patient had a "shriveled up disease kidney "; status post right nephrectomy  04/30/2018  . Reactive airway disease that is not asthma-due to environmental allergens 03/06/2018  . Environmental and seasonal allergies 03/06/2018  . Chronic pain of right knee with instability 03/06/2018  . Status post lateral meniscectomy of right knee 03/06/2018  . Other instability, right knee 03/06/2018  . Screening for cardiovascular condition 03/20/2017  . Routine  general medical examination at a health care facility 02/22/2016  . Right-sided chest wall pain 10/05/2015  . Pernicious anemia 10/20/2014  . Dysplastic nevus of trunk 04/25/2013  . Vaginitis, atrophic 04/07/2011  . Hyperlipidemia 09/11/2007  . Allergic rhinitis 09/11/2007    Past Surgical History:  Procedure Laterality Date  . CESAREAN SECTION  1986  . NEPHRECTOMY Right 1975    OB History    Gravida  1   Para      Term      Preterm      AB      Living        SAB      TAB      Ectopic      Multiple      Live Births               Home Medications    Prior to Admission medications   Medication Sig Start Date End Date Taking? Authorizing Provider  albuterol (PROAIR HFA) 108 (90 Base) MCG/ACT inhaler Inhale 2 puffs into the lungs every 6 (six) hours as needed for wheezing or shortness of breath. 02/06/19   Opalski, Neoma Laming, DO  atorvastatin (LIPITOR) 40 MG tablet TAKE 1 TABLET BY MOUTH AT BEDTIME. 08/03/20   Abonza, Maritza, PA-C  cyanocobalamin (,VITAMIN B-12,) 1000 MCG/ML injection INJECT 1 ML INTO THE MUSCLE EVERY 30 DAYS. 08/03/20   Abonza, Maritza, PA-C  fluticasone (FLONASE) 50 MCG/ACT nasal spray PLACE 2 SPRAYS IN  EACH NOSTRIL DAILY. 08/03/20   Lorrene Reid, PA-C  tiZANidine (ZANAFLEX) 2 MG tablet Take 1 tablet (2 mg total) by mouth every 6 (six) hours as needed for up to 5 days for muscle spasms. 10/13/20 10/18/20  Hall-Potvin, [place], PA-C  Vitamin D, Ergocalciferol, (DRISDOL) 1.25 MG (50000 UNIT) CAPS capsule TAKE 1 CAPSULE BY MOUTH EVERY 7 DAYS. 08/03/20   Lorrene Reid, PA-C    Family History Family History  Problem Relation Age of Onset  . Alcohol abuse Other   . Diabetes Other   . Hyperlipidemia Other   . Coronary artery disease Other   . Hyperlipidemia Mother   . Cancer Father        lung  . Heart disease Father   . Heart disease Maternal Grandfather   . Colon cancer Neg Hx     Social History Social History   Tobacco Use  .  Smoking status: Former Smoker    Packs/day: 1.00    Years: 10.00    Pack years: 10.00    Quit date: 11/15/1983    Years since quitting: 36.9  . Smokeless tobacco: Never Used  Vaping Use  . Vaping Use: Never used  Substance Use Topics  . Alcohol use: Yes    Alcohol/week: 2.0 standard drinks    Types: 2 Standard drinks or equivalent per week  . Drug use: No     Allergies   Septra [sulfamethoxazole-trimethoprim] and Ampicillin   Review of Systems Review of Systems  Constitutional: Negative for fatigue and fever.  HENT: Negative for congestion, dental problem, ear pain, facial swelling, hearing loss, sinus pain, sore throat, trouble swallowing and voice change.   Eyes: Negative for photophobia, pain and visual disturbance.  Respiratory: Negative for cough and shortness of breath.   Cardiovascular: Positive for chest pain. Negative for palpitations.  Gastrointestinal: Negative for diarrhea and vomiting.  Musculoskeletal: Negative for arthralgias and myalgias.  Neurological: Negative for dizziness and headaches.     Physical Exam Triage Vital Signs ED Triage Vitals  Enc Vitals Group     BP 10/13/20 1432 119/69     Pulse Rate 10/13/20 1432 73     Resp 10/13/20 1432 20     Temp 10/13/20 1432 98.1 F (36.7 C)     Temp Source 10/13/20 1432 Oral     SpO2 10/13/20 1432 96 %     Weight --      Height --      Head Circumference --      Peak Flow --      Pain Score 10/13/20 1446 0     Pain Loc --      Pain Edu? --      Excl. in [place]? --    No data found.  Updated Vital Signs BP 119/69 (BP Location: Left Arm)   Pulse 73   Temp 98.1 F (36.7 C) (Oral)   Resp 20   SpO2 96%   Visual Acuity Right Eye Distance:   Left Eye Distance:   Bilateral Distance:    Right Eye Near:   Left Eye Near:    Bilateral Near:     Physical Exam Constitutional:      General: She is not in acute distress.    Appearance: She is not ill-appearing or diaphoretic.  HENT:     Head:  Normocephalic and atraumatic.     Right Ear: Tympanic membrane and ear canal normal.     Left Ear: Tympanic membrane and ear canal normal.  Mouth/Throat:     Mouth: Mucous membranes are moist.     Pharynx: Oropharynx is clear. No oropharyngeal exudate or posterior oropharyngeal erythema.  Eyes:     General: No scleral icterus.    Conjunctiva/sclera: Conjunctivae normal.     Pupils: Pupils are equal, round, and reactive to light.  Neck:     Comments: Trachea midline, negative JVD Cardiovascular:     Rate and Rhythm: Normal rate and regular rhythm.     Heart sounds: No murmur heard.  No gallop.   Pulmonary:     Effort: Pulmonary effort is normal. No respiratory distress.     Breath sounds: No wheezing, rhonchi or rales.  Chest:     Chest wall: Deformity, swelling and tenderness present. No mass, crepitus or edema. There is no dullness to percussion.    Musculoskeletal:     Cervical back: Neck supple. No tenderness.  Lymphadenopathy:     Cervical: No cervical adenopathy.  Skin:    Capillary Refill: Capillary refill takes less than 2 seconds.     Coloration: Skin is not jaundiced or pale.     Findings: No rash.  Neurological:     General: No focal deficit present.     Mental Status: She is alert and oriented to person, place, and time.      UC Treatments / Results  Labs (all labs ordered are listed, but only abnormal results are displayed) Labs Reviewed - No data to display  EKG   Radiology No results found.  Procedures Procedures (including critical care time)  Medications Ordered in UC Medications - No data to display  Initial Impression / Assessment and Plan / UC Course  I have reviewed the triage vital signs and the nursing notes.  Pertinent labs & imaging results that were available during my care of the patient were reviewed by me and considered in my medical decision making (see chart for details).     Likely MSK.  Reviewed supportive care as below.   Offered x-ray given injury, despite lack of edema, bruising: Patient declined.  Return precautions discussed, pt verbalized understanding and is agreeable to plan. Final Clinical Impressions(s) / UC Diagnoses   Final diagnoses:  Intercostal pain     Discharge Instructions     Heat therapy (hot compress, warm wash rag, hot showers, etc.) can help relax muscles and soothe muscle aches. Cold therapy (ice packs) can be used to help swelling both after injury and after prolonged use of areas of chronic pain/aches.  Pain medication:  350 mg-1000 mg of Tylenol (acetaminophen) and/or 200 mg - 800 mg of Advil (ibuprofen, Motrin) every 8 hours as needed.  May alternate between the two throughout the day as they are generally safe to take together.  DO NOT exceed more than 3000 mg of Tylenol or 3200 mg of ibuprofen in a 24 hour period as this could damage your stomach, kidneys, liver, or increase your bleeding risk.  May take muscle relaxer as needed for severe pain / spasm.  (This medication may cause you to become tired so it is important you do not drink alcohol or operate heavy machinery while on this medication.  Recommend your first dose to be taken before bedtime to monitor for side effects safely)  Important to follow up with specialist(s) below for further evaluation/management if your symptoms persist or worsen.    ED Prescriptions    Medication Sig Dispense Auth. Provider   tiZANidine (ZANAFLEX) 2 MG tablet Take 1 tablet (2  mg total) by mouth every 6 (six) hours as needed for up to 5 days for muscle spasms. 20 tablet Hall-Potvin, [place], PA-C     I have reviewed the PDMP during this encounter.   Hall-Potvin, [place], [place] 10/13/20 1634

## 2020-10-13 NOTE — ED Triage Notes (Signed)
Pt states fell out in the woods a week ago Sunday and states "kinda knocked the breath out of me for a second". States had soreness to lt side of chest the next day. States now having lt sided chest pain through to back with tenderness to touch. States has been doing water aerobics since this happen. Denies SOB

## 2020-10-13 NOTE — Discharge Instructions (Signed)
Heat therapy (hot compress, warm wash rag, hot showers, etc.) can help relax muscles and soothe muscle aches. Cold therapy (ice packs) can be used to help swelling both after injury and after prolonged use of areas of chronic pain/aches.  Pain medication:  350 mg-1000 mg of Tylenol (acetaminophen) and/or 200 mg - 800 mg of Advil (ibuprofen, Motrin) every 8 hours as needed.  May alternate between the two throughout the day as they are generally safe to take together.  DO NOT exceed more than 3000 mg of Tylenol or 3200 mg of ibuprofen in a 24 hour period as this could damage your stomach, kidneys, liver, or increase your bleeding risk.  May take muscle relaxer as needed for severe pain / spasm.  (This medication may cause you to become tired so it is important you do not drink alcohol or operate heavy machinery while on this medication.  Recommend your first dose to be taken before bedtime to monitor for side effects safely)  Important to follow up with specialist(s) below for further evaluation/management if your symptoms persist or worsen.

## 2020-10-19 ENCOUNTER — Other Ambulatory Visit: Payer: Self-pay | Admitting: Physician Assistant

## 2020-10-19 DIAGNOSIS — E785 Hyperlipidemia, unspecified: Secondary | ICD-10-CM

## 2020-10-19 DIAGNOSIS — Z0000 Encounter for general adult medical examination without abnormal findings: Secondary | ICD-10-CM

## 2020-10-19 DIAGNOSIS — R7303 Prediabetes: Secondary | ICD-10-CM

## 2020-10-23 ENCOUNTER — Other Ambulatory Visit: Payer: Medicare PPO

## 2020-10-23 ENCOUNTER — Other Ambulatory Visit: Payer: Self-pay

## 2020-10-23 DIAGNOSIS — Z0000 Encounter for general adult medical examination without abnormal findings: Secondary | ICD-10-CM | POA: Diagnosis not present

## 2020-10-23 DIAGNOSIS — R7303 Prediabetes: Secondary | ICD-10-CM

## 2020-10-23 DIAGNOSIS — E785 Hyperlipidemia, unspecified: Secondary | ICD-10-CM | POA: Diagnosis not present

## 2020-10-24 LAB — COMPREHENSIVE METABOLIC PANEL
ALT: 17 IU/L (ref 0–32)
AST: 31 IU/L (ref 0–40)
Albumin/Globulin Ratio: 2 (ref 1.2–2.2)
Albumin: 4.6 g/dL (ref 3.8–4.8)
Alkaline Phosphatase: 90 IU/L (ref 44–121)
BUN/Creatinine Ratio: 16 (ref 12–28)
BUN: 18 mg/dL (ref 8–27)
Bilirubin Total: 0.4 mg/dL (ref 0.0–1.2)
CO2: 27 mmol/L (ref 20–29)
Calcium: 10 mg/dL (ref 8.7–10.3)
Chloride: 101 mmol/L (ref 96–106)
Creatinine, Ser: 1.11 mg/dL — ABNORMAL HIGH (ref 0.57–1.00)
GFR calc Af Amer: 60 mL/min/{1.73_m2} (ref >59)
GFR calc non Af Amer: 52 mL/min/{1.73_m2} — ABNORMAL LOW (ref >59)
Globulin, Total: 2.3 g/dL (ref 1.5–4.5)
Glucose: 94 mg/dL (ref 65–99)
Potassium: 4.7 mmol/L (ref 3.5–5.2)
Sodium: 141 mmol/L (ref 134–144)
Total Protein: 6.9 g/dL (ref 6.0–8.5)

## 2020-10-24 LAB — LIPID PANEL
Chol/HDL Ratio: 2.5 ratio (ref 0.0–4.4)
Cholesterol, Total: 183 mg/dL (ref 100–199)
HDL: 74 mg/dL (ref >39)
LDL Chol Calc (NIH): 97 mg/dL (ref 0–99)
Triglycerides: 66 mg/dL (ref 0–149)
VLDL Cholesterol Cal: 12 mg/dL (ref 5–40)

## 2020-10-24 LAB — HEMOGLOBIN A1C
Est. average glucose Bld gHb Est-mCnc: 123 mg/dL
Hgb A1c MFr Bld: 5.9 % — ABNORMAL HIGH (ref 4.8–5.6)

## 2020-10-30 ENCOUNTER — Ambulatory Visit: Payer: Medicare PPO | Admitting: Physician Assistant

## 2020-11-02 ENCOUNTER — Other Ambulatory Visit: Payer: Self-pay | Admitting: Physician Assistant

## 2020-11-02 DIAGNOSIS — E785 Hyperlipidemia, unspecified: Secondary | ICD-10-CM

## 2020-11-05 ENCOUNTER — Ambulatory Visit: Payer: Medicare PPO | Admitting: Physician Assistant

## 2020-11-05 ENCOUNTER — Encounter: Payer: Self-pay | Admitting: Physician Assistant

## 2020-11-05 ENCOUNTER — Other Ambulatory Visit: Payer: Self-pay

## 2020-11-05 VITALS — BP 114/74 | HR 82 | Ht 64.0 in | Wt 138.0 lb

## 2020-11-05 DIAGNOSIS — N1831 Chronic kidney disease, stage 3a: Secondary | ICD-10-CM | POA: Diagnosis not present

## 2020-11-05 DIAGNOSIS — R252 Cramp and spasm: Secondary | ICD-10-CM

## 2020-11-05 DIAGNOSIS — R32 Unspecified urinary incontinence: Secondary | ICD-10-CM

## 2020-11-05 DIAGNOSIS — R7303 Prediabetes: Secondary | ICD-10-CM

## 2020-11-05 DIAGNOSIS — E785 Hyperlipidemia, unspecified: Secondary | ICD-10-CM | POA: Diagnosis not present

## 2020-11-05 DIAGNOSIS — Z905 Acquired absence of kidney: Secondary | ICD-10-CM

## 2020-11-05 NOTE — Patient Instructions (Signed)
Leg Cramps Leg cramps occur when one or more muscles tighten and you have no control over this tightening (involuntary muscle contraction). Muscle cramps can develop in any muscle, but the most common place is in the calf muscles of the leg. Those cramps can occur during exercise or when you are at rest. Leg cramps are painful, and they may last for a few seconds to a few minutes. Cramps may return several times before they finally stop. Usually, leg cramps are not caused by a serious medical problem. In many cases, the cause is not known. Some common causes include:  Excessive physical effort (overexertion), such as during intense exercise.  Overuse from repetitive motions, or doing the same thing over and over.  Staying in a certain position for a long period of time.  Improper preparation, form, or technique while performing a sport or an activity.  Dehydration.  Injury.  Side effects of certain medicines.  Abnormally low levels of minerals in your blood (electrolytes), especially potassium and calcium. This could result from: ? Pregnancy. ? Taking diuretic medicines. Follow these instructions at home: Eating and drinking  Drink enough fluid to keep your urine pale yellow. Staying hydrated may help prevent cramps.  Eat a healthy diet that includes plenty of nutrients to help your muscles function. A healthy diet includes fruits and vegetables, lean protein, whole grains, and low-fat or nonfat dairy products. Managing pain, stiffness, and swelling      Try massaging, stretching, and relaxing the affected muscle. Do this for several minutes at a time.  If directed, put ice on areas that are sore or painful after a cramp: ? Put ice in a plastic bag. ? Place a towel between your skin and the bag. ? Leave the ice on for 20 minutes, 2-3 times a day.  If directed, apply heat to muscles that are tense or tight. Do this before you exercise, or as often as told by your health care  provider. Use the heat source that your health care provider recommends, such as a moist heat pack or a heating pad. ? Place a towel between your skin and the heat source. ? Leave the heat on for 20-30 minutes. ? Remove the heat if your skin turns bright red. This is especially important if you are unable to feel pain, heat, or cold. You may have a greater risk of getting burned.  Try taking hot showers or baths to help relax tight muscles. General instructions  If you are having frequent leg cramps, avoid intense exercise for several days.  Take over-the-counter and prescription medicines only as told by your health care provider.  Keep all follow-up visits as told by your health care provider. This is important. Contact a health care provider if:  Your leg cramps get more severe or more frequent, or they do not improve over time.  Your foot becomes cold, numb, or blue. Summary  Muscle cramps can develop in any muscle, but the most common place is in the calf muscles of the leg.  Leg cramps are painful, and they may last for a few seconds to a few minutes.  Usually, leg cramps are not caused by a serious medical problem. Often, the cause is not known.  Stay hydrated and take over-the-counter and prescription medicines only as told by your health care provider. This information is not intended to replace advice given to you by your health care provider. Make sure you discuss any questions you have with your health care  provider. Document Revised: 10/13/2017 Document Reviewed: 08/10/2017 Elsevier Patient Education  2020 Elsevier Inc.  

## 2020-11-05 NOTE — Progress Notes (Signed)
Established Patient Office Visit  Subjective:  Patient ID: Brooke Hansen, female    DOB: 04-04-1956  Age: 64 y.o. MRN: [id number]  CC:  Chief Complaint  Patient presents with  . Prediabetes  . Chronic Kidney Disease  . Hyperlipidemia    HPI Brooke Hansen presents for follow up on hyperlipidemia, prediabetes and CKD. Has complaints of urinary urgency and nocturia, a few urinary accidents. Denies dysuria, fever or LBP. Patient was treated for a UTI in 06/2020 and feels since then symptoms have not completely resolved. Reports good hydration. Patient has history of nephrectomy at the age of [age]. Also has noticed occasional bright red blood when wiping related to hemorrhoids. Uses witch hazel pads for symptom relief. Denies N/V/D or abdominal pain. States stool consistency is "doughy." Currently asymptomatic.  Prediabetes: Denies increased thirst or urination from baseline. Since the pandemic has not been eating out as much. Continues to stay active with Yoga.  HLD: Pt taking medication as directed without issues. Reports has been experiencing leg cramps at night. Denies jerky leg movements. Unsure if related to statin or not. Has been on Lipitor for several months and continues to stay hydrated.   CKD: S/p right nephrectomy. Has not seen Urology in several years. Was advised to follow up prn.    Past Medical History:  Diagnosis Date  . Allergy   . Anemia   . Asthma   . History of nephrectomy    right - nonfunctioning kidney  . Hyperlipidemia   . RLS (restless legs syndrome)   . Transfusion of blood during current hospitalization     Past Surgical History:  Procedure Laterality Date  . CESAREAN SECTION  1986  . NEPHRECTOMY Right 1975    Family History  Problem Relation Age of Onset  . Alcohol abuse Other   . Diabetes Other   . Hyperlipidemia Other   . Coronary artery disease Other   . Hyperlipidemia Mother   . Cancer Father        lung  . Heart disease Father    . Heart disease Maternal Grandfather   . Colon cancer Neg Hx     Social History   Socioeconomic History  . Marital status: Married    Spouse name: Not on file  . Number of children: Not on file  . Years of education: Not on file  . Highest education level: Not on file  Occupational History  . Not on file  Tobacco Use  . Smoking status: Former Smoker    Packs/day: 1.00    Years: 10.00    Pack years: 10.00    Quit date: 11/15/1983    Years since quitting: 37.0  . Smokeless tobacco: Never Used  Vaping Use  . Vaping Use: Never used  Substance and Sexual Activity  . Alcohol use: Yes    Alcohol/week: 2.0 standard drinks    Types: 2 Standard drinks or equivalent per week  . Drug use: No  . Sexual activity: Not Currently    Birth control/protection: None  Other Topics Concern  . Not on file  Social History Narrative  . Not on file   Social Determinants of Health   Financial Resource Strain: Not on file  Food Insecurity: Not on file  Transportation Needs: Not on file  Physical Activity: Not on file  Stress: Not on file  Social Connections: Not on file  Intimate Partner Violence: Not on file    Outpatient Medications Prior to Visit  Medication Sig Dispense  Refill  . albuterol (PROAIR HFA) 108 (90 Base) MCG/ACT inhaler Inhale 2 puffs into the lungs every 6 (six) hours as needed for wheezing or shortness of breath. 1 Inhaler 1  . atorvastatin (LIPITOR) 40 MG tablet TAKE 1 TABLET BY MOUTH AT BEDTIME. 90 tablet 0  . cyanocobalamin (,VITAMIN B-12,) 1000 MCG/ML injection INJECT 1 ML INTO THE MUSCLE EVERY 30 DAYS. 3 mL 0  . fluticasone (FLONASE) 50 MCG/ACT nasal spray PLACE 2 SPRAYS IN EACH NOSTRIL DAILY. 16 g 2  . Vitamin D, Ergocalciferol, (DRISDOL) 1.25 MG (50000 UNIT) CAPS capsule TAKE 1 CAPSULE BY MOUTH EVERY 7 DAYS. 12 capsule 0   No facility-administered medications prior to visit.    Allergies  Allergen Reactions  . Septra [Sulfamethoxazole-Trimethoprim] Other (See  Comments)    Per pt: unknown (as teenager)  . Ampicillin Rash    ROS Review of Systems A fourteen system review of systems was performed and found to be positive as per HPI.   Objective:    Physical Exam General:  Well Developed, well nourished, appropriate for stated age.  Neuro:  Alert and oriented,  extra-ocular muscles intact  HEENT:  Normocephalic, atraumatic, neck supple Skin:  no gross rash, warm, pink. Cardiac:  RRR, S1 S2 Respiratory:  ECTA B/L, Not using accessory muscles, speaking in full sentences- unlabored. Vascular:  Ext warm, no cyanosis apprec.;no gross edema Psych:  No HI/SI, judgement and insight good, Euthymic mood. Full Affect.  BP 114/74   Pulse 82   Ht 5\' 4"  (1.626 m)   Wt 138 lb (62.6 kg)   SpO2 99%   BMI 23.69 kg/m  Wt Readings from Last 3 Encounters:  11/05/20 138 lb (62.6 kg)  05/04/20 139 lb 3.2 oz (63.1 kg)  10/31/19 145 lb (65.8 kg)     Health Maintenance Due  Topic Date Due  . Hepatitis C Screening  Never done  . COVID-19 Vaccine (3 - Pfizer risk 4-dose series) 01/23/2020  . PNA vac Low Risk Adult (2 of 2 - PCV13) 11/03/2020    There are no preventive care reminders to display for this patient.  Lab Results  Component Value Date   TSH 1.800 05/02/2019   Lab Results  Component Value Date   WBC 5.1 05/02/2019   HGB 12.8 05/02/2019   HCT 37.9 05/02/2019   MCV 88 05/02/2019   PLT 244 05/02/2019   Lab Results  Component Value Date   NA 141 10/23/2020   K 4.7 10/23/2020   CO2 27 10/23/2020   GLUCOSE 94 10/23/2020   BUN 18 10/23/2020   CREATININE 1.11 (H) 10/23/2020   BILITOT 0.4 10/23/2020   ALKPHOS 90 10/23/2020   AST 31 10/23/2020   ALT 17 10/23/2020   PROT 6.9 10/23/2020   ALBUMIN 4.6 10/23/2020   CALCIUM 10.0 10/23/2020   GFR 60.98 03/13/2017   Lab Results  Component Value Date   CHOL 183 10/23/2020   Lab Results  Component Value Date   HDL 74 10/23/2020   Lab Results  Component Value Date   LDLCALC 97  10/23/2020   Lab Results  Component Value Date   TRIG 66 10/23/2020   Lab Results  Component Value Date   CHOLHDL 2.5 10/23/2020   Lab Results  Component Value Date   HGBA1C 5.9 (H) 10/23/2020      Assessment & Plan:   Problem List Items Addressed This Visit      Genitourinary   Stage III chronic kidney disease ([place])  Other   Hyperlipidemia - Primary   Prediabetes    Other Visit Diagnoses    Urinary incontinence, unspecified type       Relevant Orders   Ambulatory referral to Urology   History of nephrectomy       Relevant Orders   Ambulatory referral to Urology   Leg cramps       Relevant Orders   Magnesium (Completed)   B12 (Completed)     Hyperlipidemia: -Most recent lipid panel: Cholesterol 183, 2 glycerides 66, HDL 74, LDL 97 -Discussed with patient leg cramps could possibly be side effect from Lipitor and recommend to trial different statin or change frequency to three times weekly. Patient prefers to continue monitoring symptoms and continue Lipitor 40 mg. Recommend to continue to stay hydrated, at least 64 fl oz. -Follow a diet low in saturated and trans fats. -Continue with physical activity regimen. -Will continue to monitor.  Prediabetes:  -Most recent A1c 5.9, improved from 6.1. -Recommend to continue with monitoring carbohydrates and glucose. -Continue to stay active. -Will continue to monitor.  Stage III CKD: -Most recent CMP: creatinine 1.11, GFR 52 both have improved. -Will continue to monitor.   Urinary incontinence, History of nephrectomy: -Will place Urology referral for further evaluation and management. -Recommend to reduce/avoid diuretics and Kegel exercises.  -Recommend to avoid drinking 2-3 hours before bedtime.  Leg cramps: -Recent CMP: no electrolyte imbalances  -Will place lab orders for Vitamin B12 and magnesium to evaluate for magnesium or vitamin B12 deficiency. -Recommend to continue good hydration, passive stretching  and deep massage before bedtime.  No orders of the defined types were placed in this encounter.   Follow-up: Return in about 6 months (around 05/06/2021) for CPE and FBW.   Note:  This note was prepared with assistance of Dragon voice recognition software. Occasional wrong-word or sound-a-like substitutions may have occurred due to the inherent limitations of voice recognition software.   Lorrene Reid, PA-C

## 2020-11-06 LAB — VITAMIN B12: Vitamin B-12: 455 pg/mL (ref 232–1245)

## 2020-11-06 LAB — MAGNESIUM: Magnesium: 2.3 mg/dL (ref 1.6–2.3)

## 2020-11-10 ENCOUNTER — Ambulatory Visit
Admission: RE | Admit: 2020-11-10 | Discharge: 2020-11-10 | Disposition: A | Discharge status: Home / Self Care | Payer: Medicare PPO | Source: Ambulatory Visit | Attending: Family Medicine | Admitting: Family Medicine

## 2020-11-10 ENCOUNTER — Other Ambulatory Visit: Payer: Self-pay

## 2020-11-10 DIAGNOSIS — Z1231 Encounter for screening mammogram for malignant neoplasm of breast: Secondary | ICD-10-CM

## 2020-11-11 ENCOUNTER — Telehealth: Payer: Self-pay | Admitting: Physician Assistant

## 2020-11-11 NOTE — Telephone Encounter (Signed)
Inquiring if there is a Mudlogger in [place], [place] referred patient to [place] office. Advised the only office is Careers information officer. thank you

## 2020-11-16 ENCOUNTER — Other Ambulatory Visit: Payer: Self-pay | Admitting: Physician Assistant

## 2020-11-16 DIAGNOSIS — D510 Vitamin B12 deficiency anemia due to intrinsic factor deficiency: Secondary | ICD-10-CM

## 2021-01-29 DIAGNOSIS — H5213 Myopia, bilateral: Secondary | ICD-10-CM | POA: Diagnosis not present

## 2021-01-29 DIAGNOSIS — H2513 Age-related nuclear cataract, bilateral: Secondary | ICD-10-CM | POA: Diagnosis not present

## 2021-01-29 DIAGNOSIS — H35363 Drusen (degenerative) of macula, bilateral: Secondary | ICD-10-CM | POA: Diagnosis not present

## 2021-02-01 ENCOUNTER — Other Ambulatory Visit: Payer: Self-pay | Admitting: Physician Assistant

## 2021-02-01 DIAGNOSIS — E785 Hyperlipidemia, unspecified: Secondary | ICD-10-CM

## 2021-02-17 ENCOUNTER — Other Ambulatory Visit: Payer: Self-pay | Admitting: Physician Assistant

## 2021-02-17 DIAGNOSIS — D510 Vitamin B12 deficiency anemia due to intrinsic factor deficiency: Secondary | ICD-10-CM

## 2021-03-02 ENCOUNTER — Telehealth: Payer: Self-pay | Admitting: Physician Assistant

## 2021-03-02 NOTE — Telephone Encounter (Signed)
patient left vmail - returning call to reschedule. i left message advising to please call us to reschedule- thank you

## 2021-03-08 DIAGNOSIS — L578 Other skin changes due to chronic exposure to nonionizing radiation: Secondary | ICD-10-CM | POA: Diagnosis not present

## 2021-03-08 DIAGNOSIS — L821 Other seborrheic keratosis: Secondary | ICD-10-CM | POA: Diagnosis not present

## 2021-03-08 DIAGNOSIS — D225 Melanocytic nevi of trunk: Secondary | ICD-10-CM | POA: Diagnosis not present

## 2021-03-08 DIAGNOSIS — L814 Other melanin hyperpigmentation: Secondary | ICD-10-CM | POA: Diagnosis not present

## 2021-03-30 ENCOUNTER — Other Ambulatory Visit: Payer: Self-pay | Admitting: Physician Assistant

## 2021-03-30 DIAGNOSIS — H6981 Other specified disorders of Eustachian tube, right ear: Secondary | ICD-10-CM

## 2021-03-30 DIAGNOSIS — J3089 Other allergic rhinitis: Secondary | ICD-10-CM

## 2021-04-08 ENCOUNTER — Ambulatory Visit: Payer: Medicare PPO | Attending: Internal Medicine

## 2021-04-08 ENCOUNTER — Other Ambulatory Visit: Payer: Self-pay

## 2021-04-08 ENCOUNTER — Other Ambulatory Visit (HOSPITAL_BASED_OUTPATIENT_CLINIC_OR_DEPARTMENT_OTHER): Payer: Self-pay

## 2021-04-08 DIAGNOSIS — Z23 Encounter for immunization: Secondary | ICD-10-CM

## 2021-04-08 MED ORDER — COVID-19 MRNA VACC (MODERNA) 100 MCG/0.5ML IM SUSP
INTRAMUSCULAR | 0 refills | Status: DC
  Filled 2021-04-08: qty 0.25, 1d supply, fill #0

## 2021-04-08 NOTE — Progress Notes (Signed)
   Covid-19 Vaccination Clinic  Name:  AZHIA SIEFKEN    MRN: [id number] DOB: 1956-09-24  04/08/2021  Ms. Hinkson was observed post Covid-19 immunization for 15 minutes without incident. She was provided with Vaccine Information Sheet and instruction to access the V-Safe system.   Ms. Boyum was instructed to call 911 with any severe reactions post vaccine: Marland Kitchen Difficulty breathing  . Swelling of face and throat  . A fast heartbeat  . A bad rash all over body  . Dizziness and weakness   Immunizations Administered    Name Date Dose VIS Date Route   Moderna Covid-19 Booster Vaccine 04/08/2021 10:03 AM 0.25 mL 09/02/2020 Intramuscular   Manufacturer: Moderna   Lot: [id number]   [place]: [id number]

## 2021-04-24 ENCOUNTER — Other Ambulatory Visit: Payer: Self-pay | Admitting: Physician Assistant

## 2021-04-28 ENCOUNTER — Other Ambulatory Visit: Payer: Self-pay | Admitting: Physician Assistant

## 2021-04-29 ENCOUNTER — Telehealth: Payer: Self-pay | Admitting: Physician Assistant

## 2021-04-29 NOTE — Telephone Encounter (Signed)
Patient is coming in 6/21 for CPE with [facility]. Patient last labs for Vit D obtained in 2020. Unable to refill this medication until labs have been obtained and levels for Vit D have been checked in order to avoid over supplementation.

## 2021-04-29 NOTE — Telephone Encounter (Signed)
Patient needs a refill on Vitamin D 2 1.25 mg and [facility] did not fill it due to not have a refill request from [place]. I do not see it under patient's medication list. Please advise, thanks.

## 2021-04-30 ENCOUNTER — Other Ambulatory Visit: Payer: Self-pay | Admitting: Physician Assistant

## 2021-04-30 DIAGNOSIS — E785 Hyperlipidemia, unspecified: Secondary | ICD-10-CM

## 2021-05-03 ENCOUNTER — Other Ambulatory Visit: Payer: Self-pay

## 2021-05-03 DIAGNOSIS — Z1329 Encounter for screening for other suspected endocrine disorder: Secondary | ICD-10-CM

## 2021-05-03 DIAGNOSIS — D510 Vitamin B12 deficiency anemia due to intrinsic factor deficiency: Secondary | ICD-10-CM

## 2021-05-03 DIAGNOSIS — E559 Vitamin D deficiency, unspecified: Secondary | ICD-10-CM

## 2021-05-03 DIAGNOSIS — Z136 Encounter for screening for cardiovascular disorders: Secondary | ICD-10-CM

## 2021-05-03 DIAGNOSIS — E785 Hyperlipidemia, unspecified: Secondary | ICD-10-CM

## 2021-05-03 DIAGNOSIS — R7303 Prediabetes: Secondary | ICD-10-CM

## 2021-05-04 ENCOUNTER — Other Ambulatory Visit: Payer: Medicare PPO

## 2021-05-04 ENCOUNTER — Other Ambulatory Visit: Payer: Self-pay

## 2021-05-04 DIAGNOSIS — Z136 Encounter for screening for cardiovascular disorders: Secondary | ICD-10-CM

## 2021-05-04 DIAGNOSIS — E785 Hyperlipidemia, unspecified: Secondary | ICD-10-CM

## 2021-05-04 DIAGNOSIS — R7303 Prediabetes: Secondary | ICD-10-CM | POA: Diagnosis not present

## 2021-05-04 DIAGNOSIS — D510 Vitamin B12 deficiency anemia due to intrinsic factor deficiency: Secondary | ICD-10-CM

## 2021-05-04 DIAGNOSIS — E559 Vitamin D deficiency, unspecified: Secondary | ICD-10-CM | POA: Diagnosis not present

## 2021-05-04 DIAGNOSIS — Z1329 Encounter for screening for other suspected endocrine disorder: Secondary | ICD-10-CM

## 2021-05-05 LAB — COMPREHENSIVE METABOLIC PANEL
ALT: 14 IU/L (ref 0–32)
AST: 22 IU/L (ref 0–40)
Albumin/Globulin Ratio: 1.7 (ref 1.2–2.2)
Albumin: 4.3 g/dL (ref 3.8–4.8)
Alkaline Phosphatase: 103 IU/L (ref 44–121)
BUN/Creatinine Ratio: 19 (ref 12–28)
BUN: 22 mg/dL (ref 8–27)
Bilirubin Total: 0.3 mg/dL (ref 0.0–1.2)
CO2: 28 mmol/L (ref 20–29)
Calcium: 10 mg/dL (ref 8.7–10.3)
Chloride: 99 mmol/L (ref 96–106)
Creatinine, Ser: 1.13 mg/dL — ABNORMAL HIGH (ref 0.57–1.00)
Globulin, Total: 2.5 g/dL (ref 1.5–4.5)
Glucose: 93 mg/dL (ref 65–99)
Potassium: 4.7 mmol/L (ref 3.5–5.2)
Sodium: 138 mmol/L (ref 134–144)
Total Protein: 6.8 g/dL (ref 6.0–8.5)
eGFR: 54 mL/min/{1.73_m2} — ABNORMAL LOW (ref >59)

## 2021-05-05 LAB — LIPID PANEL
Chol/HDL Ratio: 3.1 ratio (ref 0.0–4.4)
Cholesterol, Total: 190 mg/dL (ref 100–199)
HDL: 62 mg/dL (ref >39)
LDL Chol Calc (NIH): 112 mg/dL — ABNORMAL HIGH (ref 0–99)
Triglycerides: 91 mg/dL (ref 0–149)
VLDL Cholesterol Cal: 16 mg/dL (ref 5–40)

## 2021-05-05 LAB — TSH: TSH: 1.93 u[IU]/mL (ref 0.450–4.500)

## 2021-05-05 LAB — HEMOGLOBIN A1C
Est. average glucose Bld gHb Est-mCnc: 137 mg/dL
Hgb A1c MFr Bld: 6.4 % — ABNORMAL HIGH (ref 4.8–5.6)

## 2021-05-05 LAB — CBC
Hematocrit: 41 % (ref 34.0–46.6)
Hemoglobin: 13.1 g/dL (ref 11.1–15.9)
MCH: 28.6 pg (ref 26.6–33.0)
MCHC: 32 g/dL (ref 31.5–35.7)
MCV: 90 fL (ref 79–97)
Platelets: 300 10*3/uL (ref 150–450)
RBC: 4.58 x10E6/uL (ref 3.77–5.28)
RDW: 13.3 % (ref 11.7–15.4)
WBC: 6.7 10*3/uL (ref 3.4–10.8)

## 2021-05-05 LAB — VITAMIN D 25 HYDROXY (VIT D DEFICIENCY, FRACTURES): Vit D, 25-Hydroxy: 45.2 ng/mL (ref 30.0–100.0)

## 2021-05-06 ENCOUNTER — Encounter: Payer: Medicare PPO | Admitting: Physician Assistant

## 2021-05-11 ENCOUNTER — Ambulatory Visit (INDEPENDENT_AMBULATORY_CARE_PROVIDER_SITE_OTHER): Payer: Medicare PPO | Admitting: Physician Assistant

## 2021-05-11 ENCOUNTER — Encounter: Payer: Self-pay | Admitting: Physician Assistant

## 2021-05-11 ENCOUNTER — Other Ambulatory Visit: Payer: Self-pay

## 2021-05-11 VITALS — BP 100/64 | HR 68 | Temp 98.4°F | Ht 64.0 in | Wt 142.8 lb

## 2021-05-11 DIAGNOSIS — Z905 Acquired absence of kidney: Secondary | ICD-10-CM | POA: Diagnosis not present

## 2021-05-11 DIAGNOSIS — D510 Vitamin B12 deficiency anemia due to intrinsic factor deficiency: Secondary | ICD-10-CM

## 2021-05-11 DIAGNOSIS — E559 Vitamin D deficiency, unspecified: Secondary | ICD-10-CM

## 2021-05-11 DIAGNOSIS — Z780 Asymptomatic menopausal state: Secondary | ICD-10-CM

## 2021-05-11 DIAGNOSIS — E2839 Other primary ovarian failure: Secondary | ICD-10-CM | POA: Diagnosis not present

## 2021-05-11 DIAGNOSIS — Z0000 Encounter for general adult medical examination without abnormal findings: Secondary | ICD-10-CM | POA: Diagnosis not present

## 2021-05-11 DIAGNOSIS — N1831 Chronic kidney disease, stage 3a: Secondary | ICD-10-CM

## 2021-05-11 MED ORDER — CYANOCOBALAMIN 1000 MCG/ML IJ SOLN: INTRAMUSCULAR | 1 refills | Status: DC

## 2021-05-11 MED ORDER — VITAMIN D (ERGOCALCIFEROL) 1.25 MG (50000 UNIT) PO CAPS
50000.0000 [IU] | ORAL_CAPSULE | ORAL | 0 refills | Status: DC
Start: 2021-05-11 — End: 2021-08-03

## 2021-05-11 NOTE — Patient Instructions (Signed)
Preventive Care 67 Years and Older, Female Preventive care refers to lifestyle choices and visits with your health care provider that can promote health and wellness. This includes: A yearly physical exam. This is also called an annual wellness visit. Regular dental and eye exams. Immunizations. Screening for certain conditions. Healthy lifestyle choices, such as: Eating a healthy diet. Getting regular exercise. Not using drugs or products that contain nicotine and tobacco. Limiting alcohol use. What can I expect for my preventive care visit? Physical exam Your health care provider will check your: Height and weight. These may be used to calculate your BMI (body mass index). BMI is a measurement that tells if you are at a healthy weight. Heart rate and blood pressure. Body temperature. Skin for abnormal spots. Counseling Your health care provider may ask you questions about your: Past medical problems. Family's medical history. Alcohol, tobacco, and drug use. Emotional well-being. Home life and relationship well-being. Sexual activity. Diet, exercise, and sleep habits. History of falls. Memory and ability to understand (cognition). Work and work Statistician. Pregnancy and menstrual history. Access to firearms. What immunizations do I need?  Vaccines are usually given at various ages, according to a schedule. Your health care provider will recommend vaccines for you based on your age, medicalhistory, and lifestyle or other factors, such as travel or where you work. What tests do I need? Blood tests Lipid and cholesterol levels. These may be checked every 5 years, or more often depending on your overall health. Hepatitis C test. Hepatitis B test. Screening Lung cancer screening. You may have this screening every year starting at age 44 if you have a 30-pack-year history of smoking and currently smoke or have quit within the past 15 years. Colorectal cancer screening. All  adults should have this screening starting at age 39 and continuing until age 65. Your health care provider may recommend screening at age 61 if you are at increased risk. You will have tests every 1-10 years, depending on your results and the type of screening test. Diabetes screening. This is done by checking your blood sugar (glucose) after you have not eaten for a while (fasting). You may have this done every 1-3 years. Mammogram. This may be done every 1-2 years. Talk with your health care provider about how often you should have regular mammograms. Abdominal aortic aneurysm (AAA) screening. You may need this if you are a current or former smoker. BRCA-related cancer screening. This may be done if you have a family history of breast, ovarian, tubal, or peritoneal cancers. Other tests STD (sexually transmitted disease) testing, if you are at risk. Bone density scan. This is done to screen for osteoporosis. You may have this done starting at age 54. Talk with your health care provider about your test results, treatment options,and if necessary, the need for more tests. Follow these instructions at home: Eating and drinking  Eat a diet that includes fresh fruits and vegetables, whole grains, lean protein, and low-fat dairy products. Limit your intake of foods with high amounts of sugar, saturated fats, and salt. Take vitamin and mineral supplements as recommended by your health care provider. Do not drink alcohol if your health care provider tells you not to drink. If you drink alcohol: Limit how much you have to 0-1 drink a day. Be aware of how much alcohol is in your drink. In the [place] one drink equals one 12 oz bottle of beer (355 mL), one 5 oz glass of wine (148 mL), or one 1  oz glass of hard liquor (44 mL).  Lifestyle Take daily care of your teeth and gums. Brush your teeth every morning and night with fluoride toothpaste. Floss one time each day. Stay active. Exercise for at  least 30 minutes 5 or more days each week. Do not use any products that contain nicotine or tobacco, such as cigarettes, e-cigarettes, and chewing tobacco. If you need help quitting, ask your health care provider. Do not use drugs. If you are sexually active, practice safe sex. Use a condom or other form of protection in order to prevent STIs (sexually transmitted infections). Talk with your health care provider about taking a low-dose aspirin or statin. Find healthy ways to cope with stress, such as: Meditation, yoga, or listening to music. Journaling. Talking to a trusted person. Spending time with friends and family. Safety Always wear your seat belt while driving or riding in a vehicle. Do not drive: If you have been drinking alcohol. Do not ride with someone who has been drinking. When you are tired or distracted. While texting. Wear a helmet and other protective equipment during sports activities. If you have firearms in your house, make sure you follow all gun safety procedures. What's next? Visit your health care provider once a year for an annual wellness visit. Ask your health care provider how often you should have your eyes and teeth checked. Stay up to date on all vaccines. This information is not intended to replace advice given to you by your health care provider. Make sure you discuss any questions you have with your healthcare provider. Document Revised: 10/21/2020 Document Reviewed: 10/25/2018 Elsevier Patient Education  2022 [employer].

## 2021-05-11 NOTE — Progress Notes (Signed)
Subjective:     Brooke Hansen is a 65 y.o. female and is here for a comprehensive physical exam. The patient reports no problems.  Social History   Socioeconomic History   Marital status: Married    Spouse name: Not on file   Number of children: Not on file   Years of education: Not on file   Highest education level: Not on file  Occupational History   Not on file  Tobacco Use   Smoking status: Former    Packs/day: 1.00    Years: 10.00    Pack years: 10.00    Types: Cigarettes    Quit date: 11/15/1983    Years since quitting: 37.5   Smokeless tobacco: Never  Vaping Use   Vaping Use: Never used  Substance and Sexual Activity   Alcohol use: Yes    Alcohol/week: 2.0 standard drinks    Types: 2 Standard drinks or equivalent per week   Drug use: No   Sexual activity: Not Currently    Birth control/protection: None  Other Topics Concern   Not on file  Social History Narrative   Not on file   Social Determinants of Health   Financial Resource Strain: Not on file  Food Insecurity: Not on file  Transportation Needs: Not on file  Physical Activity: Not on file  Stress: Not on file  Social Connections: Not on file  Intimate Partner Violence: Not on file   Health Maintenance  Topic Date Due   Hepatitis C Screening  Never done   PNA vac Low Risk Adult (2 of 2 - PCV13) 11/03/2020   INFLUENZA VACCINE  06/14/2021   COVID-19 Vaccine (4 - Booster) 07/09/2021   MAMMOGRAM  11/10/2022   TETANUS/TDAP  09/10/2023   COLONOSCOPY (Pts 45-59yrs Insurance coverage will need to be confirmed)  02/09/2025   DEXA SCAN  Completed   Zoster Vaccines- Shingrix  Completed   HPV VACCINES  Aged Out    The following portions of the patient's history were reviewed and updated as appropriate: allergies, current medications, past family history, past medical history, past social history, past surgical history, and problem list.  Review of Systems Pertinent items noted in HPI and remainder of  comprehensive ROS otherwise negative.   Objective:    BP 100/64   Pulse 68   Temp 98.4 F (36.9 C)   Ht 5\' 4"  (1.626 m)   Wt 142 lb 12.8 oz (64.8 kg)   SpO2 99%   BMI 24.51 kg/m  General appearance: alert, cooperative, and no distress Head: Normocephalic, without obvious abnormality, atraumatic Eyes: conjunctivae/corneas clear. PERRL, EOM's intact. Fundi benign. Ears: normal TM's and external ear canals both ears Nose: Nares normal. Septum midline. Mucosa normal. No drainage or sinus tenderness. Throat: lips, mucosa, and tongue normal; teeth and gums normal Neck: no adenopathy, no JVD, supple, symmetrical, trachea midline, and thyroid not enlarged, symmetric, no tenderness/mass/nodules Back: symmetric, no curvature. ROM normal. No CVA tenderness. Lungs: clear to auscultation bilaterally Breasts: Inspection negative, No nipple retraction or dimpling, No nipple discharge or bleeding, dense breast tissue noted Heart: regular rate and rhythm and S1, S2 normal Abdomen: soft, non-tender; bowel sounds normal; no masses,  no organomegaly Pelvic: not indicated; post-menopausal, no abnormal Pap smears in past Extremities: extremities normal, atraumatic, no cyanosis or edema Pulses: 2+ and symmetric Skin: Skin color, texture, turgor normal. No rashes or lesions Lymph nodes: Cervical, supraclavicular, and axillary nodes normal. Neurologic: Grossly normal    Assessment:    Healthy  female exam.     Plan:  -Discussed most recent lab results which are essentially within normal limits or stable from prior with the exception of A1c and lipid panel. Renal function stable, pt has hx of nephrectomy.  -Continue with physical activity regimen and low fat diet. Reduce simple carbohydrate and glucose intake. -Will place referral to Nephrology.  -Will place order for bone density. -UTD on mammogram, immunizations (requesting pneumococcal vaccine record from [facility]), colonoscopy. Declined hep C  and HIV screenings. -Continue current medication regimen. -Follow up in 3 months for HLD, prediabetes, CKD and FBW (lipid panel, cmp, a1c) few days prior    See After Visit Summary for Counseling Recommendations

## 2021-06-16 ENCOUNTER — Encounter: Payer: Self-pay | Admitting: Physician Assistant

## 2021-06-24 ENCOUNTER — Other Ambulatory Visit: Payer: Self-pay | Admitting: Physician Assistant

## 2021-06-24 DIAGNOSIS — Z1231 Encounter for screening mammogram for malignant neoplasm of breast: Secondary | ICD-10-CM

## 2021-06-30 IMAGING — MG DIGITAL SCREENING BILAT W/ TOMO W/ CAD
6 of 10 series · 6 of 30 positions shown · non-contrast
Comparison: Previous exam(s).

CLINICAL DATA: Screening.

EXAM:
DIGITAL SCREENING BILATERAL MAMMOGRAM WITH TOMO AND CAD

[L MLO synth-2D (1 of 2)]
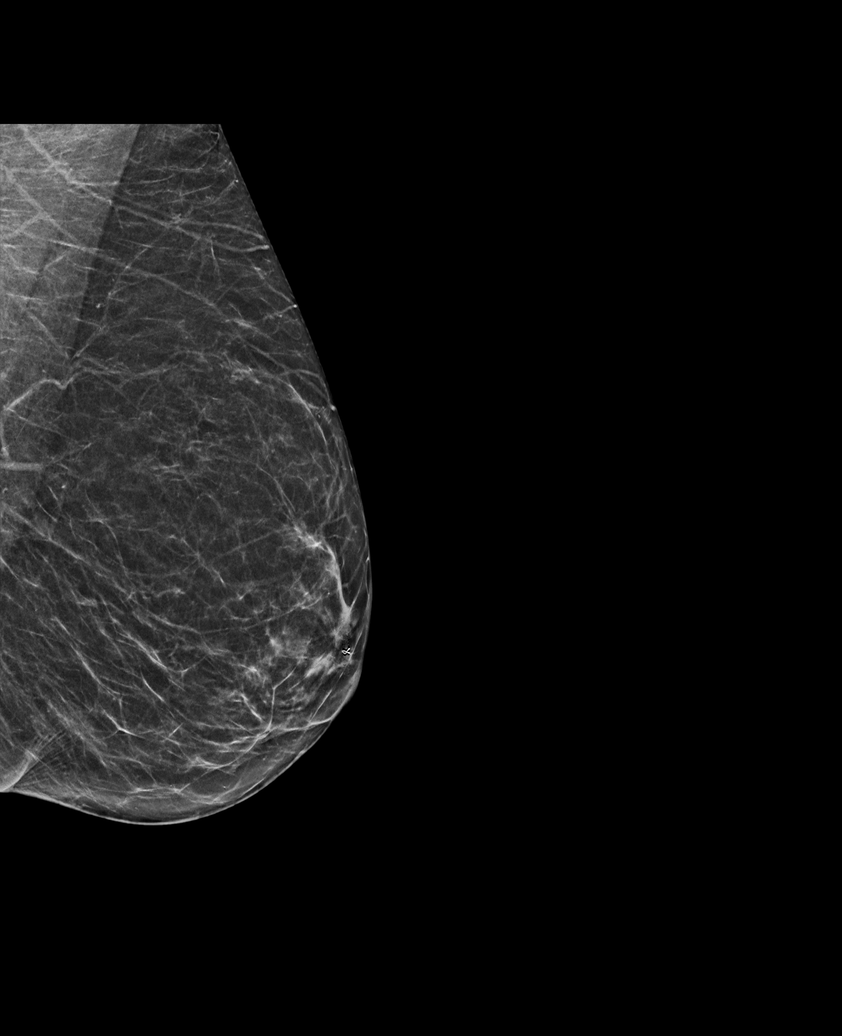

[R MLO synth-2D]
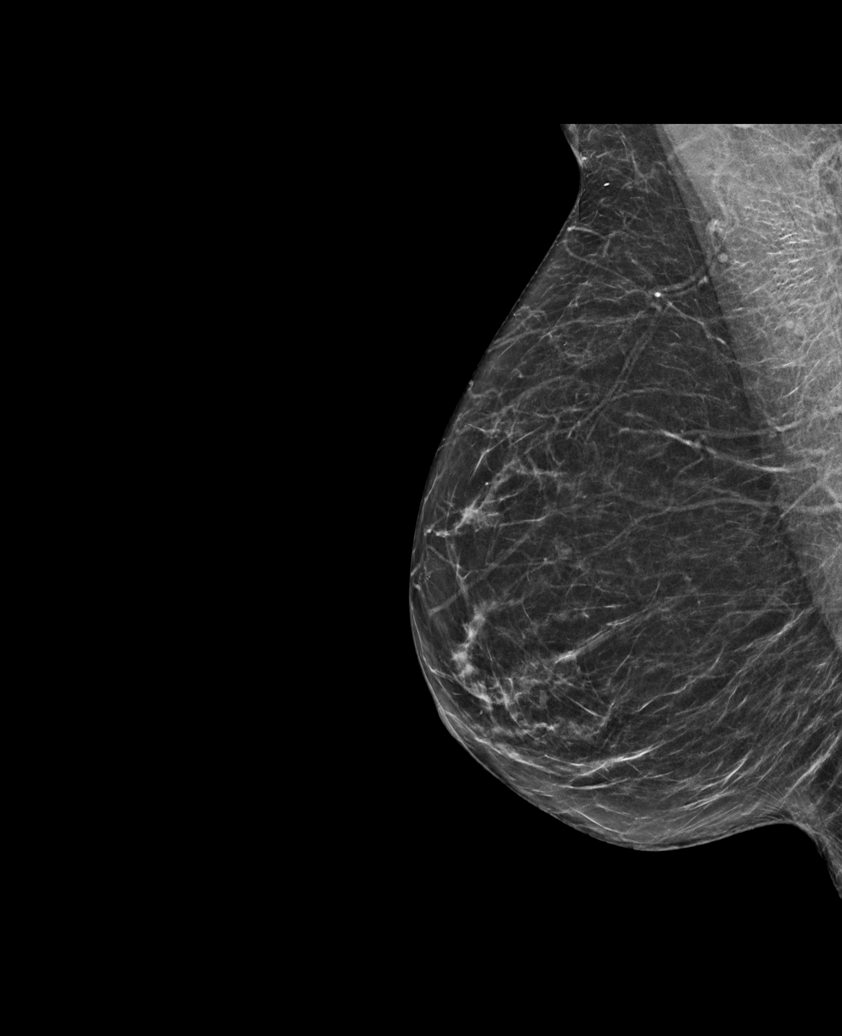

[R CC synth-2D]
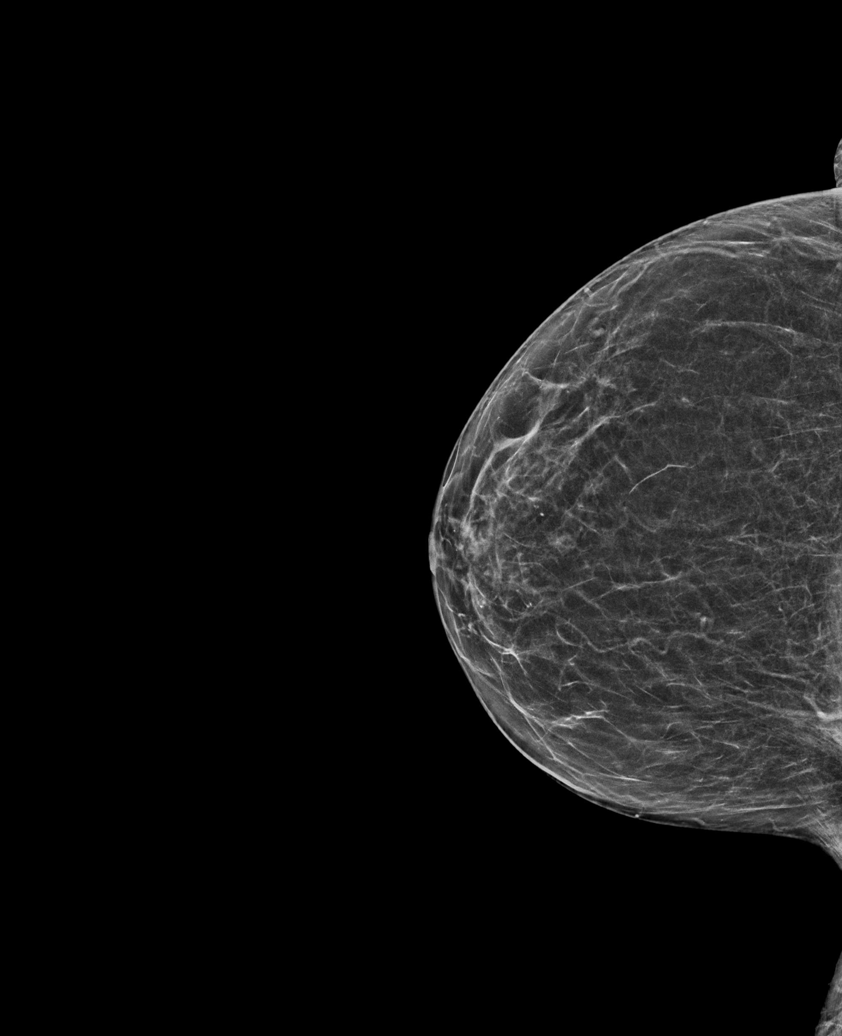

[L MLO synth-2D (2 of 2)]
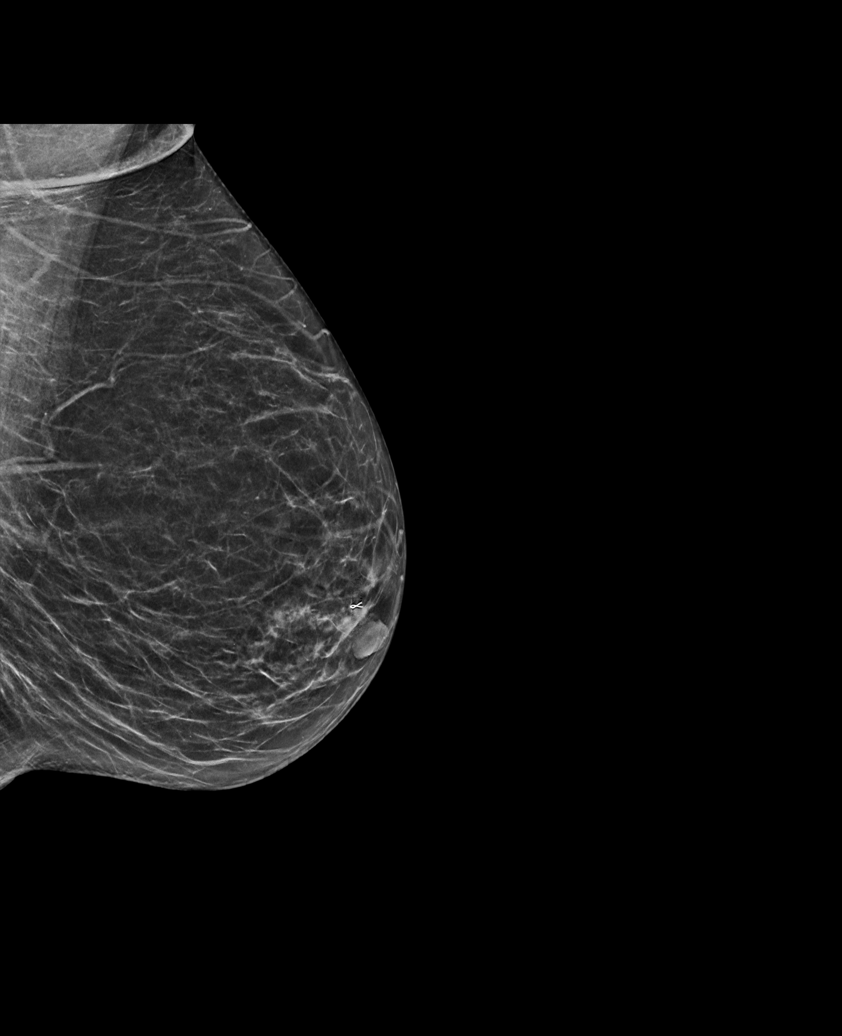

[L CC synth-2D]
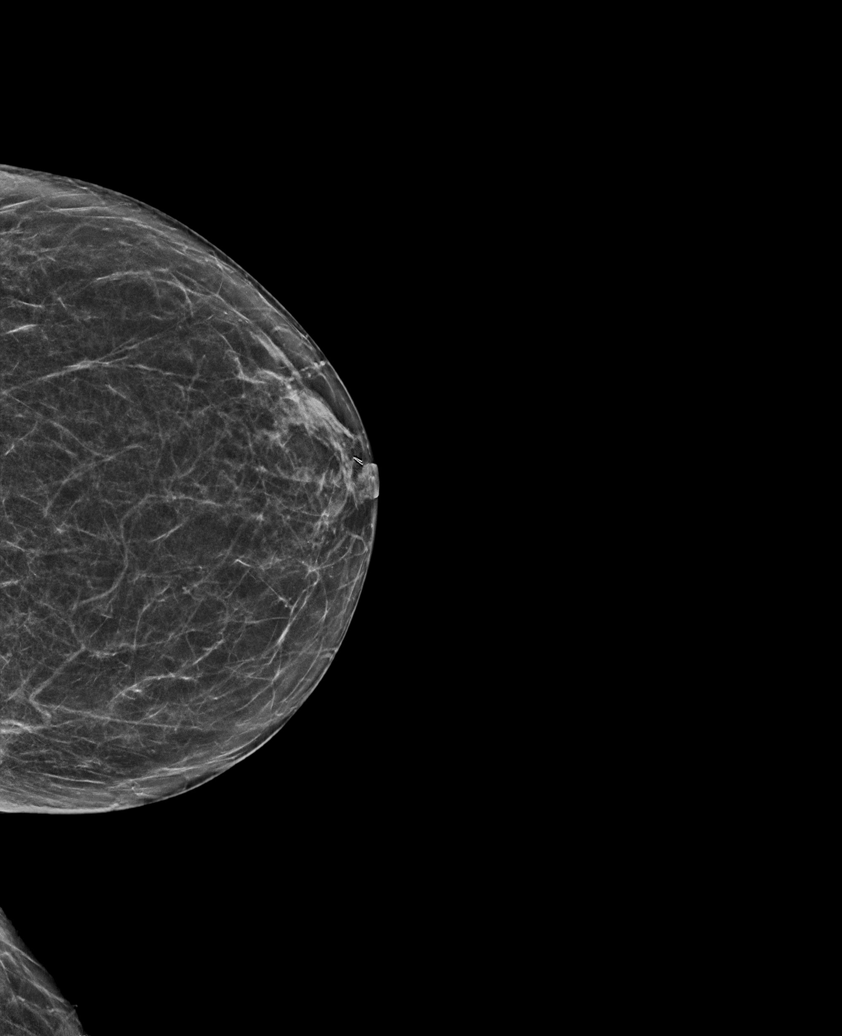

[L MLO tomo · tomo slice 31/60.0]
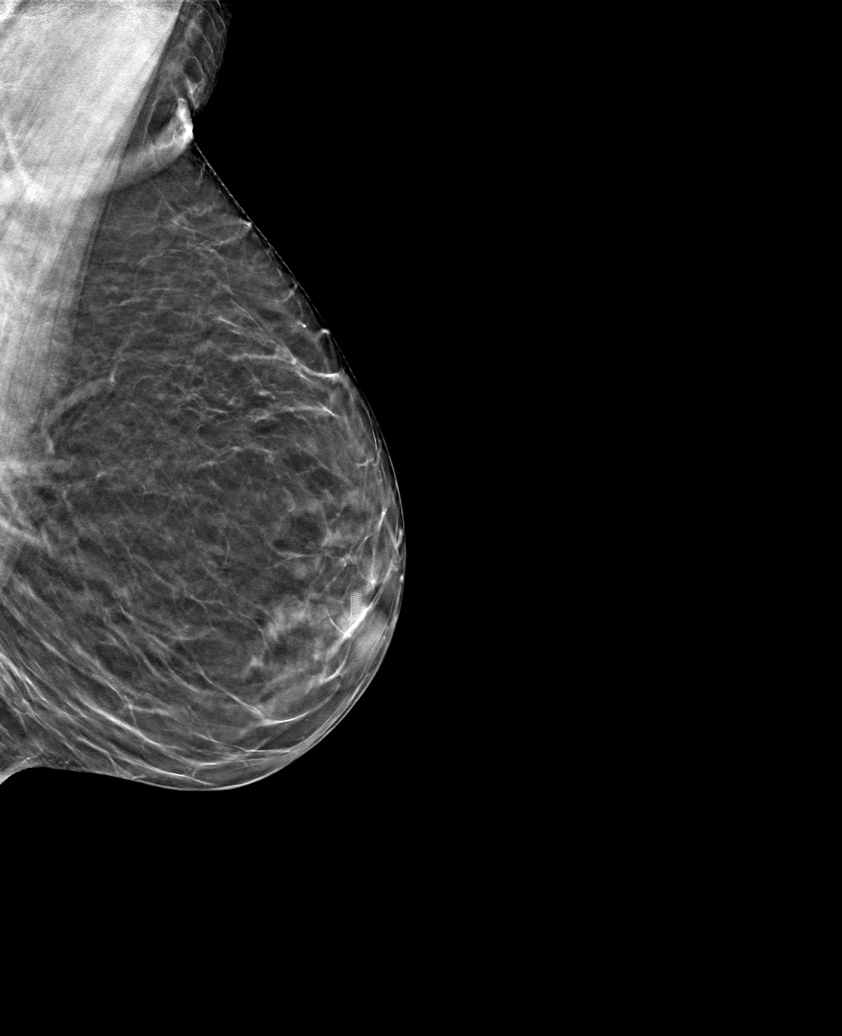

[6 of 30 positions shown; findings below may reference images not displayed]

ACR Breast Density Category b: There are scattered areas of
fibroglandular density.
FINDINGS: There are no findings suspicious for malignancy. Images were
processed with CAD.
IMPRESSION: No mammographic evidence of malignancy. A result letter of this
screening mammogram will be mailed directly to the patient.

RECOMMENDATION:
Screening mammogram in one year. (Code:[id number])

BI-RADS CATEGORY  1: Negative.

## 2021-08-03 ENCOUNTER — Other Ambulatory Visit: Payer: Self-pay | Admitting: Physician Assistant

## 2021-08-03 DIAGNOSIS — E785 Hyperlipidemia, unspecified: Secondary | ICD-10-CM

## 2021-08-03 DIAGNOSIS — E559 Vitamin D deficiency, unspecified: Secondary | ICD-10-CM

## 2021-08-05 ENCOUNTER — Telehealth: Payer: Self-pay | Admitting: Physician Assistant

## 2021-08-05 ENCOUNTER — Other Ambulatory Visit: Payer: Self-pay

## 2021-08-05 DIAGNOSIS — Z136 Encounter for screening for cardiovascular disorders: Secondary | ICD-10-CM

## 2021-08-05 DIAGNOSIS — E785 Hyperlipidemia, unspecified: Secondary | ICD-10-CM

## 2021-08-05 DIAGNOSIS — R7303 Prediabetes: Secondary | ICD-10-CM

## 2021-08-05 NOTE — Telephone Encounter (Signed)
LVM for pt to rtn my call to schedule AWV with [facility]. Please schedule AWV with [facility] if pt calls the office.   Thanks

## 2021-08-05 NOTE — Telephone Encounter (Signed)
Patient declined AWV 

## 2021-08-06 ENCOUNTER — Other Ambulatory Visit: Payer: Self-pay

## 2021-08-06 ENCOUNTER — Other Ambulatory Visit: Payer: Medicare PPO

## 2021-08-06 DIAGNOSIS — E785 Hyperlipidemia, unspecified: Secondary | ICD-10-CM | POA: Diagnosis not present

## 2021-08-06 DIAGNOSIS — Z136 Encounter for screening for cardiovascular disorders: Secondary | ICD-10-CM

## 2021-08-06 DIAGNOSIS — R7303 Prediabetes: Secondary | ICD-10-CM

## 2021-08-07 LAB — COMPREHENSIVE METABOLIC PANEL
ALT: 19 IU/L (ref 0–32)
AST: 28 IU/L (ref 0–40)
Albumin/Globulin Ratio: 2 (ref 1.2–2.2)
Albumin: 4.6 g/dL (ref 3.8–4.8)
Alkaline Phosphatase: 89 IU/L (ref 44–121)
BUN/Creatinine Ratio: 16 (ref 12–28)
BUN: 15 mg/dL (ref 8–27)
Bilirubin Total: 0.3 mg/dL (ref 0.0–1.2)
CO2: 25 mmol/L (ref 20–29)
Calcium: 10.2 mg/dL (ref 8.7–10.3)
Chloride: 98 mmol/L (ref 96–106)
Creatinine, Ser: 0.96 mg/dL (ref 0.57–1.00)
Globulin, Total: 2.3 g/dL (ref 1.5–4.5)
Glucose: 95 mg/dL (ref 65–99)
Potassium: 4.5 mmol/L (ref 3.5–5.2)
Sodium: 137 mmol/L (ref 134–144)
Total Protein: 6.9 g/dL (ref 6.0–8.5)
eGFR: 65 mL/min/{1.73_m2} (ref >59)

## 2021-08-07 LAB — LIPID PANEL
Chol/HDL Ratio: 2.6 ratio (ref 0.0–4.4)
Cholesterol, Total: 176 mg/dL (ref 100–199)
HDL: 68 mg/dL (ref >39)
LDL Chol Calc (NIH): 95 mg/dL (ref 0–99)
Triglycerides: 66 mg/dL (ref 0–149)
VLDL Cholesterol Cal: 13 mg/dL (ref 5–40)

## 2021-08-07 LAB — HEMOGLOBIN A1C
Est. average glucose Bld gHb Est-mCnc: 137 mg/dL
Hgb A1c MFr Bld: 6.4 % — ABNORMAL HIGH (ref 4.8–5.6)

## 2021-08-10 ENCOUNTER — Other Ambulatory Visit: Payer: Self-pay | Admitting: Family Medicine

## 2021-08-10 ENCOUNTER — Other Ambulatory Visit: Payer: Self-pay | Admitting: Physician Assistant

## 2021-08-13 ENCOUNTER — Encounter: Payer: Self-pay | Admitting: Physician Assistant

## 2021-08-13 ENCOUNTER — Other Ambulatory Visit: Payer: Self-pay

## 2021-08-13 ENCOUNTER — Ambulatory Visit: Payer: Medicare PPO | Admitting: Physician Assistant

## 2021-08-13 VITALS — BP 112/62 | HR 86 | Temp 98.4°F | Ht 64.0 in | Wt 135.4 lb

## 2021-08-13 DIAGNOSIS — R7303 Prediabetes: Secondary | ICD-10-CM

## 2021-08-13 DIAGNOSIS — E785 Hyperlipidemia, unspecified: Secondary | ICD-10-CM

## 2021-08-13 DIAGNOSIS — Z905 Acquired absence of kidney: Secondary | ICD-10-CM

## 2021-08-13 DIAGNOSIS — E559 Vitamin D deficiency, unspecified: Secondary | ICD-10-CM

## 2021-08-13 DIAGNOSIS — Z23 Encounter for immunization: Secondary | ICD-10-CM | POA: Diagnosis not present

## 2021-08-13 DIAGNOSIS — N1831 Chronic kidney disease, stage 3a: Secondary | ICD-10-CM | POA: Diagnosis not present

## 2021-08-13 DIAGNOSIS — R32 Unspecified urinary incontinence: Secondary | ICD-10-CM

## 2021-08-13 MED ORDER — ATORVASTATIN CALCIUM 40 MG PO TABS: 40.0000 mg | ORAL_TABLET | Freq: Every day | ORAL | 1 refills | Status: DC

## 2021-08-13 MED ORDER — VITAMIN D (ERGOCALCIFEROL) 1.25 MG (50000 UNIT) PO CAPS: 50000.0000 [IU] | ORAL_CAPSULE | ORAL | 1 refills | Status: DC

## 2021-08-13 NOTE — Patient Instructions (Signed)
Preventing Type 2 Diabetes Mellitus °Type 2 diabetes, also called type 2 diabetes mellitus, is a long-term (chronic) disease that affects sugar (glucose) levels in your blood. Normally, a hormone called insulin allows glucose to enter cells in your body. The cells use glucose for energy. With type 2 diabetes, you will have one or both of these problems: °Your pancreas does not make enough insulin. °Cells in your body do not respond properly to insulin that your body makes (insulin resistance). °Insulin resistance or lack of insulin causes extra glucose to build up in the blood instead of going into cells. As a result, high blood glucose (hyperglycemia) develops. That can cause many complications. Being overweight or obese and having an inactive (sedentary) lifestyle can increase your risk for diabetes. Type 2 diabetes can be delayed or prevented by making certain nutrition and lifestyle changes. °How can this condition affect me? °If you do not take steps to prevent diabetes, your blood glucose levels may keep increasing over time. Too much glucose in your blood for a long time can damage your blood vessels, heart, kidneys, nerves, and eyes. °Type 2 diabetes can lead to chronic health problems and complications, such as: °Heart disease. °Stroke. °Blindness. °Kidney disease. °Depression. °Poor circulation in your feet and legs. In severe cases, a foot or leg may need to be surgically removed (amputated). °What can increase my risk? °You may be more likely to develop type 2 diabetes if you: °Have type 2 diabetes in your family. °Are overweight or obese. °Have a sedentary lifestyle. °Have insulin resistance or a history of prediabetes. °Have a history of pregnancy-related (gestational) diabetes or polycystic ovary syndrome (PCOS). °What actions can I take to prevent this? °It can be difficult to recognize signs of type 2 diabetes. Taking action to prevent the disease before you develop symptoms is the best way to avoid  possible damage to your body. Making certain nutrition and lifestyle changes may prevent or delay the disease and related health problems. °Nutrition ° °Eat healthy meals and snacks regularly. Do not skip meals. Fruit or a handful of nuts is a healthy snack between meals. °Drink water throughout the day. Avoid drinks that contain added sugar, such as soda or sweetened tea. Drink enough fluid to keep your urine pale yellow. °Follow instructions from your health care provider about eating or drinking restrictions. °Limit the amount of food you eat by: °Managing how much you eat at a time (portion size). °Checking food labels for the serving sizes of food. °Using a kitchen scale to weigh amounts of food. °Sauté or steam food instead of frying it. Cook with water or broth instead of oils or butter. °Limit saturated fat and salt (sodium) in your diet. Have no more than 1 tsp (2,400 mg) of sodium a day. If you have heart disease or high blood pressure, use less than ½?¾ tsp (1,500 mg) of sodium a day. °Lifestyle ° °Lose weight if needed and as told. Your health care provider can determine how much weight loss is best for you and can help you lose weight safely. °If you are overweight or obese, you may be told to lose at least 5?7% of your body weight. °Manage blood pressure, cholesterol, and stress. Your health care provider will help determine the best treatment for you. °Do not use any products that contain nicotine or tobacco. These products include cigarettes, chewing tobacco, and vaping devices, such as e-cigarettes. If you need help quitting, ask your health care provider. °Activity ° °Do physical   activity that makes your heart beat faster and makes you sweat (moderate intensity). Do this for at least 30 minutes on at least 5 days of the week, or as much as told by your health care provider. °Ask your health care provider what activities are safe for you. A mix of activities may be best, such as walking, swimming,  cycling, and strength training. °Try to add physical activity into your day. For example: °Park your car farther away than usual so that you walk more. °Take a walk during your lunch break. °Use stairs instead of elevators or escalators. °Walk or bike to work instead of driving. °Alcohol use °If you drink alcohol: °Limit how much you have to: °0?1 drink a day for women who are not pregnant. °0?2 drinks a day for men. °Know how much alcohol is in your drink. In the [place] one drink equals one 12 oz bottle of beer (355 mL), one 5 oz glass of wine (148 mL), or one 1½ oz glass of hard liquor (44 mL). °General information °Talk with your health care provider about your risk factors and how you can reduce your risk for diabetes. °Have your blood glucose tested regularly, as told by your health care provider. °Get screening tests as told by your health care provider. You may have these regularly, especially if you have certain risk factors for type 2 diabetes. °Make an appointment with a registered dietitian. This diet and nutrition specialist can help you make a healthy eating plan and help you understand portion sizes and food labels. °Where to find support °Ask your health care provider to recommend a registered dietitian, a certified diabetes care and education specialist, or a weight loss program. °Look for local or online weight loss groups. °Join a gym, fitness club, or outdoor activity group, such as a walking club. °Where to find more information °For help and guidance and to learn more about diabetes and diabetes prevention, visit: °American Diabetes Association (ADA): www.diabetes.org °National Institute of Diabetes and Digestive and Kidney Diseases: www.niddk.nih.gov °To learn more about healthy eating, visit: °[place] Department of Agriculture ([place]DA): www.choosemyplate.gov °Office of Disease Prevention and Health Promotion (ODPHP): health.gov °Summary °You can delay or prevent type 2 diabetes by eating healthy  foods, losing weight if needed, and increasing your physical activity. °Talk with your health care provider about your risk factors for type 2 diabetes and how you can reduce your risk. °It can be difficult to recognize the signs of type 2 diabetes. The best way to avoid possible damage to your body is to take action to prevent the disease before you develop symptoms. °Get screening tests as told by your health care provider. °This information is not intended to replace advice given to you by your health care provider. Make sure you discuss any questions you have with your health care provider. °Document Revised: 01/25/2021 Document Reviewed: 01/25/2021 °Elsevier Patient Education © 2022 [employer] ° °

## 2021-08-13 NOTE — Progress Notes (Signed)
Established Patient Office Visit  Subjective:  Patient ID: Brooke Hansen, female    DOB: 1956/07/22  Age: 65 y.o. MRN: [id number]  CC:  Chief Complaint  Patient presents with   Follow-up    PreDM    Hyperlipidemia   Chronic Kidney Disease    HPI Brooke Hansen presents for follow up on hyperlipidemia, prediabetes, CKD and discuss most recent labs. Patient reports gradually worsening urinary incontinence. No back pain, lower abdominal pain or hematuria.  HLD: Pt taking medication as directed without issues. Continues with moderate-rigourous physical activity. Patient follows a low fat diet.  Prediabetes: Reports has reduced sugar intake. No increased thirst or urination.  CKD: Hx of nephrectomy. Avoids NSAIDs.  Past Medical History:  Diagnosis Date   Allergy    Anemia    Asthma    History of nephrectomy    right - nonfunctioning kidney   Hyperlipidemia    RLS (restless legs syndrome)    Transfusion of blood during current hospitalization     Past Surgical History:  Procedure Laterality Date   BREAST BIOPSY Left 05/23/2019   FIBROCYSTIC CHANGES.   CESAREAN SECTION  1986   NEPHRECTOMY Right 1975    Family History  Problem Relation Age of Onset   Alcohol abuse Other    Diabetes Other    Hyperlipidemia Other    Coronary artery disease Other    Hyperlipidemia Mother    Cancer Father        lung   Heart disease Father    Heart disease Maternal Grandfather    Colon cancer Neg Hx     Social History   Socioeconomic History   Marital status: Married    Spouse name: Not on file   Number of children: Not on file   Years of education: Not on file   Highest education level: Not on file  Occupational History   Not on file  Tobacco Use   Smoking status: Former    Packs/day: 1.00    Years: 10.00    Pack years: 10.00    Types: Cigarettes    Quit date: 11/15/1983    Years since quitting: 37.7   Smokeless tobacco: Never  Vaping Use   Vaping Use: Never used   Substance and Sexual Activity   Alcohol use: Yes    Alcohol/week: 2.0 standard drinks    Types: 2 Standard drinks or equivalent per week   Drug use: No   Sexual activity: Not Currently    Birth control/protection: None  Other Topics Concern   Not on file  Social History Narrative   Not on file   Social Determinants of Health   Financial Resource Strain: Not on file  Food Insecurity: Not on file  Transportation Needs: Not on file  Physical Activity: Not on file  Stress: Not on file  Social Connections: Not on file  Intimate Partner Violence: Not on file    Outpatient Medications Prior to Visit  Medication Sig Dispense Refill   albuterol (VENTOLIN HFA) 108 (90 Base) MCG/ACT inhaler INHALE 2 PUFFS INTO THE LUNGS EVERY 6 HOURS AS NEEDED FOR WHEEZING OR SHORTNESS OF BREATH. 18 g 1   cyanocobalamin (,VITAMIN B-12,) 1000 MCG/ML injection INJECT 1 ML INTO THE MUSCLE EVERY 30 DAYS. 3 mL 1   fluticasone (FLONASE) 50 MCG/ACT nasal spray PLACE 2 SPRAYS IN EACH NOSTRIL DAILY. 16 g 2   atorvastatin (LIPITOR) 40 MG tablet TAKE 1 TABLET BY MOUTH AT BEDTIME. 90 tablet 0  COVID-19 mRNA vaccine, Moderna, 100 MCG/0.5ML injection Inject into the muscle. 0.25 mL 0   Vitamin D, Ergocalciferol, (DRISDOL) 1.25 MG (50000 UNIT) CAPS capsule TAKE 1 CAPSULE BY MOUTH EVERY 7 DAYS. 4 capsule 0   No facility-administered medications prior to visit.    Allergies  Allergen Reactions   Septra [Sulfamethoxazole-Trimethoprim] Other (See Comments)    Per pt: unknown (as teenager)   Ampicillin Rash    ROS Review of Systems  A fourteen system review of systems was performed and found to be positive as per HPI.   Objective:    Physical Exam General:  Well Developed, well nourished, appropriate for stated age.  Neuro:  Alert and oriented,  extra-ocular muscles intact  HEENT:  Normocephalic, atraumatic, neck supple Skin:  no gross rash, warm, pink. Cardiac:  RRR, S1 S2 Respiratory:  CTA B/L w/o  wheezing, crackles, or rales, Not using accessory muscles, speaking in full sentences- unlabored. Vascular:  Ext warm, no cyanosis apprec.; cap RF less 2 sec. No gross edema  Psych:  No HI/SI, judgement and insight good, Euthymic mood. Full Affect.  BP 112/62   Pulse 86   Temp 98.4 F (36.9 C)   Ht $R'5\' 4"'hW$  (1.626 m)   Wt 135 lb 6.4 oz (61.4 kg)   SpO2 97%   BMI 23.24 kg/m  Wt Readings from Last 3 Encounters:  08/13/21 135 lb 6.4 oz (61.4 kg)  05/11/21 142 lb 12.8 oz (64.8 kg)  11/05/20 138 lb (62.6 kg)     Health Maintenance Due  Topic Date Due   Hepatitis C Screening  Never done   COVID-19 Vaccine (5 - Booster for [place] series) 08/09/2021    There are no preventive care reminders to display for this patient.  Lab Results  Component Value Date   TSH 1.930 05/04/2021   Lab Results  Component Value Date   WBC 6.7 05/04/2021   HGB 13.1 05/04/2021   HCT 41.0 05/04/2021   MCV 90 05/04/2021   PLT 300 05/04/2021   Lab Results  Component Value Date   NA 137 08/06/2021   K 4.5 08/06/2021   CO2 25 08/06/2021   GLUCOSE 95 08/06/2021   BUN 15 08/06/2021   CREATININE 0.96 08/06/2021   BILITOT 0.3 08/06/2021   ALKPHOS 89 08/06/2021   AST 28 08/06/2021   ALT 19 08/06/2021   PROT 6.9 08/06/2021   ALBUMIN 4.6 08/06/2021   CALCIUM 10.2 08/06/2021   EGFR 65 08/06/2021   GFR 60.98 03/13/2017   Lab Results  Component Value Date   CHOL 176 08/06/2021   Lab Results  Component Value Date   HDL 68 08/06/2021   Lab Results  Component Value Date   LDLCALC 95 08/06/2021   Lab Results  Component Value Date   TRIG 66 08/06/2021   Lab Results  Component Value Date   CHOLHDL 2.6 08/06/2021   Lab Results  Component Value Date   HGBA1C 6.4 (H) 08/06/2021      Assessment & Plan:   Problem List Items Addressed This Visit       Genitourinary   Stage III chronic kidney disease ([place]) - Primary     Other   Hyperlipidemia   Relevant Medications   atorvastatin  (LIPITOR) 40 MG tablet   Prediabetes   Vitamin D insufficiency   Relevant Medications   Vitamin D, Ergocalciferol, (DRISDOL) 1.25 MG (50000 UNIT) CAPS capsule   Other Visit Diagnoses     Need for influenza vaccination  Relevant Orders   Flu Vaccine QUAD High Dose(Fluad) (Completed)   History of nephrectomy       Relevant Orders   Ambulatory referral to Urology   Urinary incontinence, unspecified type       Relevant Orders   Ambulatory referral to Urology      HLD: -Discussed recent lipid panel wnl's, LDL improved from 112 to 95. -Continue atorvastatin 40 mg. Recent hepatic function normal. -Continue heart healthy diet and physical activity regimen. -Will continue to monitor.  Prediabetes: -Recent A1c 6.4, surprisingly unchanged from prior with dietary changes. Recommend to continue with low carbohydrate and glucose diet. -Continue physical activity regimen. -Will continue to monitor.  Stage III CKD: -Recent ser creatinine 0.96 an deGFR 65, improved from prior. -Will continue to monitor. -Continue to avoid NSAIDs.  Urinary incontinence: -Will place referral to urology, patient prefers [place] location.   Meds ordered this encounter  Medications   atorvastatin (LIPITOR) 40 MG tablet    Sig: Take 1 tablet (40 mg total) by mouth at bedtime.    Dispense:  90 tablet    Refill:  1    Order Specific Question:   Supervising Provider    Answer:   Beatrice Lecher D [[id number]]   Vitamin D, Ergocalciferol, (DRISDOL) 1.25 MG (50000 UNIT) CAPS capsule    Sig: Take 1 capsule (50,000 Units total) by mouth every 7 (seven) days.    Dispense:  12 capsule    Refill:  1    Order Specific Question:   Supervising Provider    Answer:   Beatrice Lecher D [[id number]]    Follow-up: Return in about 4 months (around 12/13/2021) for CKD, Prediabetes , HLD and FBW include B12, Vit D few days prior; CPE/MCW June 2023 .    Lorrene Reid, PA-C

## 2021-10-11 DIAGNOSIS — K5904 Chronic idiopathic constipation: Secondary | ICD-10-CM | POA: Diagnosis not present

## 2021-10-11 DIAGNOSIS — R3915 Urgency of urination: Secondary | ICD-10-CM | POA: Diagnosis not present

## 2021-11-01 DIAGNOSIS — R3915 Urgency of urination: Secondary | ICD-10-CM | POA: Diagnosis not present

## 2021-11-01 DIAGNOSIS — M6281 Muscle weakness (generalized): Secondary | ICD-10-CM | POA: Diagnosis not present

## 2021-11-01 DIAGNOSIS — M62838 Other muscle spasm: Secondary | ICD-10-CM | POA: Diagnosis not present

## 2021-11-01 DIAGNOSIS — M6289 Other specified disorders of muscle: Secondary | ICD-10-CM | POA: Diagnosis not present

## 2021-11-01 DIAGNOSIS — K5904 Chronic idiopathic constipation: Secondary | ICD-10-CM | POA: Diagnosis not present

## 2021-11-05 ENCOUNTER — Other Ambulatory Visit: Payer: Self-pay | Admitting: Physician Assistant

## 2021-11-05 DIAGNOSIS — H6981 Other specified disorders of Eustachian tube, right ear: Secondary | ICD-10-CM

## 2021-11-05 DIAGNOSIS — J3089 Other allergic rhinitis: Secondary | ICD-10-CM

## 2021-11-11 ENCOUNTER — Ambulatory Visit
Admission: RE | Admit: 2021-11-11 | Discharge: 2021-11-11 | Disposition: A | Discharge status: Home / Self Care | Payer: Medicare PPO | Source: Ambulatory Visit

## 2021-11-11 DIAGNOSIS — Z1231 Encounter for screening mammogram for malignant neoplasm of breast: Secondary | ICD-10-CM

## 2021-11-12 DIAGNOSIS — M6289 Other specified disorders of muscle: Secondary | ICD-10-CM | POA: Diagnosis not present

## 2021-11-12 DIAGNOSIS — M6281 Muscle weakness (generalized): Secondary | ICD-10-CM | POA: Diagnosis not present

## 2021-11-12 DIAGNOSIS — R3915 Urgency of urination: Secondary | ICD-10-CM | POA: Diagnosis not present

## 2021-11-12 DIAGNOSIS — M62838 Other muscle spasm: Secondary | ICD-10-CM | POA: Diagnosis not present

## 2021-11-20 ENCOUNTER — Other Ambulatory Visit: Payer: Self-pay | Admitting: Physician Assistant

## 2021-11-20 DIAGNOSIS — D510 Vitamin B12 deficiency anemia due to intrinsic factor deficiency: Secondary | ICD-10-CM

## 2021-11-23 ENCOUNTER — Encounter: Payer: Self-pay | Admitting: Nurse Practitioner

## 2021-11-23 ENCOUNTER — Other Ambulatory Visit: Payer: Self-pay

## 2021-11-23 ENCOUNTER — Telehealth (INDEPENDENT_AMBULATORY_CARE_PROVIDER_SITE_OTHER): Payer: Medicare PPO | Admitting: Nurse Practitioner

## 2021-11-23 VITALS — Temp 98.6°F

## 2021-11-23 DIAGNOSIS — J069 Acute upper respiratory infection, unspecified: Secondary | ICD-10-CM | POA: Diagnosis not present

## 2021-11-23 DIAGNOSIS — U071 COVID-19: Secondary | ICD-10-CM | POA: Diagnosis not present

## 2021-11-23 MED ORDER — MOLNUPIRAVIR EUA 200MG CAPSULE: 4.0000 | ORAL_CAPSULE | Freq: Two times a day (BID) | ORAL | 0 refills | Status: AC

## 2021-11-23 NOTE — Progress Notes (Signed)
Virtual Visit via Video Note  I connected with Brooke Hansen on 11/28/21 at 10:30 AM EST by a video enabled telemedicine application and verified that I am speaking with the correct person using two identifiers.  Location: Patient: home Provider: [place] [facility] at [facility]     I discussed the limitations of evaluation and management by telemedicine and the availability of in person appointments. The patient expressed understanding and agreed to proceed.  History of Present Illness: Sinus congestion and headache for past few days. Had some wheezing and increased shortness of breath. Had to use rescue inhaler a few times. Took home test for COVID 19 this morning before going to work and results were positive. She has COVID risk score of 1. She states that she only has one kidney.    Observations/Objective:  The patient is alert and oriented. She is pleasant and answers all questions appropriately. Breathing is non-labored. She is in no acute distress at this time.  She is nasally congested.   Today's Vitals   11/23/21 1043  Temp: 98.6 F (37 C)   There is no height or weight on file to calculate BMI.   Assessment and Plan: 1. Upper respiratory tract infection due to COVID-19 virus Start molnupiravir 800 mg twice daily for next 5 days.  Reviewed recommendations for isolation and quarantine per CDC. Rest and increase fluids. Continue using OTC medication to control symptoms.  Should also take OTC zinc, vitamin d, and vitamin c every day.  Recommend she seek emergency care if she felt shortness of breath or high fever.  Patient voiced understanding and agreement with all instructions. - molnupiravir EUA (LAGEVRIO) 200 mg CAPS capsule; Take 4 capsules (800 mg total) by mouth 2 (two) times daily for 5 days.  Dispense: 40 capsule; Refill: 0   Follow Up Instructions:    I discussed the assessment and treatment plan with the patient. The patient was provided an opportunity to  ask questions and all were answered. The patient agreed with the plan and demonstrated an understanding of the instructions.   The patient was advised to call back or seek an in-person evaluation if the symptoms worsen or if the condition fails to improve as anticipated.  I provided 10 minutes of non-face-to-face time during this encounter.   Ronnell Freshwater, NP

## 2021-11-23 NOTE — Patient Instructions (Signed)
COVID-19: Quarantine and Isolation °Quarantine °If you were exposed °Quarantine and stay away from others when you have been in close contact with someone who has COVID-19. °Isolate °If you are sick or test positive °Isolate when you are sick or when you have COVID-19, even if you don't have symptoms. °When to stay home °Calculating quarantine °The date of your exposure is considered day 0. Day 1 is the first full day after your last contact with a person who has had COVID-19. Stay home and away from other people for at least 5 days. Learn why CDC updated guidance for the general public. °IF YOU were exposed to COVID-19 and are NOT  °up to dateIF YOU were exposed to COVID-19 and are NOT on COVID-19 vaccinations °Quarantine for at least 5 days °Stay home °Stay home and quarantine for at least 5 full days. °Wear a well-fitting mask if you must be around others in your home. °Do not travel. °Get tested °Even if you don't develop symptoms, get tested at least 5 days after you last had close contact with someone with COVID-19. °After quarantine °Watch for symptoms °Watch for symptoms until 10 days after you last had close contact with someone with COVID-19. °Avoid travel °It is best to avoid travel until a full 10 days after you last had close contact with someone with COVID-19. °If you develop symptoms °Isolate immediately and get tested. Continue to stay home until you know the results. Wear a well-fitting mask around others. °Take precautions until day 10 °Wear a well-fitting mask °Wear a well-fitting mask for 10 full days any time you are around others inside your home or in public. Do not go to places where you are unable to wear a well-fitting mask. °If you must travel during days 6-10, take precautions. °Avoid being around people who are more likely to get very sick from COVID-19. °IF YOU were exposed to COVID-19 and are  °up to dateIF YOU were exposed to COVID-19 and are on COVID-19 vaccinations °No  quarantine °You do not need to stay home unless you develop symptoms. °Get tested °Even if you don't develop symptoms, get tested at least 5 days after you last had close contact with someone with COVID-19. °Watch for symptoms °Watch for symptoms until 10 days after you last had close contact with someone with COVID-19. °If you develop symptoms °Isolate immediately and get tested. Continue to stay home until you know the results. Wear a well-fitting mask around others. °Take precautions until day 10 °Wear a well-fitting mask °Wear a well-fitting mask for 10 full days any time you are around others inside your home or in public. Do not go to places where you are unable to wear a well-fitting mask. °Take precautions if traveling °Avoid being around people who are more likely to get very sick from COVID-19. °IF YOU were exposed to COVID-19 and had confirmed COVID-19 within the past 90 days (you tested positive using a viral test) °No quarantine °You do not need to stay home unless you develop symptoms. °Watch for symptoms °Watch for symptoms until 10 days after you last had close contact with someone with COVID-19. °If you develop symptoms °Isolate immediately and get tested. Continue to stay home until you know the results. Wear a well-fitting mask around others. °Take precautions until day 10 °Wear a well-fitting mask °Wear a well-fitting mask for 10 full days any time you are around others inside your home or in public. Do not go to places where you are   unable to wear a well-fitting mask. °Take precautions if traveling °Avoid being around people who are more likely to get very sick from COVID-19. °Calculating isolation °Day 0 is your first day of symptoms or a positive viral test. Day 1 is the first full day after your symptoms developed or your test specimen was collected. If you have COVID-19 or have symptoms, isolate for at least 5 days. °IF YOU tested positive for COVID-19 or have symptoms, regardless of  vaccination status °Stay home for at least 5 days °Stay home for 5 days and isolate from others in your home. °Wear a well-fitting mask if you must be around others in your home. °Do not travel. °Ending isolation if you had symptoms °End isolation after 5 full days if you are fever-free for 24 hours (without the use of fever-reducing medication) and your symptoms are improving. °Ending isolation if you did NOT have symptoms °End isolation after at least 5 full days after your positive test. °If you got very sick from COVID-19 or have a weakened immune system °You should isolate for at least 10 days. Consult your doctor before ending isolation. °Take precautions until day 10 °Wear a well-fitting mask °Wear a well-fitting mask for 10 full days any time you are around others inside your home or in public. Do not go to places where you are unable to wear a well-fitting mask. °Do not travel °Do not travel until a full 10 days after your symptoms started or the date your positive test was taken if you had no symptoms. °Avoid being around people who are more likely to get very sick from COVID-19. °Definitions °Exposure °Contact with someone infected with SARS-CoV-2, the virus that causes COVID-19, in a way that increases the likelihood of getting infected with the virus. °Close contact °A close contact is someone who was less than 6 feet away from an infected person (laboratory-confirmed or a clinical diagnosis) for a cumulative total of 15 minutes or more over a 24-hour period. For example, three individual 5-minute exposures for a total of 15 minutes. People who are exposed to someone with COVID-19 after they completed at least 5 days of isolation are not considered close contacts. °Quarantine °Quarantine is a strategy used to prevent transmission of COVID-19 by keeping people who have been in close contact with someone with COVID-19 apart from others. °Who does not need to quarantine? °If you had close contact with  someone with COVID-19 and you are in one of the following groups, you do not need to quarantine. °You are up to date with your COVID-19 vaccines. °You had confirmed COVID-19 within the last 90 days (meaning you tested positive using a viral test). °If you are up to date with COVID-19 vaccines, you should wear a well-fitting mask around others for 10 days from the date of your last close contact with someone with COVID-19 (the date of last close contact is considered day 0). Get tested at least 5 days after you last had close contact with someone with COVID-19. If you test positive or develop COVID-19 symptoms, isolate from other people and follow recommendations in the Isolation section below. If you tested positive for COVID-19 with a viral test within the previous 90 days and subsequently recovered and remain without COVID-19 symptoms, you do not need to quarantine or get tested after close contact. You should wear a well-fitting mask around others for 10 days from the date of your last close contact with someone with COVID-19 (the date of last   close contact is considered day 0). If you have COVID-19 symptoms, get tested and isolate from other people and follow recommendations in the Isolation section below. °Who should quarantine? °If you come into close contact with someone with COVID-19, you should quarantine if you are not up to date on COVID-19 vaccines. This includes people who are not vaccinated. °What to do for quarantine °Stay home and away from other people for at least 5 days (day 0 through day 5) after your last contact with a person who has COVID-19. The date of your exposure is considered day 0. Wear a well-fitting mask when around others at home, if possible. °For 10 days after your last close contact with someone with COVID-19, watch for fever (100.4°F or greater), cough, shortness of breath, or other COVID-19 symptoms. °If you develop symptoms, get tested immediately and isolate until you receive  your test results. If you test positive, follow isolation recommendations. °If you do not develop symptoms, get tested at least 5 days after you last had close contact with someone with COVID-19. °If you test negative, you can leave your home, but continue to wear a well-fitting mask when around others at home and in public until 10 days after your last close contact with someone with COVID-19. °If you test positive, you should isolate for at least 5 days from the date of your positive test (if you do not have symptoms). If you do develop COVID-19 symptoms, isolate for at least 5 days from the date your symptoms began (the date the symptoms started is day 0). Follow recommendations in the isolation section below. °If you are unable to get a test 5 days after last close contact with someone with COVID-19, you can leave your home after day 5 if you have been without COVID-19 symptoms throughout the 5-day period. Wear a well-fitting mask for 10 days after your date of last close contact when around others at home and in public. °Avoid people who are have weakened immune systems or are more likely to get very sick from COVID-19, and nursing homes and other high-risk settings, until after at least 10 days. °If possible, stay away from people you live with, especially people who are at higher risk for getting very sick from COVID-19, as well as others outside your home throughout the full 10 days after your last close contact with someone with COVID-19. °If you are unable to quarantine, you should wear a well-fitting mask for 10 days when around others at home and in public. °If you are unable to wear a mask when around others, you should continue to quarantine for 10 days. Avoid people who have weakened immune systems or are more likely to get very sick from COVID-19, and nursing homes and other high-risk settings, until after at least 10 days. °See additional information about travel. °Do not go to places where you are  unable to wear a mask, such as restaurants and some gyms, and avoid eating around others at home and at work until after 10 days after your last close contact with someone with COVID-19. °After quarantine °Watch for symptoms until 10 days after your last close contact with someone with COVID-19. °If you have symptoms, isolate immediately and get tested. °Quarantine in high-risk congregate settings °In certain congregate settings that have high risk of secondary transmission (such as correctional and detention facilities, homeless shelters, or cruise ships), CDC recommends a 10-day quarantine for residents, regardless of vaccination and booster status. During periods of critical staffing   shortages, facilities may consider shortening the quarantine period for staff to ensure continuity of operations. Decisions to shorten quarantine in these settings should be made in consultation with state, local, tribal, or territorial health departments and should take into consideration the context and characteristics of the facility. CDC's setting-specific guidance provides additional recommendations for these settings. °Isolation °Isolation is used to separate people with confirmed or suspected COVID-19 from those without COVID-19. People who are in isolation should stay home until it's safe for them to be around others. At home, anyone sick or infected should separate from others, or wear a well-fitting mask when they need to be around others. People in isolation should stay in a specific "sick room" or area and use a separate bathroom if available. Everyone who has presumed or confirmed COVID-19 should stay home and isolate from other people for at least 5 full days (day 0 is the first day of symptoms or the date of the day of the positive viral test for asymptomatic persons). They should wear a mask when around others at home and in public for an additional 5 days. People who are confirmed to have COVID-19 or are showing  symptoms of COVID-19 need to isolate regardless of their vaccination status. This includes: °People who have a positive viral test for COVID-19, regardless of whether or not they have symptoms. °People with symptoms of COVID-19, including people who are awaiting test results or have not been tested. People with symptoms should isolate even if they do not know if they have been in close contact with someone with COVID-19. °What to do for isolation °Monitor your symptoms. If you have an emergency warning sign (including trouble breathing), seek emergency medical care immediately. °Stay in a separate room from other household members, if possible. °Use a separate bathroom, if possible. °Take steps to improve ventilation at home, if possible. °Avoid contact with other members of the household and pets. °Don't share personal household items, like cups, towels, and utensils. °Wear a well-fitting mask when you need to be around other people. °Learn more about what to do if you are sick and how to notify your contacts. °Ending isolation for people who had COVID-19 and had symptoms °If you had COVID-19 and had symptoms, isolate for at least 5 days. To calculate your 5-day isolation period, day 0 is your first day of symptoms. Day 1 is the first full day after your symptoms developed. You can leave isolation after 5 full days. °You can end isolation after 5 full days if you are fever-free for 24 hours without the use of fever-reducing medication and your other symptoms have improved (Loss of taste and smell may persist for weeks or months after recovery and need not delay the end of isolation). °You should continue to wear a well-fitting mask around others at home and in public for 5 additional days (day 6 through day 10) after the end of your 5-day isolation period. If you are unable to wear a mask when around others, you should continue to isolate for a full 10 days. Avoid people who have weakened immune systems or are more  likely to get very sick from COVID-19, and nursing homes and other high-risk settings, until after at least 10 days. °If you continue to have fever or your other symptoms have not improved after 5 days of isolation, you should wait to end your isolation until you are fever-free for 24 hours without the use of fever-reducing medication and your other symptoms have improved.   Continue to wear a well-fitting mask through day 10. Contact your healthcare provider if you have questions. °See additional information about travel. °Do not go to places where you are unable to wear a mask, such as restaurants and some gyms, and avoid eating around others at home and at work until a full 10 days after your first day of symptoms. °If an individual has access to a test and wants to test, the best approach is to use an antigen test1 towards the end of the 5-day isolation period. Collect the test sample only if you are fever-free for 24 hours without the use of fever-reducing medication and your other symptoms have improved (loss of taste and smell may persist for weeks or months after recovery and need not delay the end of isolation). If your test result is positive, you should continue to isolate until day 10. If your test result is negative, you can end isolation, but continue to wear a well-fitting mask around others at home and in public until day 10. Follow additional recommendations for masking and avoiding travel as described above. °1As noted in the labeling for authorized over-the counter antigen tests: Negative results should be treated as presumptive. Negative results do not rule out SARS-CoV-2 infection and should not be used as the sole basis for treatment or patient management decisions, including infection control decisions. To improve results, antigen tests should be used twice over a three-day period with at least 24 hours and no more than 48 hours between tests. °Note that these recommendations on ending isolation  do not apply to people who are moderately ill or very sick from COVID-19 or have weakened immune systems. See section below for recommendations for when to end isolation for these groups. °Ending isolation for people who tested positive for COVID-19 but had no symptoms °If you test positive for COVID-19 and never develop symptoms, isolate for at least 5 days. Day 0 is the day of your positive viral test (based on the date you were tested) and day 1 is the first full day after the specimen was collected for your positive test. You can leave isolation after 5 full days. °If you continue to have no symptoms, you can end isolation after at least 5 days. °You should continue to wear a well-fitting mask around others at home and in public until day 10 (day 6 through day 10). If you are unable to wear a mask when around others, you should continue to isolate for 10 days. Avoid people who have weakened immune systems or are more likely to get very sick from COVID-19, and nursing homes and other high-risk settings, until after at least 10 days. °If you develop symptoms after testing positive, your 5-day isolation period should start over. Day 0 is your first day of symptoms. Follow the recommendations above for ending isolation for people who had COVID-19 and had symptoms. °See additional information about travel. °Do not go to places where you are unable to wear a mask, such as restaurants and some gyms, and avoid eating around others at home and at work until 10 days after the day of your positive test. °If an individual has access to a test and wants to test, the best approach is to use an antigen test1 towards the end of the 5-day isolation period. If your test result is positive, you should continue to isolate until day 10. If your test result is positive, you can also choose to test daily and if your test result   is negative, you can end isolation, but continue to wear a well-fitting mask around others at home and in  public until day 10. Follow additional recommendations for masking and avoiding travel as described above. °1As noted in the labeling for authorized over-the counter antigen tests: Negative results should be treated as presumptive. Negative results do not rule out SARS-CoV-2 infection and should not be used as the sole basis for treatment or patient management decisions, including infection control decisions. To improve results, antigen tests should be used twice over a three-day period with at least 24 hours and no more than 48 hours between tests. °Ending isolation for people who were moderately or very sick from COVID-19 or have a weakened immune system °People who are moderately ill from COVID-19 (experiencing symptoms that affect the lungs like shortness of breath or difficulty breathing) should isolate for 10 days and follow all other isolation precautions. To calculate your 10-day isolation period, day 0 is your first day of symptoms. Day 1 is the first full day after your symptoms developed. If you are unsure if your symptoms are moderate, talk to a healthcare provider for further guidance. °People who are very sick from COVID-19 (this means people who were hospitalized or required intensive care or ventilation support) and people who have weakened immune systems might need to isolate at home longer. They may also require testing with a viral test to determine when they can be around others. CDC recommends an isolation period of at least 10 and up to 20 days for people who were very sick from COVID-19 and for people with weakened immune systems. Consult with your healthcare provider about when you can resume being around other people. If you are unsure if your symptoms are severe or if you have a weakened immune system, talk to a healthcare provider for further guidance. °People who have a weakened immune system should talk to their healthcare provider about the potential for reduced immune responses to  COVID-19 vaccines and the need to continue to follow current prevention measures (including wearing a well-fitting mask and avoiding crowds and poorly ventilated indoor spaces) to protect themselves against COVID-19 until advised otherwise by their healthcare provider. Close contacts of immunocompromised people--including household members--should also be encouraged to receive all recommended COVID-19 vaccine doses to help protect these people. °Isolation in high-risk congregate settings °In certain high-risk congregate settings that have high risk of secondary transmission and where it is not feasible to cohort people (such as correctional and detention facilities, homeless shelters, and cruise ships), CDC recommends a 10-day isolation period for residents. During periods of critical staffing shortages, facilities may consider shortening the isolation period for staff to ensure continuity of operations. Decisions to shorten isolation in these settings should be made in consultation with state, local, tribal, or territorial health departments and should take into consideration the context and characteristics of the facility. CDC's setting-specific guidance provides additional recommendations for these settings. °This CDC guidance is meant to supplement--not replace--any federal, state, local, territorial, or tribal health and safety laws, rules, and regulations. °Recommendations for specific settings °These recommendations do not apply to healthcare professionals. For guidance specific to these settings, see °Healthcare professionals: Interim Guidance for Managing Healthcare Personnel with SARS-CoV-2 Infection or Exposure to SARS-CoV-2 °Patients, residents, and visitors to healthcare settings: Interim Infection Prevention and Control Recommendations for Healthcare Personnel During the Coronavirus Disease 2019 (COVID-19) Pandemic °Additional setting-specific guidance and recommendations are available. °These  recommendations on quarantine and isolation do apply to K[employer]12 School   settings. Additional guidance is available here: Overview of COVID-19 Quarantine for K-12 Schools °Travelers: Travel information and recommendations °Congregate facilities and other settings: guidance pages for community, work, and school settings °Ongoing COVID-19 exposure FAQs °I live with someone with COVID-19, but I cannot be separated from them. How do we manage quarantine in this situation? °It is very important for people with COVID-19 to remain apart from other people, if possible, even if they are living together. If separation of the person with COVID-19 from others that they live with is not possible, the other people that they live with will have ongoing exposure, meaning they will be repeatedly exposed until that person is no longer able to spread the virus to other people. In this situation, there are precautions you can take to limit the spread of COVID-19: °The person with COVID-19 and everyone they live with should wear a well-fitting mask inside the home. °If possible, one person should care for the person with COVID-19 to limit the number of people who are in close contact with the infected person. °Take steps to protect yourself and others to reduce transmission in the home: °Quarantine if you are not up to date with your COVID-19 vaccines. °Isolate if you are sick or tested positive for COVID-19, even if you don't have symptoms. °Learn more about the public health recommendations for testing, mask use and quarantine of close contacts, like yourself, who have ongoing exposure. These recommendations differ depending on your vaccination status. °What should I do if I have ongoing exposure to COVID-19 from someone I live with? °Recommendations for this situation depend on your vaccination status: °If you are not up to date on COVID-19 vaccines and have ongoing exposure to COVID-19, you should: °Begin quarantine immediately and  continue to quarantine throughout the isolation period of the person with COVID-19. °Continue to quarantine for an additional 5 days starting the day after the end of isolation for the person with COVID-19. °Get tested at least 5 days after the end of isolation of the infected person that lives with them. °If you test negative, you can leave the home but should continue to wear a well-fitting mask when around others at home and in public until 10 days after the end of isolation for the person with COVID-19. °Isolate immediately if you develop symptoms of COVID-19 or test positive. °If you are up to date with COVID-19 vaccines and have ongoing exposure to COVID-19, you should: °Get tested at least 5 days after your first exposure. A person with COVID-19 is considered infectious starting 2 days before they develop symptoms, or 2 days before the date of their positive test if they do not have symptoms. °Get tested again at least 5 days after the end of isolation for the person with COVID-19. °Wear a well-fitting mask when you are around the person with COVID-19, and do this throughout their isolation period. °Wear a well-fitting mask around others for 10 days after the infected person's isolation period ends. °Isolate immediately if you develop symptoms of COVID-19 or test positive. °What should I do if multiple people I live with test positive for COVID-19 at different times? °Recommendations for this situation depend on your vaccination status: °If you are not up to date with your COVID-19 vaccines, you should: °Quarantine throughout the isolation period of any infected person that you live with. °Continue to quarantine until 5 days after the end of isolation date for the most recently infected person that lives with you. For example, if   the last day of isolation of the person most recently infected with COVID-19 was June 30, the new 5-day quarantine period starts on July 1. °Get tested at least 5 days after the end  of isolation for the most recently infected person that lives with you. °Wear a well-fitting mask when you are around any person with COVID-19 while that person is in isolation. °Wear a well-fitting mask when you are around other people until 10 days after your last close contact. °Isolate immediately if you develop symptoms of COVID-19 or test positive. °If you are up to date with your COVID-19 vaccines, you should: °Get tested at least 5 days after your first exposure. A person with COVID-19 is considered infectious starting 2 days before they developed symptoms, or 2 days before the date of their positive test if they do not have symptoms. °Get tested again at least 5 days after the end of isolation for the most recently infected person that lives with you. °Wear a well-fitting mask when you are around any person with COVID-19 while that person is in isolation. °Wear a well-fitting mask around others for 10 days after the end of isolation for the most recently infected person that lives with you. For example, if the last day of isolation for the person most recently infected with COVID-19 was June 30, the new 10-day period to wear a well-fitting mask indoors in public starts on July 1. °Isolate immediately if you develop symptoms of COVID-19 or test positive. °I had COVID-19 and completed isolation. Do I have to quarantine or get tested if someone I live with gets COVID-19 shortly after I completed isolation? °No. If you recently completed isolation and someone that lives with you tests positive for the virus that causes COVID-19 shortly after the end of your isolation period, you do not have to quarantine or get tested as long as you do not develop new symptoms. Once all of the people that live together have completed isolation or quarantine, refer to the guidance below for new exposures to COVID-19. °If you had COVID-19 in the previous 90 days and then came into close contact with someone with COVID-19, you do  not have to quarantine or get tested if you do not have symptoms. But you should: °Wear a well-fitting mask indoors in public for 10 days after your last close contact. °Monitor for COVID-19 symptoms for 10 days from the date of your last close contact. °Isolate immediately and get tested if symptoms develop. °If more than 90 days have passed since your recovery from infection, follow CDC's recommendations for close contacts. These recommendations will differ depending on your vaccination status. °02/10/2021 °Content source: National Center for Immunization and Respiratory Diseases (NCIRD), Division of Viral Diseases °This information is not intended to replace advice given to you by your health care provider. Make sure you discuss any questions you have with your health care provider. °Document Revised: 06/16/2021 Document Reviewed: 06/16/2021 °Elsevier Patient Education © 2022 Else[employer] Inc. ° °

## 2021-11-28 DIAGNOSIS — U071 COVID-19: Secondary | ICD-10-CM | POA: Insufficient documentation

## 2021-11-28 DIAGNOSIS — J069 Acute upper respiratory infection, unspecified: Secondary | ICD-10-CM | POA: Insufficient documentation

## 2021-11-29 ENCOUNTER — Telehealth: Payer: Self-pay | Admitting: Physician Assistant

## 2021-11-29 NOTE — Telephone Encounter (Signed)
Patient called asking why her vitamin b12 was denied? Please advise. [phone]

## 2021-11-29 NOTE — Telephone Encounter (Signed)
Scheduled for January 27th for a lab visit

## 2021-12-06 DIAGNOSIS — M62838 Other muscle spasm: Secondary | ICD-10-CM | POA: Diagnosis not present

## 2021-12-06 DIAGNOSIS — R3915 Urgency of urination: Secondary | ICD-10-CM | POA: Diagnosis not present

## 2021-12-06 DIAGNOSIS — M6281 Muscle weakness (generalized): Secondary | ICD-10-CM | POA: Diagnosis not present

## 2021-12-06 DIAGNOSIS — M6289 Other specified disorders of muscle: Secondary | ICD-10-CM | POA: Diagnosis not present

## 2021-12-08 ENCOUNTER — Other Ambulatory Visit: Payer: Medicare PPO

## 2021-12-09 ENCOUNTER — Other Ambulatory Visit: Payer: Self-pay

## 2021-12-09 DIAGNOSIS — E559 Vitamin D deficiency, unspecified: Secondary | ICD-10-CM

## 2021-12-09 DIAGNOSIS — E538 Deficiency of other specified B group vitamins: Secondary | ICD-10-CM

## 2021-12-09 DIAGNOSIS — Z0000 Encounter for general adult medical examination without abnormal findings: Secondary | ICD-10-CM

## 2021-12-10 ENCOUNTER — Other Ambulatory Visit: Payer: Medicare PPO

## 2021-12-10 ENCOUNTER — Other Ambulatory Visit: Payer: Self-pay

## 2021-12-10 DIAGNOSIS — E559 Vitamin D deficiency, unspecified: Secondary | ICD-10-CM

## 2021-12-10 DIAGNOSIS — E538 Deficiency of other specified B group vitamins: Secondary | ICD-10-CM

## 2021-12-10 DIAGNOSIS — Z0000 Encounter for general adult medical examination without abnormal findings: Secondary | ICD-10-CM

## 2021-12-10 DIAGNOSIS — D510 Vitamin B12 deficiency anemia due to intrinsic factor deficiency: Secondary | ICD-10-CM

## 2021-12-10 MED ORDER — CYANOCOBALAMIN 1000 MCG/ML IJ SOLN: INTRAMUSCULAR | 1 refills | Status: DC

## 2021-12-11 LAB — COMPREHENSIVE METABOLIC PANEL
ALT: 21 IU/L (ref 0–32)
AST: 26 IU/L (ref 0–40)
Albumin/Globulin Ratio: 2.1 (ref 1.2–2.2)
Albumin: 4.6 g/dL (ref 3.8–4.8)
Alkaline Phosphatase: 101 IU/L (ref 44–121)
BUN/Creatinine Ratio: 27 (ref 12–28)
BUN: 25 mg/dL (ref 8–27)
Bilirubin Total: 0.3 mg/dL (ref 0.0–1.2)
CO2: 25 mmol/L (ref 20–29)
Calcium: 10.1 mg/dL (ref 8.7–10.3)
Chloride: 102 mmol/L (ref 96–106)
Creatinine, Ser: 0.93 mg/dL (ref 0.57–1.00)
Globulin, Total: 2.2 g/dL (ref 1.5–4.5)
Glucose: 91 mg/dL (ref 70–99)
Potassium: 4.8 mmol/L (ref 3.5–5.2)
Sodium: 141 mmol/L (ref 134–144)
Total Protein: 6.8 g/dL (ref 6.0–8.5)
eGFR: 67 mL/min/{1.73_m2} (ref >59)

## 2021-12-11 LAB — CBC
Hematocrit: 41.2 % (ref 34.0–46.6)
Hemoglobin: 13.3 g/dL (ref 11.1–15.9)
MCH: 29.4 pg (ref 26.6–33.0)
MCHC: 32.3 g/dL (ref 31.5–35.7)
MCV: 91 fL (ref 79–97)
Platelets: 292 10*3/uL (ref 150–450)
RBC: 4.53 x10E6/uL (ref 3.77–5.28)
RDW: 13.1 % (ref 11.7–15.4)
WBC: 5.8 10*3/uL (ref 3.4–10.8)

## 2021-12-11 LAB — LIPID PANEL
Chol/HDL Ratio: 2.4 ratio (ref 0.0–4.4)
Cholesterol, Total: 173 mg/dL (ref 100–199)
HDL: 72 mg/dL (ref >39)
LDL Chol Calc (NIH): 93 mg/dL (ref 0–99)
Triglycerides: 37 mg/dL (ref 0–149)
VLDL Cholesterol Cal: 8 mg/dL (ref 5–40)

## 2021-12-11 LAB — HEMOGLOBIN A1C
Est. average glucose Bld gHb Est-mCnc: 131 mg/dL
Hgb A1c MFr Bld: 6.2 % — ABNORMAL HIGH (ref 4.8–5.6)

## 2021-12-11 LAB — VITAMIN B12: Vitamin B-12: 517 pg/mL (ref 232–1245)

## 2021-12-11 LAB — VITAMIN D 25 HYDROXY (VIT D DEFICIENCY, FRACTURES): Vit D, 25-Hydroxy: 73.3 ng/mL (ref 30.0–100.0)

## 2021-12-11 LAB — TSH: TSH: 1.93 u[IU]/mL (ref 0.450–4.500)

## 2021-12-13 ENCOUNTER — Ambulatory Visit
Admission: RE | Admit: 2021-12-13 | Discharge: 2021-12-13 | Disposition: A | Discharge status: Home / Self Care | Payer: Medicare PPO | Source: Ambulatory Visit | Attending: Physician Assistant | Admitting: Physician Assistant

## 2021-12-13 ENCOUNTER — Ambulatory Visit: Payer: Medicare PPO | Admitting: Physician Assistant

## 2021-12-13 DIAGNOSIS — E2839 Other primary ovarian failure: Secondary | ICD-10-CM

## 2021-12-13 DIAGNOSIS — Z780 Asymptomatic menopausal state: Secondary | ICD-10-CM | POA: Diagnosis not present

## 2021-12-13 DIAGNOSIS — M8589 Other specified disorders of bone density and structure, multiple sites: Secondary | ICD-10-CM | POA: Diagnosis not present

## 2021-12-13 NOTE — Progress Notes (Signed)
Please let the patient know that her lbas looked good. Her HgbA1c was improved from prior check at 6.2. all other labs looked great. Thanks so much.   -HB

## 2022-01-10 DIAGNOSIS — R3982 Chronic bladder pain: Secondary | ICD-10-CM | POA: Diagnosis not present

## 2022-01-10 DIAGNOSIS — R3915 Urgency of urination: Secondary | ICD-10-CM | POA: Diagnosis not present

## 2022-01-14 ENCOUNTER — Ambulatory Visit: Payer: Medicare PPO | Admitting: Physician Assistant

## 2022-02-02 ENCOUNTER — Other Ambulatory Visit: Payer: Self-pay | Admitting: Physician Assistant

## 2022-02-02 DIAGNOSIS — E559 Vitamin D deficiency, unspecified: Secondary | ICD-10-CM

## 2022-02-04 ENCOUNTER — Telehealth: Payer: Self-pay | Admitting: Physician Assistant

## 2022-02-04 DIAGNOSIS — H2513 Age-related nuclear cataract, bilateral: Secondary | ICD-10-CM | POA: Diagnosis not present

## 2022-02-04 DIAGNOSIS — H5213 Myopia, bilateral: Secondary | ICD-10-CM | POA: Diagnosis not present

## 2022-02-04 DIAGNOSIS — H353131 Nonexudative age-related macular degeneration, bilateral, early dry stage: Secondary | ICD-10-CM | POA: Diagnosis not present

## 2022-02-04 NOTE — Telephone Encounter (Signed)
Patient is requesting refill of her vitamin D. Please advise.  ?

## 2022-02-04 NOTE — Telephone Encounter (Signed)
Called the patient and left a voicemail letting her know that her vitamin d levels were in the normal ranges and do not warrant a refill of the 50,000 units rx. Recommended she take otc vitamin d 2000 units daily.  ?

## 2022-03-14 DIAGNOSIS — M79671 Pain in right foot: Secondary | ICD-10-CM | POA: Diagnosis not present

## 2022-03-14 DIAGNOSIS — L918 Other hypertrophic disorders of the skin: Secondary | ICD-10-CM | POA: Diagnosis not present

## 2022-03-14 DIAGNOSIS — L814 Other melanin hyperpigmentation: Secondary | ICD-10-CM | POA: Diagnosis not present

## 2022-03-14 DIAGNOSIS — D2122 Benign neoplasm of connective and other soft tissue of left lower limb, including hip: Secondary | ICD-10-CM | POA: Diagnosis not present

## 2022-03-14 DIAGNOSIS — L578 Other skin changes due to chronic exposure to nonionizing radiation: Secondary | ICD-10-CM | POA: Diagnosis not present

## 2022-03-14 DIAGNOSIS — D225 Melanocytic nevi of trunk: Secondary | ICD-10-CM | POA: Diagnosis not present

## 2022-03-14 DIAGNOSIS — Z411 Encounter for cosmetic surgery: Secondary | ICD-10-CM | POA: Diagnosis not present

## 2022-03-14 DIAGNOSIS — D2121 Benign neoplasm of connective and other soft tissue of right lower limb, including hip: Secondary | ICD-10-CM | POA: Diagnosis not present

## 2022-03-14 DIAGNOSIS — L821 Other seborrheic keratosis: Secondary | ICD-10-CM | POA: Diagnosis not present

## 2022-04-04 DIAGNOSIS — R3915 Urgency of urination: Secondary | ICD-10-CM | POA: Diagnosis not present

## 2022-04-26 ENCOUNTER — Other Ambulatory Visit: Payer: Self-pay

## 2022-04-26 DIAGNOSIS — E785 Hyperlipidemia, unspecified: Secondary | ICD-10-CM

## 2022-04-26 MED ORDER — ATORVASTATIN CALCIUM 40 MG PO TABS: 40.0000 mg | ORAL_TABLET | Freq: Every day | ORAL | 0 refills | Status: DC

## 2022-05-09 ENCOUNTER — Encounter: Payer: Medicare PPO | Admitting: Physician Assistant

## 2022-05-19 ENCOUNTER — Other Ambulatory Visit: Payer: Self-pay | Admitting: Physician Assistant

## 2022-05-19 DIAGNOSIS — N1831 Chronic kidney disease, stage 3a: Secondary | ICD-10-CM

## 2022-05-19 DIAGNOSIS — E559 Vitamin D deficiency, unspecified: Secondary | ICD-10-CM

## 2022-05-19 DIAGNOSIS — R7303 Prediabetes: Secondary | ICD-10-CM

## 2022-05-19 DIAGNOSIS — E538 Deficiency of other specified B group vitamins: Secondary | ICD-10-CM

## 2022-05-19 DIAGNOSIS — Z0000 Encounter for general adult medical examination without abnormal findings: Secondary | ICD-10-CM

## 2022-05-19 DIAGNOSIS — E785 Hyperlipidemia, unspecified: Secondary | ICD-10-CM

## 2022-05-19 DIAGNOSIS — D510 Vitamin B12 deficiency anemia due to intrinsic factor deficiency: Secondary | ICD-10-CM

## 2022-05-27 ENCOUNTER — Other Ambulatory Visit: Payer: Medicare PPO

## 2022-05-27 DIAGNOSIS — E559 Vitamin D deficiency, unspecified: Secondary | ICD-10-CM | POA: Diagnosis not present

## 2022-05-27 DIAGNOSIS — R7303 Prediabetes: Secondary | ICD-10-CM | POA: Diagnosis not present

## 2022-05-27 DIAGNOSIS — E538 Deficiency of other specified B group vitamins: Secondary | ICD-10-CM | POA: Diagnosis not present

## 2022-05-27 DIAGNOSIS — E785 Hyperlipidemia, unspecified: Secondary | ICD-10-CM

## 2022-05-27 DIAGNOSIS — D510 Vitamin B12 deficiency anemia due to intrinsic factor deficiency: Secondary | ICD-10-CM | POA: Diagnosis not present

## 2022-05-27 DIAGNOSIS — N1831 Chronic kidney disease, stage 3a: Secondary | ICD-10-CM | POA: Diagnosis not present

## 2022-05-27 DIAGNOSIS — Z0000 Encounter for general adult medical examination without abnormal findings: Secondary | ICD-10-CM

## 2022-05-28 ENCOUNTER — Other Ambulatory Visit: Payer: Self-pay | Admitting: Physician Assistant

## 2022-05-28 DIAGNOSIS — D510 Vitamin B12 deficiency anemia due to intrinsic factor deficiency: Secondary | ICD-10-CM

## 2022-05-28 LAB — LIPID PANEL
Chol/HDL Ratio: 2.5 ratio (ref 0.0–4.4)
Cholesterol, Total: 180 mg/dL (ref 100–199)
HDL: 72 mg/dL (ref >39)
LDL Chol Calc (NIH): 97 mg/dL (ref 0–99)
Triglycerides: 58 mg/dL (ref 0–149)
VLDL Cholesterol Cal: 11 mg/dL (ref 5–40)

## 2022-05-28 LAB — CBC
Hematocrit: 42.6 % (ref 34.0–46.6)
Hemoglobin: 13.3 g/dL (ref 11.1–15.9)
MCH: 29.3 pg (ref 26.6–33.0)
MCHC: 31.2 g/dL — ABNORMAL LOW (ref 31.5–35.7)
MCV: 94 fL (ref 79–97)
Platelets: 243 10*3/uL (ref 150–450)
RBC: 4.54 x10E6/uL (ref 3.77–5.28)
RDW: 13.1 % (ref 11.7–15.4)
WBC: 4.4 10*3/uL (ref 3.4–10.8)

## 2022-05-28 LAB — COMPREHENSIVE METABOLIC PANEL
ALT: 21 IU/L (ref 0–32)
AST: 32 IU/L (ref 0–40)
Albumin/Globulin Ratio: 2 (ref 1.2–2.2)
Albumin: 4.7 g/dL (ref 3.9–4.9)
Alkaline Phosphatase: 89 IU/L (ref 44–121)
BUN/Creatinine Ratio: 20 (ref 12–28)
BUN: 20 mg/dL (ref 8–27)
Bilirubin Total: 0.3 mg/dL (ref 0.0–1.2)
CO2: 24 mmol/L (ref 20–29)
Calcium: 10.1 mg/dL (ref 8.7–10.3)
Chloride: 101 mmol/L (ref 96–106)
Creatinine, Ser: 0.99 mg/dL (ref 0.57–1.00)
Globulin, Total: 2.3 g/dL (ref 1.5–4.5)
Glucose: 92 mg/dL (ref 70–99)
Potassium: 4.6 mmol/L (ref 3.5–5.2)
Sodium: 140 mmol/L (ref 134–144)
Total Protein: 7 g/dL (ref 6.0–8.5)
eGFR: 62 mL/min/{1.73_m2} (ref >59)

## 2022-05-28 LAB — VITAMIN D 25 HYDROXY (VIT D DEFICIENCY, FRACTURES): Vit D, 25-Hydroxy: 43.8 ng/mL (ref 30.0–100.0)

## 2022-05-28 LAB — HEMOGLOBIN A1C
Est. average glucose Bld gHb Est-mCnc: 128 mg/dL
Hgb A1c MFr Bld: 6.1 % — ABNORMAL HIGH (ref 4.8–5.6)

## 2022-05-28 LAB — TSH: TSH: 2.51 u[IU]/mL (ref 0.450–4.500)

## 2022-05-28 LAB — VITAMIN B12: Vitamin B-12: 407 pg/mL (ref 232–1245)

## 2022-06-01 NOTE — Patient Instructions (Signed)
Preventive Care 65 Years and Older, Female Preventive care refers to lifestyle choices and visits with your health care provider that can promote health and wellness. Preventive care visits are also called wellness exams. What can I expect for my preventive care visit? Counseling Your health care provider may ask you questions about your: Medical history, including: Past medical problems. Family medical history. Pregnancy and menstrual history. History of falls. Current health, including: Memory and ability to understand (cognition). Emotional well-being. Home life and relationship well-being. Sexual activity and sexual health. Lifestyle, including: Alcohol, nicotine or tobacco, and drug use. Access to firearms. Diet, exercise, and sleep habits. Work and work environment. Sunscreen use. Safety issues such as seatbelt and bike helmet use. Physical exam Your health care provider will check your: Height and weight. These may be used to calculate your BMI (body mass index). BMI is a measurement that tells if you are at a healthy weight. Waist circumference. This measures the distance around your waistline. This measurement also tells if you are at a healthy weight and may help predict your risk of certain diseases, such as type 2 diabetes and high blood pressure. Heart rate and blood pressure. Body temperature. Skin for abnormal spots. What immunizations do I need?  Vaccines are usually given at various ages, according to a schedule. Your health care provider will recommend vaccines for you based on your age, medical history, and lifestyle or other factors, such as travel or where you work. What tests do I need? Screening Your health care provider may recommend screening tests for certain conditions. This may include: Lipid and cholesterol levels. Hepatitis C test. Hepatitis B test. HIV (human immunodeficiency virus) test. STI (sexually transmitted infection) testing, if you are at  risk. Lung cancer screening. Colorectal cancer screening. Diabetes screening. This is done by checking your blood sugar (glucose) after you have not eaten for a while (fasting). Mammogram. Talk with your health care provider about how often you should have regular mammograms. BRCA-related cancer screening. This may be done if you have a family history of breast, ovarian, tubal, or peritoneal cancers. Bone density scan. This is done to screen for osteoporosis. Talk with your health care provider about your test results, treatment options, and if necessary, the need for more tests. Follow these instructions at home: Eating and drinking  Eat a diet that includes fresh fruits and vegetables, whole grains, lean protein, and low-fat dairy products. Limit your intake of foods with high amounts of sugar, saturated fats, and salt. Take vitamin and mineral supplements as recommended by your health care provider. Do not drink alcohol if your health care provider tells you not to drink. If you drink alcohol: Limit how much you have to 0-1 drink a day. Know how much alcohol is in your drink. In the [place] one drink equals one 12 oz bottle of beer (355 mL), one 5 oz glass of wine (148 mL), or one 1 oz glass of hard liquor (44 mL). Lifestyle Brush your teeth every morning and night with fluoride toothpaste. Floss one time each day. Exercise for at least 30 minutes 5 or more days each week. Do not use any products that contain nicotine or tobacco. These products include cigarettes, chewing tobacco, and vaping devices, such as e-cigarettes. If you need help quitting, ask your health care provider. Do not use drugs. If you are sexually active, practice safe sex. Use a condom or other form of protection in order to prevent STIs. Take aspirin only as told by   your health care provider. Make sure that you understand how much to take and what form to take. Work with your health care provider to find out whether it  is safe and beneficial for you to take aspirin daily. Ask your health care provider if you need to take a cholesterol-lowering medicine (statin). Find healthy ways to manage stress, such as: Meditation, yoga, or listening to music. Journaling. Talking to a trusted person. Spending time with friends and family. Minimize exposure to UV radiation to reduce your risk of skin cancer. Safety Always wear your seat belt while driving or riding in a vehicle. Do not drive: If you have been drinking alcohol. Do not ride with someone who has been drinking. When you are tired or distracted. While texting. If you have been using any mind-altering substances or drugs. Wear a helmet and other protective equipment during sports activities. If you have firearms in your house, make sure you follow all gun safety procedures. What's next? Visit your health care provider once a year for an annual wellness visit. Ask your health care provider how often you should have your eyes and teeth checked. Stay up to date on all vaccines. This information is not intended to replace advice given to you by your health care provider. Make sure you discuss any questions you have with your health care provider. Document Revised: 04/28/2021 Document Reviewed: 04/28/2021 Elsevier Patient Education  2023 Elsevier Inc.  

## 2022-06-02 ENCOUNTER — Encounter: Payer: Self-pay | Admitting: Physician Assistant

## 2022-06-02 ENCOUNTER — Ambulatory Visit (INDEPENDENT_AMBULATORY_CARE_PROVIDER_SITE_OTHER): Payer: Medicare PPO | Admitting: Physician Assistant

## 2022-06-02 VITALS — BP 98/63 | HR 87 | Temp 97.7°F | Ht 64.0 in | Wt 138.0 lb

## 2022-06-02 DIAGNOSIS — Z0000 Encounter for general adult medical examination without abnormal findings: Secondary | ICD-10-CM

## 2022-06-02 DIAGNOSIS — E538 Deficiency of other specified B group vitamins: Secondary | ICD-10-CM | POA: Diagnosis not present

## 2022-06-02 DIAGNOSIS — D510 Vitamin B12 deficiency anemia due to intrinsic factor deficiency: Secondary | ICD-10-CM | POA: Diagnosis not present

## 2022-06-02 DIAGNOSIS — E559 Vitamin D deficiency, unspecified: Secondary | ICD-10-CM

## 2022-06-02 DIAGNOSIS — E785 Hyperlipidemia, unspecified: Secondary | ICD-10-CM | POA: Diagnosis not present

## 2022-06-02 NOTE — Progress Notes (Signed)
Complete physical exam   Patient: Brooke Hansen   DOB: 1955/12/19   66 y.o. Female  MRN: [id number] Visit Date: 06/02/2022   Chief Complaint  Patient presents with   Annual Exam   Subjective    Brooke Hansen is a 66 y.o. female who presents today for a complete physical exam.  She reports consuming a low fat diet.  Yoga.  She generally feels well. She does not have additional problems to discuss today.     Past Medical History:  Diagnosis Date   Allergy    Anemia    Asthma    History of nephrectomy    right - nonfunctioning kidney   Hyperlipidemia    RLS (restless legs syndrome)    Transfusion of blood during current hospitalization    Past Surgical History:  Procedure Laterality Date   BREAST BIOPSY Left 05/23/2019   FIBROCYSTIC CHANGES.   CESAREAN SECTION  1986   NEPHRECTOMY Right 1975   Social History   Socioeconomic History   Marital status: Married    Spouse name: Not on file   Number of children: Not on file   Years of education: Not on file   Highest education level: Not on file  Occupational History   Not on file  Tobacco Use   Smoking status: Former    Packs/day: 1.00    Years: 10.00    Total pack years: 10.00    Types: Cigarettes    Quit date: 11/15/1983    Years since quitting: 38.5   Smokeless tobacco: Never  Vaping Use   Vaping Use: Never used  Substance and Sexual Activity   Alcohol use: Yes    Alcohol/week: 2.0 standard drinks of alcohol    Types: 2 Standard drinks or equivalent per week   Drug use: No   Sexual activity: Not Currently    Birth control/protection: None  Other Topics Concern   Not on file  Social History Narrative   Not on file   Social Determinants of Health   Financial Resource Strain: Not on file  Food Insecurity: Not on file  Transportation Needs: Not on file  Physical Activity: Not on file  Stress: Not on file  Social Connections: Not on file  Intimate Partner Violence: Not on file      Medications: Outpatient Medications Prior to Visit  Medication Sig   albuterol (VENTOLIN HFA) 108 (90 Base) MCG/ACT inhaler INHALE 2 PUFFS INTO THE LUNGS EVERY 6 HOURS AS NEEDED FOR WHEEZING OR SHORTNESS OF BREATH.   atorvastatin (LIPITOR) 40 MG tablet Take 1 tablet (40 mg total) by mouth at bedtime.   cyanocobalamin (,VITAMIN B-12,) 1000 MCG/ML injection INJECT 1 ML INTO THE MUSCLE EVERY 30 DAYS.   fluticasone (FLONASE) 50 MCG/ACT nasal spray PLACE 2 SPRAYS IN EACH NOSTRIL DAILY.   MYRBETRIQ 50 MG TB24 tablet Take 50 mg by mouth daily.   [DISCONTINUED] Vitamin D, Ergocalciferol, (DRISDOL) 1.25 MG (50000 UNIT) CAPS capsule Take 1 capsule (50,000 Units total) by mouth every 7 (seven) days.   No facility-administered medications prior to visit.    Review of Systems Review of Systems:  A fourteen system review of systems was performed and found to be positive as per HPI.  Last CBC Lab Results  Component Value Date   WBC 4.4 05/27/2022   HGB 13.3 05/27/2022   HCT 42.6 05/27/2022   MCV 94 05/27/2022   MCH 29.3 05/27/2022   RDW 13.1 05/27/2022   PLT 243 05/27/2022  Last metabolic panel Lab Results  Component Value Date   GLUCOSE 92 05/27/2022   NA 140 05/27/2022   K 4.6 05/27/2022   CL 101 05/27/2022   CO2 24 05/27/2022   BUN 20 05/27/2022   CREATININE 0.99 05/27/2022   EGFR 62 05/27/2022   CALCIUM 10.1 05/27/2022   PROT 7.0 05/27/2022   ALBUMIN 4.7 05/27/2022   LABGLOB 2.3 05/27/2022   AGRATIO 2.0 05/27/2022   BILITOT 0.3 05/27/2022   ALKPHOS 89 05/27/2022   AST 32 05/27/2022   ALT 21 05/27/2022   Last lipids Lab Results  Component Value Date   CHOL 180 05/27/2022   HDL 72 05/27/2022   LDLCALC 97 05/27/2022   LDLDIRECT 119.2 01/15/2007   TRIG 58 05/27/2022   CHOLHDL 2.5 05/27/2022   Last hemoglobin A1c Lab Results  Component Value Date   HGBA1C 6.1 (H) 05/27/2022   Last thyroid functions Lab Results  Component Value Date   TSH 2.510 05/27/2022     Objective     BP 98/63   Pulse 87   Temp 97.7 F (36.5 C)   Ht 5' 4" (1.626 m)   Wt 138 lb (62.6 kg)   SpO2 96%   BMI 23.69 kg/m  BP Readings from Last 3 Encounters:  06/02/22 98/63  08/13/21 112/62  05/11/21 100/64   Wt Readings from Last 3 Encounters:  06/02/22 138 lb (62.6 kg)  08/13/21 135 lb 6.4 oz (61.4 kg)  05/11/21 142 lb 12.8 oz (64.8 kg)    Physical Exam   General Appearance:    Well developed, well nourished female. Alert, cooperative, in no acute distress, appears stated age   Head:    Normocephalic, without obvious abnormality, atraumatic  Eyes:    PERRL, conjunctiva/corneas clear, EOM's intact, fundi    benign, both eyes  Ears:    Normal TM's and external ear canals, both ears  Nose:   Nares normal, septum midline, mucosa normal, no drainage    or sinus tenderness  Throat:   Lips, mucosa, and tongue normal; teeth and gums normal  Neck:   Supple, symmetrical, trachea midline, no adenopathy;    thyroid:  no enlargement/tenderness/nodules; no JVD  Back:     Symmetric, no curvature, ROM normal, no CVA tenderness  Lungs:     Clear to auscultation bilaterally, respirations unlabored  Chest Wall:    No tenderness or deformity   Heart:    Normal heart rate. Normal rhythm. No murmurs, rubs, or gallops.   Breast Exam:    deferred  Abdomen:     Soft, non-tender, bowel sounds active all four quadrants,    no masses, no organomegaly  Pelvic:    not indicated; post-menopausal, no abnormal Pap smears in past  Extremities:   All extremities are intact. No cyanosis or edema  Pulses:   2+ and symmetric all extremities  Skin:   Skin color, texture, turgor normal, no rashes or lesions  Lymph nodes:   Cervical and supraclavicular nodes normal  Neurologic:   CNII-XII grossly intact.     Last depression screening scores    06/02/2022   10:29 AM 11/23/2021   10:44 AM 08/13/2021    8:23 AM  PHQ 2/9 Scores  PHQ - 2 Score 0 0 0  PHQ- 9 Score 0  0   Last fall risk  screening    06/02/2022   10:28 AM  Fall Risk   Falls in the past year? 1  Number falls in past yr: 0  Injury with Fall? 1  Risk for fall due to : History of fall(s)  Follow up Falls evaluation completed     No results found for any visits on 06/02/22.  Assessment & Plan    Routine Health Maintenance and Physical Exam  Exercise Activities and Dietary recommendations -Continue heart healthy diet low in fat and carbohydrates. Continue moderate exercise 150 mins/wk.  Immunization History  Administered Date(s) Administered   Fluad Quad(high Dose 65+) 08/13/2021   Influenza Split 08/21/2013   Influenza Whole 11/14/2006   Influenza, High Dose Seasonal PF 08/17/2019   Influenza-Unspecified 09/08/2014, 08/15/2015, 08/28/2020   Moderna SARS-COV2 Booster Vaccination 04/08/2021   PFIZER(Purple Top)SARS-COV-2 Vaccination 12/05/2019, 12/26/2019, 08/23/2020   PNEUMOCOCCAL CONJUGATE-20 05/13/2021   PPD Test 06/21/2013   Pneumococcal Polysaccharide-23 11/04/2019   Td 11/14/2002   Tdap 09/09/2013   Zoster Recombinat (Shingrix) 03/20/2017, 05/29/2017   Zoster, Live 12/07/2015    Health Maintenance  Topic Date Due   Hepatitis C Screening  Never done   COVID-19 Vaccine (4 - Booster for Pfizer series) 06/03/2021   INFLUENZA VACCINE  06/14/2022   TETANUS/TDAP  09/10/2023   MAMMOGRAM  11/12/2023   COLONOSCOPY (Pts 45-62yr Insurance coverage will need to be confirmed)  02/09/2025   Pneumonia Vaccine 68 Years old  Completed   DEXA SCAN  Completed   Zoster Vaccines- Shingrix  Completed   HPV VACCINES  Aged Out    Discussed health benefits of physical activity, and encouraged her to engage in regular exercise appropriate for her age and condition.  Problem List Items Addressed This Visit       Other   Hyperlipidemia   Pernicious anemia   Vitamin D insufficiency   Other Visit Diagnoses     Healthcare maintenance    -  Primary   B12 deficiency          Discussed with patient  most recent lab results which are essentially within normal limits or stable from prior. Recommend to continue current medication regimen. UTD mammogram, Tdap, bone density and colonoscopy. Completed Shingrix and Pneumonia vaccines. Continue to follow-up with Urology. Follow-up in 1 year for CPE and FBW or sooner if needed.  Return in about 1 year (around 06/03/2023) for CPE and FBW.       MLorrene Reid PA-C  [phone](phone) [phone](fax)  [facility]

## 2022-06-16 DIAGNOSIS — R350 Frequency of micturition: Secondary | ICD-10-CM | POA: Diagnosis not present

## 2022-06-16 DIAGNOSIS — M6289 Other specified disorders of muscle: Secondary | ICD-10-CM | POA: Diagnosis not present

## 2022-06-16 DIAGNOSIS — N3941 Urge incontinence: Secondary | ICD-10-CM | POA: Diagnosis not present

## 2022-06-16 DIAGNOSIS — M6281 Muscle weakness (generalized): Secondary | ICD-10-CM | POA: Diagnosis not present

## 2022-06-16 DIAGNOSIS — M62838 Other muscle spasm: Secondary | ICD-10-CM | POA: Diagnosis not present

## 2022-06-27 DIAGNOSIS — N3941 Urge incontinence: Secondary | ICD-10-CM | POA: Diagnosis not present

## 2022-06-27 DIAGNOSIS — M6289 Other specified disorders of muscle: Secondary | ICD-10-CM | POA: Diagnosis not present

## 2022-06-27 DIAGNOSIS — M6281 Muscle weakness (generalized): Secondary | ICD-10-CM | POA: Diagnosis not present

## 2022-06-27 DIAGNOSIS — M62838 Other muscle spasm: Secondary | ICD-10-CM | POA: Diagnosis not present

## 2022-07-01 ENCOUNTER — Other Ambulatory Visit: Payer: Self-pay | Admitting: Physician Assistant

## 2022-07-01 DIAGNOSIS — H6981 Other specified disorders of Eustachian tube, right ear: Secondary | ICD-10-CM

## 2022-07-01 DIAGNOSIS — J3089 Other allergic rhinitis: Secondary | ICD-10-CM

## 2022-07-08 DIAGNOSIS — M6289 Other specified disorders of muscle: Secondary | ICD-10-CM | POA: Diagnosis not present

## 2022-07-08 DIAGNOSIS — M62838 Other muscle spasm: Secondary | ICD-10-CM | POA: Diagnosis not present

## 2022-07-08 DIAGNOSIS — N3941 Urge incontinence: Secondary | ICD-10-CM | POA: Diagnosis not present

## 2022-07-08 DIAGNOSIS — M6281 Muscle weakness (generalized): Secondary | ICD-10-CM | POA: Diagnosis not present

## 2022-07-11 DIAGNOSIS — R350 Frequency of micturition: Secondary | ICD-10-CM | POA: Diagnosis not present

## 2022-07-11 DIAGNOSIS — R3915 Urgency of urination: Secondary | ICD-10-CM | POA: Diagnosis not present

## 2022-07-28 ENCOUNTER — Other Ambulatory Visit: Payer: Self-pay | Admitting: Physician Assistant

## 2022-07-28 DIAGNOSIS — E785 Hyperlipidemia, unspecified: Secondary | ICD-10-CM

## 2022-07-29 DIAGNOSIS — M6281 Muscle weakness (generalized): Secondary | ICD-10-CM | POA: Diagnosis not present

## 2022-07-29 DIAGNOSIS — M6289 Other specified disorders of muscle: Secondary | ICD-10-CM | POA: Diagnosis not present

## 2022-07-29 DIAGNOSIS — R3982 Chronic bladder pain: Secondary | ICD-10-CM | POA: Diagnosis not present

## 2022-07-29 DIAGNOSIS — R350 Frequency of micturition: Secondary | ICD-10-CM | POA: Diagnosis not present

## 2022-07-29 DIAGNOSIS — N3941 Urge incontinence: Secondary | ICD-10-CM | POA: Diagnosis not present

## 2022-08-29 DIAGNOSIS — M6289 Other specified disorders of muscle: Secondary | ICD-10-CM | POA: Diagnosis not present

## 2022-08-29 DIAGNOSIS — N3941 Urge incontinence: Secondary | ICD-10-CM | POA: Diagnosis not present

## 2022-08-29 DIAGNOSIS — M6281 Muscle weakness (generalized): Secondary | ICD-10-CM | POA: Diagnosis not present

## 2022-08-29 DIAGNOSIS — M62838 Other muscle spasm: Secondary | ICD-10-CM | POA: Diagnosis not present

## 2022-09-28 ENCOUNTER — Other Ambulatory Visit: Payer: Self-pay | Admitting: Physician Assistant

## 2022-09-28 DIAGNOSIS — Z1231 Encounter for screening mammogram for malignant neoplasm of breast: Secondary | ICD-10-CM

## 2022-10-28 ENCOUNTER — Ambulatory Visit
Admission: RE | Admit: 2022-10-28 | Discharge: 2022-10-28 | Disposition: A | Discharge status: Home / Self Care | Payer: Medicare PPO | Source: Ambulatory Visit

## 2022-10-28 VITALS — BP 123/83 | HR 76 | Temp 97.9°F | Resp 16

## 2022-10-28 DIAGNOSIS — J069 Acute upper respiratory infection, unspecified: Secondary | ICD-10-CM

## 2022-10-28 DIAGNOSIS — R0981 Nasal congestion: Secondary | ICD-10-CM

## 2022-10-28 DIAGNOSIS — J3089 Other allergic rhinitis: Secondary | ICD-10-CM

## 2022-10-28 MED ORDER — LORATADINE 10 MG PO TABS: 10.0000 mg | ORAL_TABLET | Freq: Every day | ORAL | 0 refills | Status: DC

## 2022-10-28 MED ORDER — PREDNISONE 10 MG PO TABS: 10.0000 mg | ORAL_TABLET | Freq: Three times a day (TID) | ORAL | 0 refills | Status: DC

## 2022-10-28 NOTE — ED Provider Notes (Signed)
[facility]    CSN: [id number] Arrival date & time: 10/28/22  1201      History   Chief Complaint Chief Complaint  Patient presents with   Cough    Mucous and Chest congestion - Entered by patient    HPI Brooke Hansen is a 66 y.o. female.   66 year old female presents with sinus congestion.  Patient indicates for the past 9 days she has been having persistent sinus congestion with frontal and maxillary pressure, postnasal drip, rhinitis which is mainly clear.  Patient indicates she is also had some mild bilateral ear congestion.  She indicates she has been using her albuterol inhaler only as needed for shortness of breath, she is using her Flonase nasal spray and also taking some Allegra-D but these are not controlling the amount of production she is producing as far as upper respiratory.  She does indicate that she is remodeling her kitchen and that she is exposed to a lot of dust and paint fumes over the past 2 weeks.  She believes this may contribute to her symptoms.  She denies having fever, chills, body aches or pain.  And she relates that her production has been clear.   Cough Associated symptoms: rhinorrhea     Past Medical History:  Diagnosis Date   Allergy    Anemia    Asthma    History of nephrectomy    right - nonfunctioning kidney   Hyperlipidemia    RLS (restless legs syndrome)    Transfusion of blood during current hospitalization     Patient Active Problem List   Diagnosis Date Noted   Upper respiratory tract infection due to COVID-19 virus 11/28/2021   Personal history of noncompliance with medical treatment and regimen 10/31/2019   Prediabetes 05/07/2018   Stage III chronic kidney disease ([place]) 05/07/2018   Vitamin D insufficiency 05/07/2018   ETD (Eustachian tube dysfunction), right 04/30/2018   Chronic vertigo-  seen by ENT in past 04/30/2018   Primary osteoarthritis of right knee 04/30/2018   counseling for NSAID long-term use-   Mobic given to pt by Ortho for R knee OA 04/30/2018   Renal agenesis, unspecified-in 1970s patient had a "shriveled up disease kidney "; status post right nephrectomy  04/30/2018   Reactive airway disease that is not asthma-due to environmental allergens 03/06/2018   Environmental and seasonal allergies 03/06/2018   Chronic pain of right knee with instability 03/06/2018   Status post lateral meniscectomy of right knee 03/06/2018   Other instability, right knee 03/06/2018   Screening for cardiovascular condition 03/20/2017   Routine general medical examination at a health care facility 02/22/2016   Right-sided chest wall pain 10/05/2015   Pernicious anemia 10/20/2014   Dysplastic nevus of trunk 04/25/2013   Vaginitis, atrophic 04/07/2011   Hyperlipidemia 09/11/2007   Allergic rhinitis 09/11/2007    Past Surgical History:  Procedure Laterality Date   BREAST BIOPSY Left 05/23/2019   FIBROCYSTIC CHANGES.   CESAREAN SECTION  1986   NEPHRECTOMY Right 1975    OB History     Gravida  1   Para      Term      Preterm      AB      Living         SAB      IAB      Ectopic      Multiple      Live Births  Home Medications    Prior to Admission medications   Medication Sig Start Date End Date Taking? Authorizing Provider  GEMTESA 75 MG TABS Take 1 tablet by mouth daily. 10/17/22  Yes [provider]  loratadine (CLARITIN) 10 MG tablet Take 1 tablet (10 mg total) by mouth daily. 10/28/22  Yes Nyoka Lint, PA-C  predniSONE (DELTASONE) 10 MG tablet Take 1 tablet (10 mg total) by mouth in the morning, at noon, and at bedtime. 10/28/22  Yes Nyoka Lint, PA-C  albuterol (VENTOLIN HFA) 108 (90 Base) MCG/ACT inhaler INHALE 2 PUFFS INTO THE LUNGS EVERY 6 HOURS AS NEEDED FOR WHEEZING OR SHORTNESS OF BREATH. 08/10/21   Lorrene Reid, PA-C  atorvastatin (LIPITOR) 40 MG tablet TAKE 1 TABLET (40 MG TOTAL) BY MOUTH AT BEDTIME. 07/28/22   Abonza, Maritza, PA-C   cyanocobalamin (,VITAMIN B-12,) 1000 MCG/ML injection INJECT 1 ML INTO THE MUSCLE EVERY 30 DAYS. 05/30/22   Abonza, Maritza, PA-C  fluticasone (FLONASE) 50 MCG/ACT nasal spray PLACE 2 SPRAYS IN EACH NOSTRIL DAILY. 07/01/22   Abonza, Maritza, PA-C  MYRBETRIQ 50 MG TB24 tablet Take 50 mg by mouth daily. 05/13/22   [provider]    Family History Family History  Problem Relation Age of Onset   Alcohol abuse Other    Diabetes Other    Hyperlipidemia Other    Coronary artery disease Other    Hyperlipidemia Mother    Cancer Father        lung   Heart disease Father    Heart disease Maternal Grandfather    Colon cancer Neg Hx     Social History Social History   Tobacco Use   Smoking status: Former    Packs/day: 1.00    Years: 10.00    Total pack years: 10.00    Types: Cigarettes    Quit date: 11/15/1983    Years since quitting: 38.9   Smokeless tobacco: Never  Vaping Use   Vaping Use: Never used  Substance Use Topics   Alcohol use: Yes    Alcohol/week: 2.0 standard drinks of alcohol    Types: 2 Standard drinks or equivalent per week   Drug use: No     Allergies   Septra [sulfamethoxazole-trimethoprim] and Ampicillin   Review of Systems Review of Systems  HENT:  Positive for postnasal drip, rhinorrhea and sinus pressure.   Respiratory:  Positive for cough.      Physical Exam Triage Vital Signs ED Triage Vitals [10/28/22 1222]  Enc Vitals Group     BP (!) 144/78     Pulse Rate 76     Resp 16     Temp 97.9 F (36.6 C)     Temp src      SpO2 95 %     Weight      Height      Head Circumference      Peak Flow      Pain Score 0     Pain Loc      Pain Edu?      Excl. in [place]?    No data found.  Updated Vital Signs BP 123/83   Pulse 76   Temp 97.9 F (36.6 C)   Resp 16   SpO2 95%   Visual Acuity Right Eye Distance:   Left Eye Distance:   Bilateral Distance:    Right Eye Near:   Left Eye Near:    Bilateral Near:     Physical  Exam Constitutional:  Appearance: Normal appearance.  HENT:     Right Ear: Ear canal normal. Tympanic membrane is injected.     Left Ear: Ear canal normal. Tympanic membrane is injected.     Mouth/Throat:     Mouth: Mucous membranes are moist.     Pharynx: Oropharynx is clear.  Cardiovascular:     Rate and Rhythm: Normal rate and regular rhythm.     Heart sounds: Normal heart sounds.  Pulmonary:     Effort: Pulmonary effort is normal.     Breath sounds: Normal breath sounds and air entry. No wheezing, rhonchi or rales.  Lymphadenopathy:     Cervical: No cervical adenopathy.  Neurological:     Mental Status: She is alert.      UC Treatments / Results  Labs (all labs ordered are listed, but only abnormal results are displayed) Labs Reviewed - No data to display  EKG   Radiology No results found.  Procedures Procedures (including critical care time)  Medications Ordered in UC Medications - No data to display  Initial Impression / Assessment and Plan / UC Course  I have reviewed the triage vital signs and the nursing notes.  Pertinent labs & imaging results that were available during my care of the patient were reviewed by me and considered in my medical decision making (see chart for details).    Plan: 1.  Sinus congestion will be treated with the following: A.  Advised patient to continue Flonase nasal spray, 2 sprays each nostril once daily to help reduce congestion. B.  Prednisone 10 mg 3 times a day for 5 days only to help reduce the acute sinus inflammation. 2.  The acute upper respiratory infection will be treated with the following: A.  Patient advised to continue Flonase nasal spray, 2 sprays each nostril once a day to help control congestion. B.  Claritin 10 mg once daily to help control the congestion and drainage. 3.  Seasonal allergies will be treated with the following: A.  Claritin 10 mg once daily to help control allergy symptoms. B.  Patient  advised to limit her exposure to paint and dust fumes as this contributes to her symptoms and prolong the duration. 4.  Advised follow-up PCP or return to urgent care if symptoms fail to improve. Final Clinical Impressions(s) / UC Diagnoses   Final diagnoses:  Sinus congestion  Acute upper respiratory infection  Environmental and seasonal allergies     Discharge Instructions      Advised to continue using the Flonase nasal spray, 2 sprays each nostril once a day to help relieve sinus congestion and drainage. Advised to take Claritin 10 mg daily as a trial to help improve the nasal congestion and drainage. Advised take the prednisone 10 mg 3 times a day for 5 days only to help reduce the acute sinus and respiratory inflammation. Advised to follow-up PCP or return to urgent care as needed.    ED Prescriptions     Medication Sig Dispense Auth. Provider   predniSONE (DELTASONE) 10 MG tablet Take 1 tablet (10 mg total) by mouth in the morning, at noon, and at bedtime. 15 tablet Nyoka Lint, PA-C   loratadine (CLARITIN) 10 MG tablet Take 1 tablet (10 mg total) by mouth daily. 10 tablet Nyoka Lint, PA-C      PDMP not reviewed this encounter.   Nyoka Lint, PA-C 10/28/22 1242

## 2022-10-28 NOTE — ED Triage Notes (Signed)
Pt is present today with c/o cough and chest congestion  Onset~x9 days

## 2022-10-28 NOTE — Discharge Instructions (Signed)
Advised to continue using the Flonase nasal spray, 2 sprays each nostril once a day to help relieve sinus congestion and drainage. Advised to take Claritin 10 mg daily as a trial to help improve the nasal congestion and drainage. Advised take the prednisone 10 mg 3 times a day for 5 days only to help reduce the acute sinus and respiratory inflammation. Advised to follow-up PCP or return to urgent care as needed.

## 2022-10-31 ENCOUNTER — Other Ambulatory Visit: Payer: Self-pay | Admitting: Nurse Practitioner

## 2022-10-31 DIAGNOSIS — E785 Hyperlipidemia, unspecified: Secondary | ICD-10-CM

## 2022-11-18 ENCOUNTER — Ambulatory Visit
Admission: RE | Admit: 2022-11-18 | Discharge: 2022-11-18 | Disposition: A | Discharge status: Home / Self Care | Payer: Medicare PPO | Source: Ambulatory Visit | Attending: Physician Assistant | Admitting: Physician Assistant

## 2022-11-18 DIAGNOSIS — Z1231 Encounter for screening mammogram for malignant neoplasm of breast: Secondary | ICD-10-CM

## 2022-12-05 ENCOUNTER — Other Ambulatory Visit: Payer: Self-pay | Admitting: Nurse Practitioner

## 2022-12-05 DIAGNOSIS — D510 Vitamin B12 deficiency anemia due to intrinsic factor deficiency: Secondary | ICD-10-CM

## 2023-01-28 ENCOUNTER — Other Ambulatory Visit: Payer: Self-pay | Admitting: Nurse Practitioner

## 2023-01-28 DIAGNOSIS — J3089 Other allergic rhinitis: Secondary | ICD-10-CM

## 2023-01-28 DIAGNOSIS — H6991 Unspecified Eustachian tube disorder, right ear: Secondary | ICD-10-CM

## 2023-03-14 ENCOUNTER — Telehealth: Payer: Self-pay

## 2023-03-14 NOTE — Telephone Encounter (Signed)
Contacted Brooke Hansen to schedule their annual wellness visit. Patient declined to schedule AWV at this time.  Pt states she works on Tuesday & Thursday and can not do a visit those days. I advised pt we would complete with PCP when she comes in July for CPE.  Brooke Hansen, CMA (AAMA)  [facility]- AWV Program [phone]

## 2023-04-28 ENCOUNTER — Other Ambulatory Visit: Payer: Self-pay | Admitting: Nurse Practitioner

## 2023-04-28 DIAGNOSIS — E785 Hyperlipidemia, unspecified: Secondary | ICD-10-CM

## 2023-05-19 ENCOUNTER — Other Ambulatory Visit: Payer: Self-pay

## 2023-05-19 DIAGNOSIS — R7303 Prediabetes: Secondary | ICD-10-CM

## 2023-05-19 DIAGNOSIS — Z130 Encounter for screening for diseases of the blood and blood-forming organs and certain disorders involving the immune mechanism: Secondary | ICD-10-CM

## 2023-05-19 DIAGNOSIS — N1831 Chronic kidney disease, stage 3a: Secondary | ICD-10-CM

## 2023-05-19 DIAGNOSIS — E785 Hyperlipidemia, unspecified: Secondary | ICD-10-CM

## 2023-05-19 DIAGNOSIS — Z0000 Encounter for general adult medical examination without abnormal findings: Secondary | ICD-10-CM

## 2023-05-29 ENCOUNTER — Other Ambulatory Visit: Payer: Self-pay | Admitting: Nurse Practitioner

## 2023-05-29 DIAGNOSIS — D510 Vitamin B12 deficiency anemia due to intrinsic factor deficiency: Secondary | ICD-10-CM

## 2023-06-02 ENCOUNTER — Other Ambulatory Visit: Payer: Medicare PPO

## 2023-06-02 DIAGNOSIS — Z130 Encounter for screening for diseases of the blood and blood-forming organs and certain disorders involving the immune mechanism: Secondary | ICD-10-CM

## 2023-06-02 DIAGNOSIS — N1831 Chronic kidney disease, stage 3a: Secondary | ICD-10-CM

## 2023-06-02 DIAGNOSIS — R7303 Prediabetes: Secondary | ICD-10-CM

## 2023-06-02 DIAGNOSIS — E785 Hyperlipidemia, unspecified: Secondary | ICD-10-CM

## 2023-06-02 DIAGNOSIS — Z0000 Encounter for general adult medical examination without abnormal findings: Secondary | ICD-10-CM

## 2023-06-03 LAB — CBC WITH DIFFERENTIAL/PLATELET
Basophils Absolute: 0 10*3/uL (ref 0.0–0.2)
Basos: 1 %
EOS (ABSOLUTE): 0.1 10*3/uL (ref 0.0–0.4)
Eos: 3 %
Hematocrit: 39.9 % (ref 34.0–46.6)
Hemoglobin: 12.6 g/dL (ref 11.1–15.9)
Immature Grans (Abs): 0 10*3/uL (ref 0.0–0.1)
Immature Granulocytes: 0 %
Lymphocytes Absolute: 1.5 10*3/uL (ref 0.7–3.1)
Lymphs: 31 %
MCH: 28.7 pg (ref 26.6–33.0)
MCHC: 31.6 g/dL (ref 31.5–35.7)
MCV: 91 fL (ref 79–97)
Monocytes Absolute: 0.6 10*3/uL (ref 0.1–0.9)
Monocytes: 12 %
Neutrophils Absolute: 2.6 10*3/uL (ref 1.4–7.0)
Neutrophils: 53 %
Platelets: 231 10*3/uL (ref 150–450)
RBC: 4.39 x10E6/uL (ref 3.77–5.28)
RDW: 13.2 % (ref 11.7–15.4)
WBC: 4.8 10*3/uL (ref 3.4–10.8)

## 2023-06-03 LAB — COMPREHENSIVE METABOLIC PANEL
ALT: 13 IU/L (ref 0–32)
AST: 23 IU/L (ref 0–40)
Albumin: 4.3 g/dL (ref 3.9–4.9)
Alkaline Phosphatase: 101 IU/L (ref 44–121)
BUN/Creatinine Ratio: 22 (ref 12–28)
BUN: 23 mg/dL (ref 8–27)
Bilirubin Total: 0.4 mg/dL (ref 0.0–1.2)
CO2: 25 mmol/L (ref 20–29)
Calcium: 9.9 mg/dL (ref 8.7–10.3)
Chloride: 100 mmol/L (ref 96–106)
Creatinine, Ser: 1.03 mg/dL — ABNORMAL HIGH (ref 0.57–1.00)
Globulin, Total: 2.3 g/dL (ref 1.5–4.5)
Glucose: 93 mg/dL (ref 70–99)
Potassium: 4.5 mmol/L (ref 3.5–5.2)
Sodium: 140 mmol/L (ref 134–144)
Total Protein: 6.6 g/dL (ref 6.0–8.5)
eGFR: 59 mL/min/{1.73_m2} — ABNORMAL LOW (ref >59)

## 2023-06-03 LAB — LIPID PANEL
Chol/HDL Ratio: 2.3 ratio (ref 0.0–4.4)
Cholesterol, Total: 177 mg/dL (ref 100–199)
HDL: 76 mg/dL (ref >39)
LDL Chol Calc (NIH): 91 mg/dL (ref 0–99)
Triglycerides: 51 mg/dL (ref 0–149)
VLDL Cholesterol Cal: 10 mg/dL (ref 5–40)

## 2023-06-03 LAB — HEMOGLOBIN A1C
Est. average glucose Bld gHb Est-mCnc: 131 mg/dL
Hgb A1c MFr Bld: 6.2 % — ABNORMAL HIGH (ref 4.8–5.6)

## 2023-06-03 LAB — TSH: TSH: 2.37 u[IU]/mL (ref 0.450–4.500)

## 2023-06-09 ENCOUNTER — Ambulatory Visit (INDEPENDENT_AMBULATORY_CARE_PROVIDER_SITE_OTHER): Payer: Medicare PPO | Admitting: Family Medicine

## 2023-06-09 ENCOUNTER — Encounter: Payer: Self-pay | Admitting: Family Medicine

## 2023-06-09 VITALS — BP 110/73 | HR 74 | Ht 64.0 in | Wt 143.8 lb

## 2023-06-09 DIAGNOSIS — D510 Vitamin B12 deficiency anemia due to intrinsic factor deficiency: Secondary | ICD-10-CM

## 2023-06-09 DIAGNOSIS — Z1159 Encounter for screening for other viral diseases: Secondary | ICD-10-CM | POA: Diagnosis not present

## 2023-06-09 DIAGNOSIS — E785 Hyperlipidemia, unspecified: Secondary | ICD-10-CM

## 2023-06-09 DIAGNOSIS — Q602 Renal agenesis, unspecified: Secondary | ICD-10-CM

## 2023-06-09 DIAGNOSIS — E559 Vitamin D deficiency, unspecified: Secondary | ICD-10-CM

## 2023-06-09 DIAGNOSIS — Z0000 Encounter for general adult medical examination without abnormal findings: Secondary | ICD-10-CM

## 2023-06-09 MED ORDER — ATORVASTATIN CALCIUM 40 MG PO TABS: 40.0000 mg | ORAL_TABLET | Freq: Every day | ORAL | 1 refills | Status: DC

## 2023-06-09 NOTE — Progress Notes (Signed)
Complete physical exam  Patient: Brooke Hansen   DOB: 09/07/1956   67 y.o. Female  MRN: [id number]  Subjective:    Chief Complaint  Patient presents with   Annual Exam    Brooke Hansen is a 67 y.o. female who presents today for a complete physical exam. She reports consuming a general diet, but would like to discuss how much protein she needs. Home exercise routine includes cardio and weightlifting. She generally feels well. She reports sleeping well.  She has noticed some pain when she is doing biceps curls and believes that she has tennis elbow it is curious what the best thing to do for it is.   Most recent fall risk assessment:    06/09/2023    9:15 AM  Fall Risk   Falls in the past year? 0  Number falls in past yr: 0  Injury with Fall? 0  Risk for fall due to : No Fall Risks  Follow up Falls evaluation completed     Most recent depression and anxiety screenings:    06/09/2023    9:16 AM 06/02/2022   10:29 AM  PHQ 2/9 Scores  PHQ - 2 Score 0 0  PHQ- 9 Score 0 0      06/09/2023    9:16 AM 06/02/2022   10:29 AM 08/13/2021    8:23 AM 05/11/2021    1:53 PM  GAD 7 : Generalized Anxiety Score  Nervous, Anxious, on Edge 0 0 0 0  Control/stop worrying 0 0 0 0  Worry too much - different things 0 0 0 0  Trouble relaxing 0 0 0 0  Restless 0 0 0 0  Easily annoyed or irritable 0 0 0 0  Afraid - awful might happen 0 0 0 0  Total GAD 7 Score 0 0 0 0  Anxiety Difficulty  Not difficult at all      Patient Active Problem List   Diagnosis Date Noted   Prediabetes 05/07/2018   Stage III chronic kidney disease ([facility]) 05/07/2018   Vitamin D insufficiency 05/07/2018   ETD (Eustachian tube dysfunction), right 04/30/2018   Chronic vertigo-  seen by ENT in past 04/30/2018   Primary osteoarthritis of right knee 04/30/2018   Renal agenesis, unspecified-in 1970s patient had a "shriveled up disease kidney "; status post right nephrectomy  04/30/2018   Reactive airway disease that  is not asthma-due to environmental allergens 03/06/2018   Environmental and seasonal allergies 03/06/2018   Chronic pain of right knee with instability 03/06/2018   Status post lateral meniscectomy of right knee 03/06/2018   Other instability, right knee 03/06/2018   Pernicious anemia 10/20/2014   Dysplastic nevus of trunk 04/25/2013   Vaginitis, atrophic 04/07/2011   Hyperlipidemia 09/11/2007   Allergic rhinitis 09/11/2007    Past Surgical History:  Procedure Laterality Date   BREAST BIOPSY Left 05/23/2019   FIBROCYSTIC CHANGES.   CESAREAN SECTION  1986   NEPHRECTOMY Right 1975   Social History   Tobacco Use   Smoking status: Former    Current packs/day: 0.00    Average packs/day: 1 pack/day for 10.0 years (10.0 ttl pk-yrs)    Types: Cigarettes    Start date: 11/14/1973    Quit date: 11/15/1983    Years since quitting: 39.5   Smokeless tobacco: Never  Vaping Use   Vaping status: Never Used  Substance Use Topics   Alcohol use: Yes    Alcohol/week: 2.0 standard drinks of alcohol  Types: 2 Standard drinks or equivalent per week   Drug use: No   Family History  Problem Relation Age of Onset   Alcohol abuse Other    Diabetes Other    Hyperlipidemia Other    Coronary artery disease Other    Hyperlipidemia Mother    Cancer Father        lung   Heart disease Father    Heart disease Maternal Grandfather    Colon cancer Neg Hx    Allergies  Allergen Reactions   Septra [Sulfamethoxazole-Trimethoprim] Other (See Comments)    Per pt: unknown (as teenager)   Ampicillin Rash     Patient Care Team: Melida Quitter, PA as PCP - General (Family Medicine) Maris Berger, MD as Consulting Physician (Ophthalmology)   Outpatient Medications Prior to Visit  Medication Sig   albuterol (VENTOLIN HFA) 108 (90 Base) MCG/ACT inhaler INHALE 2 PUFFS INTO THE LUNGS EVERY 6 HOURS AS NEEDED FOR WHEEZING OR SHORTNESS OF BREATH.   cyanocobalamin (VITAMIN B12) 1000 MCG/ML injection  INJECT 1 ML INTO THE MUSCLE EVERY 30 DAYS.   fluticasone (FLONASE) 50 MCG/ACT nasal spray PLACE 2 SPRAYS IN EACH NOSTRIL DAILY.   GEMTESA 75 MG TABS Take 1 tablet by mouth daily.   [DISCONTINUED] atorvastatin (LIPITOR) 40 MG tablet TAKE 1 TABLET (40 MG TOTAL) BY MOUTH AT BEDTIME.   [DISCONTINUED] loratadine (CLARITIN) 10 MG tablet Take 1 tablet (10 mg total) by mouth daily.   [DISCONTINUED] MYRBETRIQ 50 MG TB24 tablet Take 50 mg by mouth daily.   [DISCONTINUED] predniSONE (DELTASONE) 10 MG tablet Take 1 tablet (10 mg total) by mouth in the morning, at noon, and at bedtime.   No facility-administered medications prior to visit.    Review of Systems  Constitutional:  Negative for chills, fever and malaise/fatigue.  HENT:  Negative for congestion and hearing loss.   Eyes:  Negative for blurred vision and double vision.  Respiratory:  Negative for cough and shortness of breath.   Cardiovascular:  Negative for chest pain, palpitations and leg swelling.  Gastrointestinal:  Negative for abdominal pain, constipation, diarrhea and heartburn.  Genitourinary:  Negative for frequency and urgency.  Musculoskeletal:  Negative for myalgias and neck pain.  Neurological:  Negative for headaches.  Endo/Heme/Allergies:  Negative for polydipsia.  Psychiatric/Behavioral:  Negative for depression. The patient is not nervous/anxious and does not have insomnia.       Objective:    BP 110/73   Pulse 74   Ht 5\' 4"  (1.626 m)   Wt 143 lb 12 oz (65.2 kg)   SpO2 97%   BMI 24.67 kg/m    Physical Exam Constitutional:      General: She is not in acute distress.    Appearance: Normal appearance.  HENT:     Head: Normocephalic and atraumatic.     Right Ear: Tympanic membrane, ear canal and external ear normal. There is no impacted cerumen.     Left Ear: Tympanic membrane, ear canal and external ear normal. There is impacted cerumen.     Nose: Nose normal.     Mouth/Throat:     Mouth: Mucous membranes are  moist.     Pharynx: No oropharyngeal exudate or posterior oropharyngeal erythema.  Eyes:     Extraocular Movements: Extraocular movements intact.     Conjunctiva/sclera: Conjunctivae normal.     Pupils: Pupils are equal, round, and reactive to light.     Comments: Contacts prescription up-to-date  Neck:     Thyroid:  No thyroid mass, thyromegaly or thyroid tenderness.  Cardiovascular:     Rate and Rhythm: Normal rate and regular rhythm.     Heart sounds: Normal heart sounds. No murmur heard.    No friction rub. No gallop.  Pulmonary:     Effort: Pulmonary effort is normal. No respiratory distress.     Breath sounds: Normal breath sounds. No wheezing, rhonchi or rales.  Abdominal:     General: Abdomen is flat. Bowel sounds are normal. There is no distension.     Palpations: There is no mass.     Tenderness: There is no abdominal tenderness. There is no guarding.  Musculoskeletal:        General: Normal range of motion.     Cervical back: Normal range of motion and neck supple.  Lymphadenopathy:     Cervical: No cervical adenopathy.  Skin:    General: Skin is warm and dry.  Neurological:     Mental Status: She is alert and oriented to person, place, and time.     Cranial Nerves: No cranial nerve deficit.     Motor: No weakness.     Deep Tendon Reflexes: Reflexes normal.  Psychiatric:        Mood and Affect: Mood normal.        Assessment & Plan:    Routine Health Maintenance and Physical Exam  Immunization History  Administered Date(s) Administered   Fluad Quad(high Dose 65+) 08/13/2021   Influenza Split 08/21/2013   Influenza Whole 11/14/2006   Influenza, High Dose Seasonal PF 08/17/2019   Influenza-Unspecified 09/08/2014, 08/15/2015, 08/28/2020, 08/22/2022   Moderna SARS-COV2 Booster Vaccination 04/08/2021   PFIZER(Purple Top)SARS-COV-2 Vaccination 12/05/2019, 12/26/2019, 08/23/2020   PNEUMOCOCCAL CONJUGATE-20 05/13/2021   PPD Test 06/21/2013   Pneumococcal  Polysaccharide-23 11/04/2019   Td 11/14/2002   Tdap 09/09/2013   Zoster Recombinant(Shingrix) 03/20/2017, 05/29/2017   Zoster, Live 12/07/2015    Health Maintenance  Topic Date Due   Hepatitis C Screening  Never done   Medicare Annual Wellness (AWV)  05/04/2021   COVID-19 Vaccine (4 - 2023-24 season) 07/15/2022   INFLUENZA VACCINE  06/15/2023   DTaP/Tdap/Td (3 - Td or Tdap) 09/10/2023   MAMMOGRAM  11/18/2024   Colonoscopy  02/09/2025   Pneumonia Vaccine 19+ Years old  Completed   DEXA SCAN  Completed   Zoster Vaccines- Shingrix  Completed   HPV VACCINES  Aged Out   Reviewed most recent labs including CBC, CMP, lipid panel, A1c, TSH.  Within normal limits with the exception of creatinine being slightly elevated.  A1c is also slightly elevated but stable at 6.2.  Discussed health benefits of physical activity, and encouraged her to engage in regular exercise appropriate for her age and condition.  Wellness examination  Pernicious anemia -     Vitamin B12; Future  Vitamin D insufficiency -     VITAMIN D 25 Hydroxy (Vit-D Deficiency, Fractures); Future  Screening for viral disease -     Hepatitis C antibody; Future  Renal agenesis, unspecified-in 1970s patient had a "shriveled up disease kidney "; status post right nephrectomy  Assessment & Plan: We discussed protein intake should be about 15-20 g of protein at each meal.  She should not exceed this as recommendations for anyone with kidney disease is typically 0.8 g/kg/day.  We will continue to monitor renal function labs.   Hyperlipidemia, unspecified hyperlipidemia type -     Atorvastatin Calcium; Take 1 tablet (40 mg total) by mouth at bedtime.  Dispense: 90  tablet; Refill: 1  Vitamin D and vitamin B12 were not checked with annual labs, so checking today.  She is also agreeable to hepatitis C screening.  Return in about 1 year (around 06/08/2024) for annual physical, fasting blood work 1 week before.     Melida Quitter, PA

## 2023-06-09 NOTE — Patient Instructions (Signed)
Here is the type of brace to look for! If you search for "tennis elbow brace" there should be several options.    If you can schedule the Medicare Annual Wellness Visit, it usually takes about 30 minutes on the phone. We have them available on Tuesdays and Thursdays, so if you do ever have a day to do it just give [place] a call to schedule!

## 2023-06-09 NOTE — Assessment & Plan Note (Signed)
We discussed protein intake should be about 15-20 g of protein at each meal.  She should not exceed this as recommendations for anyone with kidney disease is typically 0.8 g/kg/day.  We will continue to monitor renal function labs.

## 2023-07-21 DIAGNOSIS — N3941 Urge incontinence: Secondary | ICD-10-CM | POA: Diagnosis not present

## 2023-07-28 DIAGNOSIS — R350 Frequency of micturition: Secondary | ICD-10-CM | POA: Diagnosis not present

## 2023-07-28 DIAGNOSIS — R3915 Urgency of urination: Secondary | ICD-10-CM | POA: Diagnosis not present

## 2023-08-04 DIAGNOSIS — R3915 Urgency of urination: Secondary | ICD-10-CM | POA: Diagnosis not present

## 2023-08-04 DIAGNOSIS — R350 Frequency of micturition: Secondary | ICD-10-CM | POA: Diagnosis not present

## 2023-08-11 ENCOUNTER — Other Ambulatory Visit (HOSPITAL_COMMUNITY): Payer: Self-pay

## 2023-08-11 DIAGNOSIS — R3915 Urgency of urination: Secondary | ICD-10-CM | POA: Diagnosis not present

## 2023-08-11 DIAGNOSIS — R350 Frequency of micturition: Secondary | ICD-10-CM | POA: Diagnosis not present

## 2023-08-11 MED ORDER — COVID-19 MRNA VAC-TRIS(PFIZER) 30 MCG/0.3ML IM SUSY
0.3000 mL | PREFILLED_SYRINGE | Freq: Once | INTRAMUSCULAR | 0 refills | Status: AC
  Filled 2023-08-11: qty 0.3, 1d supply, fill #0

## 2023-08-14 DIAGNOSIS — R3915 Urgency of urination: Secondary | ICD-10-CM | POA: Diagnosis not present

## 2023-08-14 DIAGNOSIS — R350 Frequency of micturition: Secondary | ICD-10-CM | POA: Diagnosis not present

## 2023-08-18 DIAGNOSIS — R3915 Urgency of urination: Secondary | ICD-10-CM | POA: Diagnosis not present

## 2023-08-18 DIAGNOSIS — R350 Frequency of micturition: Secondary | ICD-10-CM | POA: Diagnosis not present

## 2023-08-21 ENCOUNTER — Other Ambulatory Visit: Payer: Self-pay | Admitting: Family Medicine

## 2023-08-21 DIAGNOSIS — J3089 Other allergic rhinitis: Secondary | ICD-10-CM

## 2023-08-21 DIAGNOSIS — H6991 Unspecified Eustachian tube disorder, right ear: Secondary | ICD-10-CM

## 2023-08-25 DIAGNOSIS — N3941 Urge incontinence: Secondary | ICD-10-CM | POA: Diagnosis not present

## 2023-08-29 DIAGNOSIS — N3941 Urge incontinence: Secondary | ICD-10-CM | POA: Diagnosis not present

## 2023-09-27 ENCOUNTER — Other Ambulatory Visit (HOSPITAL_COMMUNITY): Payer: Self-pay

## 2023-09-29 DIAGNOSIS — R350 Frequency of micturition: Secondary | ICD-10-CM | POA: Diagnosis not present

## 2023-09-29 DIAGNOSIS — R3915 Urgency of urination: Secondary | ICD-10-CM | POA: Diagnosis not present

## 2023-10-06 ENCOUNTER — Other Ambulatory Visit (HOSPITAL_COMMUNITY): Payer: Self-pay

## 2023-10-11 ENCOUNTER — Other Ambulatory Visit: Payer: Self-pay | Admitting: Neurology

## 2023-10-11 DIAGNOSIS — Z1231 Encounter for screening mammogram for malignant neoplasm of breast: Secondary | ICD-10-CM

## 2023-10-26 DIAGNOSIS — R3915 Urgency of urination: Secondary | ICD-10-CM | POA: Diagnosis not present

## 2023-10-26 DIAGNOSIS — R350 Frequency of micturition: Secondary | ICD-10-CM | POA: Diagnosis not present

## 2023-11-20 ENCOUNTER — Ambulatory Visit
Admission: RE | Admit: 2023-11-20 | Discharge: 2023-11-20 | Disposition: A | Discharge status: Home / Self Care | Payer: Medicare PPO | Source: Ambulatory Visit | Attending: Family Medicine | Admitting: Family Medicine

## 2023-11-20 DIAGNOSIS — Z1231 Encounter for screening mammogram for malignant neoplasm of breast: Secondary | ICD-10-CM

## 2023-11-24 DIAGNOSIS — R3915 Urgency of urination: Secondary | ICD-10-CM | POA: Diagnosis not present

## 2023-11-24 DIAGNOSIS — R350 Frequency of micturition: Secondary | ICD-10-CM | POA: Diagnosis not present

## 2023-12-11 ENCOUNTER — Other Ambulatory Visit: Payer: Self-pay | Admitting: Family Medicine

## 2023-12-11 DIAGNOSIS — D510 Vitamin B12 deficiency anemia due to intrinsic factor deficiency: Secondary | ICD-10-CM

## 2023-12-21 ENCOUNTER — Encounter: Payer: Self-pay | Admitting: Family Medicine

## 2023-12-22 DIAGNOSIS — R350 Frequency of micturition: Secondary | ICD-10-CM | POA: Diagnosis not present

## 2023-12-22 DIAGNOSIS — R3915 Urgency of urination: Secondary | ICD-10-CM | POA: Diagnosis not present

## 2024-01-19 DIAGNOSIS — R3915 Urgency of urination: Secondary | ICD-10-CM | POA: Diagnosis not present

## 2024-01-19 DIAGNOSIS — R350 Frequency of micturition: Secondary | ICD-10-CM | POA: Diagnosis not present

## 2024-01-25 ENCOUNTER — Other Ambulatory Visit: Payer: Self-pay | Admitting: Family Medicine

## 2024-01-25 DIAGNOSIS — E785 Hyperlipidemia, unspecified: Secondary | ICD-10-CM

## 2024-03-19 ENCOUNTER — Other Ambulatory Visit: Payer: Self-pay | Admitting: Family Medicine

## 2024-03-19 DIAGNOSIS — H6991 Unspecified Eustachian tube disorder, right ear: Secondary | ICD-10-CM

## 2024-03-19 DIAGNOSIS — J3089 Other allergic rhinitis: Secondary | ICD-10-CM

## 2024-04-29 ENCOUNTER — Telehealth: Payer: Self-pay | Admitting: *Deleted

## 2024-04-29 NOTE — Telephone Encounter (Signed)
 LVM for pt to call office to reschedule her labs and appointment with the new provider, I will also send a mychart message as well. Please assist her in getting these scheduled if she calls back.

## 2024-06-01 ENCOUNTER — Other Ambulatory Visit: Payer: Self-pay | Admitting: Family Medicine

## 2024-06-01 DIAGNOSIS — D510 Vitamin B12 deficiency anemia due to intrinsic factor deficiency: Secondary | ICD-10-CM

## 2024-06-03 ENCOUNTER — Other Ambulatory Visit: Payer: Medicare PPO

## 2024-06-04 ENCOUNTER — Other Ambulatory Visit: Payer: Self-pay | Admitting: Family Medicine

## 2024-06-04 DIAGNOSIS — D510 Vitamin B12 deficiency anemia due to intrinsic factor deficiency: Secondary | ICD-10-CM

## 2024-06-06 ENCOUNTER — Other Ambulatory Visit: Payer: Self-pay | Admitting: Family Medicine

## 2024-06-06 ENCOUNTER — Telehealth: Payer: Self-pay | Admitting: Family Medicine

## 2024-06-06 DIAGNOSIS — D510 Vitamin B12 deficiency anemia due to intrinsic factor deficiency: Secondary | ICD-10-CM

## 2024-06-06 NOTE — Telephone Encounter (Unsigned)
 Copied from CRM [id number]. Topic: Clinical - Medication Refill >> Jun 06, 2024  1:00 PM Berwyn MATSU wrote: Medication: cyanocobalamin  (VITAMIN B12) 1000 MCG/ML injection  Has the patient contacted their pharmacy? Yes (Agent: If no, request that the patient contact the pharmacy for the refill. If patient does not wish to contact the pharmacy document the reason why and proceed with request.) (Agent: If yes, when and what did the pharmacy advise?)  This is the patient's preferred pharmacy:  [facility] - [place], [place] - [address] Brooke Brooke Hansen Phone: [phone] Fax: [phone]  Is this the correct pharmacy for this prescription? Yes If no, delete pharmacy and type the correct one.   Has the prescription been filled recently? Yes  Is the patient out of the medication? Yes  Has the patient been seen for an appointment in the last year OR does the patient have an upcoming appointment? Yes  Can we respond through MyChart? Yes  Agent: Please be advised that Rx refills may take up to 3 business days. We ask that you follow-up with your pharmacy.

## 2024-06-06 NOTE — Telephone Encounter (Signed)
 Copied from CRM [id number]. Topic: Clinical - Medication Refill >> Jun 06, 2024  1:00 PM Berwyn MATSU wrote: Medication: cyanocobalamin  (VITAMIN B12) 1000 MCG/ML injection; patient is in limbo with doctors as old PCP left and she has not seen new PCP yet.   Has the patient contacted their pharmacy? Yes (Agent: If no, request that the patient contact the pharmacy for the refill. If patient does not wish to contact the pharmacy document the reason why and proceed with request.) (Agent: If yes, when and what did the pharmacy advise?)  This is the patient's preferred pharmacy:  [facility] - [place], [place] - [address] Brooke Hansen [place] [address] Phone: [phone] Fax: [phone]  Is this the correct pharmacy for this prescription? Yes If no, delete pharmacy and type the correct one.   Has the prescription been filled recently? Yes  Is the patient out of the medication? Yes  Has the patient been seen for an appointment in the last year OR does the patient have an upcoming appointment? Yes  Can we respond through MyChart? Yes  Agent: Please be advised that Rx refills may take up to 3 business days. We ask that you follow-up with your pharmacy.

## 2024-06-10 ENCOUNTER — Encounter: Payer: Medicare PPO | Admitting: Family Medicine

## 2024-06-14 ENCOUNTER — Ambulatory Visit: Payer: Self-pay | Admitting: Nurse Practitioner

## 2024-06-14 ENCOUNTER — Encounter: Payer: Self-pay | Admitting: Nurse Practitioner

## 2024-06-14 VITALS — BP 124/84 | HR 84 | Temp 97.8°F | Ht 64.0 in | Wt 148.4 lb

## 2024-06-14 DIAGNOSIS — N1831 Chronic kidney disease, stage 3a: Secondary | ICD-10-CM

## 2024-06-14 DIAGNOSIS — N3281 Overactive bladder: Secondary | ICD-10-CM | POA: Insufficient documentation

## 2024-06-14 DIAGNOSIS — J3089 Other allergic rhinitis: Secondary | ICD-10-CM | POA: Diagnosis not present

## 2024-06-14 DIAGNOSIS — E559 Vitamin D deficiency, unspecified: Secondary | ICD-10-CM

## 2024-06-14 DIAGNOSIS — D510 Vitamin B12 deficiency anemia due to intrinsic factor deficiency: Secondary | ICD-10-CM

## 2024-06-14 DIAGNOSIS — R7303 Prediabetes: Secondary | ICD-10-CM

## 2024-06-14 DIAGNOSIS — E785 Hyperlipidemia, unspecified: Secondary | ICD-10-CM

## 2024-06-14 DIAGNOSIS — N952 Postmenopausal atrophic vaginitis: Secondary | ICD-10-CM

## 2024-06-14 MED ORDER — CYANOCOBALAMIN 1000 MCG/ML IJ SOLN: INTRAMUSCULAR | 1 refills | Status: AC

## 2024-06-14 NOTE — Assessment & Plan Note (Signed)
 Advise to use OTC lubricant to help with moisture at this time. Avoiding decongestants also should help with this.

## 2024-06-14 NOTE — Assessment & Plan Note (Signed)
 Not currently on supplement other than MVI

## 2024-06-14 NOTE — Assessment & Plan Note (Signed)
 Followed by urology at this time.

## 2024-06-14 NOTE — Assessment & Plan Note (Signed)
Continues with lifestyle modifications.

## 2024-06-14 NOTE — Assessment & Plan Note (Signed)
 Continues on Vit B12 supplements, giving herself monthly injection. Missed julys dose.

## 2024-06-14 NOTE — Progress Notes (Signed)
 Careteam: Patient Care Team: Caro Harlene POUR, NP as PCP - General (Geriatric Medicine) Leslee Reusing, MD as Consulting Physician (Ophthalmology)  PLACE OF SERVICE:  [facility]  Advanced Directive information    Allergies  Allergen Reactions   Septra [Sulfamethoxazole-Trimethoprim] Other (See Comments)    Per pt: unknown (as teenager)   Ampicillin Rash    Chief Complaint  Patient presents with   Establish Care    New patient to establish care     HPI:  Discussed the use of AI scribe software for clinical note transcription with the patient, who gave verbal consent to proceed.  History of Present Illness Brooke Hansen is a 68 year old female who presents for an established care appointment.  She has a history of asthma with occasional chest tightness attributed to allergies, but no wheezing. She uses albuterol  as needed and takes Flonase  daily for nasal congestion. She also uses Allertec D at night for sinus headaches.  She takes Lipitor 40 mg daily at bedtime for high cholesterol. She has not had recent fasting blood work.  She receives monthly B12 injections for pernicious anemia, although she missed her July injection due to a scheduling issue with her previous provider. A friend administers the injections for her.  She has a history of overactive bladder and previously used Gemtesa but is currently undergoing PTNS therapy instead.   She uses Kenalog ointment for occasional vaginal itching, previously treated with Diflucan  for a yeast infection. She reports dryness in the vaginal area.  She has a history of a right nephrectomy performed in 1975 due to a diseased kidney. She also has a history of anemia, allergies, and chronic kidney disease.  She reports gaining five pounds over the past year despite working out five days a week and increasing strength training.     Review of Systems:  Review of Systems  Constitutional:  Negative for chills,  fever and weight loss.  HENT:  Negative for tinnitus.   Respiratory:  Negative for cough, sputum production and shortness of breath.   Cardiovascular:  Negative for chest pain, palpitations and leg swelling.  Gastrointestinal:  Negative for abdominal pain, constipation, diarrhea and heartburn.  Genitourinary:  Negative for dysuria, frequency and urgency.  Musculoskeletal:  Negative for back pain, falls, joint pain and myalgias.  Skin: Negative.   Neurological:  Negative for dizziness and headaches.  Psychiatric/Behavioral:  Negative for depression and memory loss. The patient does not have insomnia.     Past Medical History:  Diagnosis Date   Allergy    Anemia    Asthma    Cataract    History of nephrectomy    right - nonfunctioning kidney   Hyperlipidemia    RLS (restless legs syndrome)    Transfusion of blood during current hospitalization    Past Surgical History:  Procedure Laterality Date   BREAST BIOPSY Left 05/23/2019   FIBROCYSTIC CHANGES.   CESAREAN SECTION  1986   NEPHRECTOMY Right 1975   Social History:   reports that she quit smoking about 40 years ago. Her smoking use included cigarettes. She started smoking about 50 years ago. She has a 10 pack-year smoking history. She has never used smokeless tobacco. She reports current alcohol use of about 4.0 standard drinks of alcohol per week. She reports that she does not use drugs.  Family History  Problem Relation Age of Onset   Hyperlipidemia Mother    Mental illness Mother    Cancer Father  lung   Heart disease Father    Heart disease Maternal Grandfather    Alcohol abuse Other    Diabetes Other    Hyperlipidemia Other    Coronary artery disease Other    ADD / ADHD Brother    Colon cancer Neg Hx     Medications: Patient's Medications  New Prescriptions   No medications on file  Previous Medications   ALBUTEROL  (VENTOLIN  HFA) 108 (90 BASE) MCG/ACT INHALER    INHALE 2 PUFFS INTO THE LUNGS EVERY 6  HOURS AS NEEDED FOR WHEEZING OR SHORTNESS OF BREATH.   ATORVASTATIN  (LIPITOR) 40 MG TABLET    TAKE 1 TABLET (40 MG TOTAL) BY MOUTH AT BEDTIME.   FLUTICASONE  (FLONASE ) 50 MCG/ACT NASAL SPRAY    PLACE 2 SPRAYS IN EACH NOSTRIL DAILY.   TRIAMCINOLONE OINTMENT (KENALOG) 0.1 %    Apply 1 Application topically as needed.  Modified Medications   Modified Medication Previous Medication   CYANOCOBALAMIN  (VITAMIN B12) 1000 MCG/ML INJECTION cyanocobalamin  (VITAMIN B12) 1000 MCG/ML injection      INJECT 1 ML INTO THE MUSCLE EVERY 30 DAYS.    INJECT 1 ML INTO THE MUSCLE EVERY 30 DAYS.  Discontinued Medications   GEMTESA 75 MG TABS    Take 1 tablet by mouth daily.    Physical Exam:  Vitals:   06/14/24 0840  BP: 124/84  Pulse: 84  Temp: 97.8 F (36.6 C)  SpO2: 97%  Weight: 148 lb 6.4 oz (67.3 kg)  Height: 5' 4 (1.626 m)   Body mass index is 25.47 kg/m. Wt Readings from Last 3 Encounters:  06/14/24 148 lb 6.4 oz (67.3 kg)  06/09/23 143 lb 12 oz (65.2 kg)  06/02/22 138 lb (62.6 kg)    Physical Exam Constitutional:      General: She is not in acute distress.    Appearance: She is well-developed. She is not diaphoretic.  HENT:     Head: Normocephalic and atraumatic.     Mouth/Throat:     Pharynx: No oropharyngeal exudate.  Eyes:     Conjunctiva/sclera: Conjunctivae normal.     Pupils: Pupils are equal, round, and reactive to light.  Cardiovascular:     Rate and Rhythm: Normal rate and regular rhythm.     Heart sounds: Normal heart sounds.  Pulmonary:     Effort: Pulmonary effort is normal.     Breath sounds: Normal breath sounds.  Abdominal:     General: Bowel sounds are normal.     Palpations: Abdomen is soft.  Musculoskeletal:     Cervical back: Normal range of motion and neck supple.     Right lower leg: No edema.     Left lower leg: No edema.  Skin:    General: Skin is warm and dry.  Neurological:     Mental Status: She is alert.  Psychiatric:        Mood and Affect:  Mood normal.     Labs reviewed: Basic Metabolic Panel: No results for input(s): NA, K, CL, CO2, GLUCOSE, BUN, CREATININE, CALCIUM , MG, PHOS, TSH in the last 8760 hours. Liver Function Tests: No results for input(s): AST, ALT, ALKPHOS, BILITOT, PROT, ALBUMIN in the last 8760 hours. No results for input(s): LIPASE, AMYLASE in the last 8760 hours. No results for input(s): AMMONIA in the last 8760 hours. CBC: No results for input(s): WBC, NEUTROABS, HGB, HCT, MCV, PLT in the last 8760 hours. Lipid Panel: No results for input(s): CHOL, HDL, LDLCALC, TRIG, CHOLHDL, LDLDIRECT in  the last 8760 hours. TSH: No results for input(s): TSH in the last 8760 hours. A1C: Lab Results  Component Value Date   HGBA1C 6.2 (H) 06/02/2023     Assessment/Plan  Pernicious anemia Assessment & Plan: Continues on Vit B12 supplements, giving herself monthly injection. Missed julys dose.  Orders: -     CBC with Differential/Platelet -     Vitamin B12 -     Cyanocobalamin ; INJECT 1 ML INTO THE MUSCLE EVERY 30 DAYS.  Dispense: 10 mL; Refill: 1  Prediabetes Assessment & Plan: Continues with lifestyle modifications  Orders: -     Hemoglobin A1c  Hyperlipidemia, unspecified hyperlipidemia type Assessment & Plan: Continues on lipitor with lifestyle modifications  Orders: -     Lipid panel -     Comprehensive metabolic panel with GFR  Environmental and seasonal allergies Assessment & Plan: To take Loratadine  or cetrizine (generic for Claritin  or zyrtec) 10 mg by mouth daily for allergies. Advised against use of any with a decongestant due to side effects  To use netipot or nasal wash daily   Overactive bladder Assessment & Plan: Followed by urology at this time.    Stage 3a chronic kidney disease (HCC) Assessment & Plan: Chronic and stable Encourage proper hydration Follow metabolic panel Avoid nephrotoxic meds  (NSAIDS)   Vitamin D  insufficiency Assessment & Plan: Not currently on supplement other than MVI  Orders: -     VITAMIN D  25 Hydroxy (Vit-D Deficiency, Fractures)  Vaginitis, atrophic Assessment & Plan: Advise to use OTC lubricant to help with moisture at this time. Avoiding decongestants also should help with this.       Return in about 6 months (around 12/15/2024) for routine follow up.  Ismaeel Arvelo K. Caro BODILY [facility] [phone]

## 2024-06-14 NOTE — Assessment & Plan Note (Signed)
 Chronic and stable Encourage proper hydration Follow metabolic panel Avoid nephrotoxic meds (NSAIDS)

## 2024-06-14 NOTE — Assessment & Plan Note (Signed)
 Continues on lipitor with lifestyle modifications

## 2024-06-14 NOTE — Assessment & Plan Note (Signed)
 To take Loratadine  or cetrizine (generic for Claritin  or zyrtec) 10 mg by mouth daily for allergies. Advised against use of any with a decongestant due to side effects  To use netipot or nasal wash daily

## 2024-06-15 LAB — HEMOGLOBIN A1C
Hgb A1c MFr Bld: 6.3 % — ABNORMAL HIGH (ref <5.7)
Mean Plasma Glucose: 134 mg/dL
eAG (mmol/L): 7.4 mmol/L

## 2024-06-15 LAB — COMPREHENSIVE METABOLIC PANEL WITH GFR
AG Ratio: 1.7 (calc) (ref 1.0–2.5)
ALT: 14 U/L (ref 6–29)
AST: 20 U/L (ref 10–35)
Albumin: 4.4 g/dL (ref 3.6–5.1)
Alkaline phosphatase (APISO): 89 U/L (ref 37–153)
BUN/Creatinine Ratio: 24 (calc) — ABNORMAL HIGH (ref 6–22)
BUN: 25 mg/dL (ref 7–25)
CO2: 30 mmol/L (ref 20–32)
Calcium: 9.8 mg/dL (ref 8.6–10.4)
Chloride: 102 mmol/L (ref 98–110)
Creat: 1.06 mg/dL — ABNORMAL HIGH (ref 0.50–1.05)
Globulin: 2.6 g/dL (ref 1.9–3.7)
Glucose, Bld: 81 mg/dL (ref 65–99)
Potassium: 4.7 mmol/L (ref 3.5–5.3)
Sodium: 139 mmol/L (ref 135–146)
Total Bilirubin: 0.4 mg/dL (ref 0.2–1.2)
Total Protein: 7 g/dL (ref 6.1–8.1)
eGFR: 57 mL/min/1.73m2 — ABNORMAL LOW (ref >=60)

## 2024-06-15 LAB — CBC WITH DIFFERENTIAL/PLATELET
Absolute Lymphocytes: 1410 {cells}/uL (ref 850–3900)
Absolute Monocytes: 652 {cells}/uL (ref 200–950)
Basophils Absolute: 32 {cells}/uL (ref 0–200)
Basophils Relative: 0.6 %
Eosinophils Absolute: 148 {cells}/uL (ref 15–500)
Eosinophils Relative: 2.8 %
HCT: 39.4 % (ref 35.0–45.0)
Hemoglobin: 12.5 g/dL (ref 11.7–15.5)
MCH: 29.7 pg (ref 27.0–33.0)
MCHC: 31.7 g/dL — ABNORMAL LOW (ref 32.0–36.0)
MCV: 93.6 fL (ref 80.0–100.0)
MPV: 10.6 fL (ref 7.5–12.5)
Monocytes Relative: 12.3 %
Neutro Abs: 3058 {cells}/uL (ref 1500–7800)
Neutrophils Relative %: 57.7 %
Platelets: 249 Thousand/uL (ref 140–400)
RBC: 4.21 Million/uL (ref 3.80–5.10)
RDW: 13.3 % (ref 11.0–15.0)
Total Lymphocyte: 26.6 %
WBC: 5.3 Thousand/uL (ref 3.8–10.8)

## 2024-06-15 LAB — VITAMIN D 25 HYDROXY (VIT D DEFICIENCY, FRACTURES): Vit D, 25-Hydroxy: 44 ng/mL (ref 30–100)

## 2024-06-15 LAB — LIPID PANEL
Cholesterol: 200 mg/dL — ABNORMAL HIGH (ref <200)
HDL: 69 mg/dL (ref >=50)
LDL Cholesterol (Calc): 114 mg/dL — ABNORMAL HIGH
Non-HDL Cholesterol (Calc): 131 mg/dL — ABNORMAL HIGH (ref <130)
Total CHOL/HDL Ratio: 2.9 (calc) (ref <5.0)
Triglycerides: 73 mg/dL (ref <150)

## 2024-06-15 LAB — VITAMIN B12: Vitamin B-12: 283 pg/mL (ref 200–1100)

## 2024-06-18 ENCOUNTER — Ambulatory Visit: Payer: Self-pay | Admitting: Nurse Practitioner

## 2024-07-02 ENCOUNTER — Ambulatory Visit: Admitting: Nurse Practitioner

## 2024-07-09 ENCOUNTER — Encounter: Payer: Self-pay | Admitting: Nurse Practitioner

## 2024-07-09 ENCOUNTER — Ambulatory Visit (INDEPENDENT_AMBULATORY_CARE_PROVIDER_SITE_OTHER): Admitting: Nurse Practitioner

## 2024-07-09 DIAGNOSIS — E2839 Other primary ovarian failure: Secondary | ICD-10-CM | POA: Diagnosis not present

## 2024-07-09 DIAGNOSIS — Z0000 Encounter for general adult medical examination without abnormal findings: Secondary | ICD-10-CM

## 2024-07-09 NOTE — Patient Instructions (Signed)
  Ms. Glacken , Thank you for taking time to come for your Medicare Wellness Visit. I appreciate your ongoing commitment to your health goals. Please review the following plan we discussed and let me know if I can assist you in the future.   TDAP, covid at your local pharmacy and flu can be given at pharmacy or our office    This is a list of the screening recommended for you and due dates:  Health Maintenance  Topic Date Due   DTaP/Tdap/Td vaccine (3 - Td or Tdap) 09/10/2023   COVID-19 Vaccine (6 - Pfizer risk 2024-25 season) 02/08/2024   Flu Shot  06/14/2024   Colon Cancer Screening  02/09/2025   Medicare Annual Wellness Visit  07/09/2025   Mammogram  11/19/2025   Pneumococcal Vaccine for age over 63  Completed   DEXA scan (bone density measurement)  Completed   Hepatitis C Screening  Completed   Zoster (Shingles) Vaccine  Completed   HPV Vaccine  Aged Out   Meningitis B Vaccine  Aged Out

## 2024-07-09 NOTE — Progress Notes (Signed)
  This service is provided via telemedicine  No vital signs collected/recorded due to the encounter was a telemedicine visit.   Location of patient (ex: home, work):  Home   Patient consents to a telephone visit:  Yes  Location of the provider (ex: office, home):  Office [place]   Name of any referring provider:  na  Names of all persons participating in the telemedicine service and their role in the encounter:  Brooke Hansen, Patient, Donzell Beal, CMA, Harlene An, NP  Time spent on call:  7:11

## 2024-07-09 NOTE — Progress Notes (Signed)
 Subjective:   Brooke Hansen is a 68 y.o. female who presents for Medicare Annual (Subsequent) preventive examination.  Visit Complete: Virtual I connected with  [name] on 07/09/24 by a video and audio enabled telemedicine application and verified that I am speaking with the correct person using two identifiers.  Patient Location: Home  Provider Location: Office/Clinic  I discussed the limitations of evaluation and management by telemedicine. The patient expressed understanding and agreed to proceed.  Vital Signs: Because this visit was a virtual/telehealth visit, some criteria may be missing or patient reported. Any vitals not documented were not able to be obtained and vitals that have been documented are patient reported.    Cardiac Risk Factors include: advanced age (>84men, >18 women);dyslipidemia     Objective:    There were no vitals filed for this visit. There is no height or weight on file to calculate BMI.     07/09/2024    9:35 AM 02/10/2015    9:43 AM 01/27/2015    3:32 PM  Advanced Directives  Does Patient Have a Medical Advance Directive? Yes No  No   Type of Estate agent of [place];Living will;Out of facility DNR (pink MOST or yellow form)    Does patient want to make changes to medical advance directive? No - Patient declined    Copy of Healthcare Power of Attorney in Chart? No - copy requested    Would patient like information on creating a medical advance directive?  No - patient declined information       Data saved with a previous flowsheet row definition    Current Medications (verified) Outpatient Encounter Medications as of 07/09/2024  Medication Sig   albuterol  (VENTOLIN  HFA) 108 (90 Base) MCG/ACT inhaler INHALE 2 PUFFS INTO THE LUNGS EVERY 6 HOURS AS NEEDED FOR WHEEZING OR SHORTNESS OF BREATH.   atorvastatin  (LIPITOR) 40 MG tablet TAKE 1 TABLET (40 MG TOTAL) BY MOUTH AT BEDTIME.   cyanocobalamin  (VITAMIN B12) 1000  MCG/ML injection INJECT 1 ML INTO THE MUSCLE EVERY 30 DAYS.   fluticasone  (FLONASE ) 50 MCG/ACT nasal spray PLACE 2 SPRAYS IN EACH NOSTRIL DAILY.   triamcinolone ointment (KENALOG) 0.1 % Apply 1 Application topically as needed.   No facility-administered encounter medications on file as of 07/09/2024.    Allergies (verified) Septra [sulfamethoxazole-trimethoprim] and Ampicillin   History: Past Medical History:  Diagnosis Date   Allergy    Anemia    Asthma    Cataract    History of nephrectomy    right - nonfunctioning kidney   Hyperlipidemia    RLS (restless legs syndrome)    Transfusion of blood during current hospitalization    Past Surgical History:  Procedure Laterality Date   BREAST BIOPSY Left 05/23/2019   FIBROCYSTIC CHANGES.   CESAREAN SECTION  1986   NEPHRECTOMY Right 1975   Family History  Problem Relation Age of Onset   Hyperlipidemia Mother    Mental illness Mother    Cancer Father        lung   Heart disease Father    Heart disease Maternal Grandfather    Alcohol abuse Other    Diabetes Other    Hyperlipidemia Other    Coronary artery disease Other    ADD / ADHD Brother    Colon cancer Neg Hx    Social History   Socioeconomic History   Marital status: Married    Spouse name: Not on file   Number of children: 1  Years of education: Not on file   Highest education level: Master's degree (e.g., MA, MS, MEng, MEd, MSW, MBA)  Occupational History   Not on file  Tobacco Use   Smoking status: Former    Current packs/day: 0.00    Average packs/day: 1 pack/day for 10.0 years (10.0 ttl pk-yrs)    Types: Cigarettes    Start date: 11/14/1973    Quit date: 11/15/1983    Years since quitting: 40.6   Smokeless tobacco: Never  Vaping Use   Vaping status: Never Used  Substance and Sexual Activity   Alcohol use: Yes    Alcohol/week: 4.0 standard drinks of alcohol    Types: 2 Glasses of wine, 2 Standard drinks or equivalent per week   Drug use: No   Sexual  activity: Not Currently    Birth control/protection: None  Other Topics Concern   Not on file  Social History Narrative   Not on file   Social Drivers of Health   Financial Resource Strain: Low Risk  (06/12/2024)   Overall Financial Resource Strain (CARDIA)    Difficulty of Paying Living Expenses: Not hard at all  Food Insecurity: No Food Insecurity (06/12/2024)   Hunger Vital Sign    Worried About Running Out of Food in the Last Year: Never true    Ran Out of Food in the Last Year: Never true  Transportation Needs: No Transportation Needs (06/12/2024)   PRAPARE - Administrator, Civil Service (Medical): No    Lack of Transportation (Non-Medical): No  Physical Activity: Sufficiently Active (06/12/2024)   Exercise Vital Sign    Days of Exercise per Week: 5 days    Minutes of Exercise per Session: 60 min  Stress: No Stress Concern Present (06/12/2024)   [employer] of Occupational Health - Occupational Stress Questionnaire    Feeling of Stress: Only a little  Social Connections: Socially Integrated (06/12/2024)   Social Connection and Isolation Panel    Frequency of Communication with Friends and Family: Never    Frequency of Social Gatherings with Friends and Family: More than three times a week    Attends Religious Services: More than 4 times per year    Active Member of [employer] or Organizations: Yes    Attends Engineer, structural: More than 4 times per year    Marital Status: Married    Tobacco Counseling Counseling given: Not Answered   Clinical Intake:  Pre-visit preparation completed: Yes  Pain : No/denies pain     BMI - recorded: 25 Nutritional Status: BMI 25 -29 Overweight Nutritional Risks: None  How often do you need to have someone help you when you read instructions, pamphlets, or other written materials from your doctor or pharmacy?: 1 - Never         Activities of Daily Living    07/09/2024    9:49 AM 07/08/2024    7:43 PM   In your present state of health, do you have any difficulty performing the following activities:  Hearing? 0 0  Vision? 0 0  Difficulty concentrating or making decisions? 0 0  Walking or climbing stairs? 0 0  Dressing or bathing? 0 0  Doing errands, shopping? 0 0  Preparing Food and eating ? N N  Using the Toilet? N N  In the past six months, have you accidently leaked urine? Y Y  Do you have problems with loss of bowel control? N N  Managing your Medications? N N  Managing  your Finances? N N  Housekeeping or managing your Housekeeping? N N    Patient Care Team: Caro Harlene POUR, NP as PCP - General (Geriatric Medicine) Leslee Reusing, MD as Consulting Physician (Ophthalmology)  Indicate any recent Medical Services you may have received from other than [facility] providers in the past year (date may be approximate).     Assessment:   This is a routine wellness examination for [place].  Hearing/Vision screen Vision Screening - Comments:: Dr. Leslee Last Exam: 03/2024   Goals Addressed   None    Depression Screen    06/14/2024    8:37 AM 06/09/2023    9:16 AM 06/02/2022   10:29 AM 11/23/2021   10:44 AM 08/13/2021    8:23 AM 05/11/2021    1:53 PM 11/05/2020    2:31 PM  PHQ 2/9 Scores  PHQ - 2 Score 0 0 0 0 0 0 0  PHQ- 9 Score  0 0  0 0 0    Fall Risk    07/09/2024    9:36 AM 07/08/2024    7:43 PM 06/14/2024    8:37 AM 06/09/2023    9:15 AM 06/02/2022   10:28 AM  Fall Risk   Falls in the past year? 0 0 0 0 1  Number falls in past yr: 0 0 0 0 0  Injury with Fall? 0  0 0 1  Risk for fall due to : No Fall Risks  No Fall Risks No Fall Risks History of fall(s)  Follow up Falls evaluation completed  Falls evaluation completed Falls evaluation completed Falls evaluation completed      Data saved with a previous flowsheet row definition    MEDICARE RISK AT HOME: Medicare Risk at Home Any stairs in or around the home?: Yes If so, are there any without handrails?: No Home  free of loose throw rugs in walkways, pet beds, electrical cords, etc?: Yes Adequate lighting in your home to reduce risk of falls?: Yes Life alert?: No Use of a cane, walker or w/c?: No Grab bars in the bathroom?: No Shower chair or bench in shower?: Yes Elevated toilet seat or a handicapped toilet?: Yes  TIMED UP AND GO:  Was the test performed?  No    Cognitive Function:        07/09/2024    9:36 AM 05/04/2020    1:14 PM  6CIT Screen  What Year? 0 points 0 points  What month? 0 points 0 points  What time? 0 points 0 points  Count back from 20 0 points 0 points  Months in reverse 0 points 0 points  Repeat phrase 0 points 2 points  Total Score 0 points 2 points    Immunizations Immunization History  Administered Date(s) Administered   Fluad Quad(high Dose 65+) 08/13/2021   INFLUENZA, HIGH DOSE SEASONAL PF 08/17/2019   Influenza Split 08/21/2013   Influenza Whole 11/14/2006   Influenza-Unspecified 09/08/2014, 08/15/2015, 08/28/2020, 08/22/2022, 09/13/2023   Moderna SARS-COV2 Booster Vaccination 04/08/2021   PFIZER(Purple Top)SARS-COV-2 Vaccination 12/05/2019, 12/26/2019, 08/23/2020   PNEUMOCOCCAL CONJUGATE-20 05/13/2021   PPD Test 06/21/2013   Pfizer(Comirnaty )Fall Seasonal Vaccine 12 years and older 08/11/2023   Pneumococcal Polysaccharide-23 11/04/2019   RSV,unspecified 09/30/2022   Td 11/14/2002   Tdap 09/09/2013   Unspecified SARS-COV-2 Vaccination 08/18/2022   Zoster Recombinant(Shingrix ) 03/20/2017, 05/29/2017   Zoster, Live 12/07/2015    TDAP status: Due, Education has been provided regarding the importance of this vaccine. Advised may receive this vaccine at local pharmacy  or Health Dept. Aware to provide a copy of the vaccination record if obtained from local pharmacy or Health Dept. Verbalized acceptance and understanding.  Flu Vaccine status: Due, Education has been provided regarding the importance of this vaccine. Advised may receive this vaccine at  local pharmacy or Health Dept. Aware to provide a copy of the vaccination record if obtained from local pharmacy or Health Dept. Verbalized acceptance and understanding.  Pneumococcal vaccine status: Up to date  Covid-19 vaccine status: Information provided on how to obtain vaccines.   Qualifies for Shingles Vaccine? Yes   Zostavax completed No   Shingrix  Completed?: Yes  Screening Tests Health Maintenance  Topic Date Due   DTaP/Tdap/Td (3 - Td or Tdap) 09/10/2023   COVID-19 Vaccine (6 - Pfizer risk 2024-25 season) 02/08/2024   INFLUENZA VACCINE  06/14/2024   Colonoscopy  02/09/2025   Medicare Annual Wellness (AWV)  07/09/2025   MAMMOGRAM  11/19/2025   Pneumococcal Vaccine: 50+ Years  Completed   DEXA SCAN  Completed   Hepatitis C Screening  Completed   Zoster Vaccines- Shingrix   Completed   HPV VACCINES  Aged Out   Meningococcal B Vaccine  Aged Out    Health Maintenance  Health Maintenance Due  Topic Date Due   DTaP/Tdap/Td (3 - Td or Tdap) 09/10/2023   COVID-19 Vaccine (6 - Pfizer risk 2024-25 season) 02/08/2024   INFLUENZA VACCINE  06/14/2024    Colorectal cancer screening: Type of screening: Colonoscopy. Completed 2016. Repeat every 10 years  Mammogram status: Completed 11/20/2023. Repeat every year  Bone Density status: Completed 12/13/2021. Results reflect: Bone density results: OSTEOPENIA. Repeat every 2 years.  Lung Cancer Screening: (Low Dose CT Chest recommended if Age 107-80 years, 20 pack-year currently smoking OR have quit w/in 15years.) does not qualify.   Lung Cancer Screening Referral: na  Additional Screening:  Hepatitis C Screening: does qualify; Completed   Vision Screening: Recommended annual ophthalmology exams for early detection of glaucoma and other disorders of the eye. Is the patient up to date with their annual eye exam?  Yes  Who is the provider or what is the name of the office in which the patient attends annual eye exams? [facility] If pt is not established with a provider, would they like to be referred to a provider to establish care? No .   Dental Screening: Recommended annual dental exams for proper oral hygiene   Community Resource Referral / Chronic Care Management: CRR required this visit?  No   CCM required this visit?  No     Plan:     I have personally reviewed and noted the following in the patient's chart:   Medical and social history Use of alcohol, tobacco or illicit drugs  Current medications and supplements including opioid prescriptions. Patient is not currently taking opioid prescriptions. Functional ability and status Nutritional status Physical activity Advanced directives List of other physicians Hospitalizations, surgeries, and ER visits in previous 12 months Vitals Screenings to include cognitive, depression, and falls Referrals and appointments  In addition, I have reviewed and discussed with patient certain preventive protocols, quality metrics, and best practice recommendations. A written personalized care plan for preventive services as well as general preventive health recommendations were provided to patient.     Harlene MARLA An, NP   07/09/2024   After Visit Summary: (MyChart) Due to this being a telephonic visit, the after visit summary with patients personalized plan was offered to patient via MyChart

## 2024-07-22 ENCOUNTER — Encounter: Payer: Self-pay | Admitting: Nurse Practitioner

## 2024-07-22 NOTE — Telephone Encounter (Signed)
 It looks like this referral has been process but I can not tell if anyone has reached out to her to schedule- do either of you know the number for her to call to schedule bone density?

## 2024-07-22 NOTE — Addendum Note (Signed)
 Addended by: LYNNANN COUGH A on: 07/22/2024 04:24 PM   Modules accepted: Orders

## 2024-08-09 ENCOUNTER — Other Ambulatory Visit (HOSPITAL_COMMUNITY): Payer: Self-pay

## 2024-08-09 MED ORDER — COVID-19 MRNA VAC-TRIS(PFIZER) 30 MCG/0.3ML IM SUSY
PREFILLED_SYRINGE | INTRAMUSCULAR | 0 refills | Status: DC
  Filled 2024-08-09: qty 0.3, 1d supply, fill #0

## 2024-08-29 ENCOUNTER — Ambulatory Visit (HOSPITAL_BASED_OUTPATIENT_CLINIC_OR_DEPARTMENT_OTHER)
Admission: RE | Admit: 2024-08-29 | Discharge: 2024-08-29 | Disposition: A | Discharge status: Home / Self Care | Source: Ambulatory Visit | Attending: Nurse Practitioner | Admitting: Nurse Practitioner

## 2024-08-29 ENCOUNTER — Ambulatory Visit: Payer: Self-pay | Admitting: Nurse Practitioner

## 2024-08-29 DIAGNOSIS — Z780 Asymptomatic menopausal state: Secondary | ICD-10-CM | POA: Diagnosis not present

## 2024-08-29 DIAGNOSIS — E2839 Other primary ovarian failure: Secondary | ICD-10-CM | POA: Insufficient documentation

## 2024-08-29 DIAGNOSIS — M8589 Other specified disorders of bone density and structure, multiple sites: Secondary | ICD-10-CM | POA: Diagnosis not present

## 2024-09-05 ENCOUNTER — Encounter: Payer: Self-pay | Admitting: Family Medicine

## 2024-09-05 ENCOUNTER — Ambulatory Visit: Admitting: Family Medicine

## 2024-09-05 VITALS — BP 121/78 | HR 84 | Ht 63.0 in | Wt 152.0 lb

## 2024-09-05 DIAGNOSIS — L900 Lichen sclerosus et atrophicus: Secondary | ICD-10-CM

## 2024-09-05 MED ORDER — CLOBETASOL PROPIONATE 0.05 % EX OINT: 1.0000 | TOPICAL_OINTMENT | Freq: Two times a day (BID) | CUTANEOUS | 1 refills | Status: AC

## 2024-09-05 NOTE — Progress Notes (Signed)
   Subjective:    Patient ID: Brooke Hansen is a 68 y.o. female presenting with Vaginal Itching (With some dryness, feels itching when in the shower, the hot water brings on the itching. Been going on for 6 months )  on 09/05/2024  HPI: Patient reports vaginal irritation for approx 6 months. Notes it is worse in the shower and at certain times. She has tried estrogen. Saw dermatology who put her on topical kenalog, which helped. Her PCP asked her to stop it and now it has worsened again. Also has urinary urge incontinence.  Review of Systems  Constitutional:  Negative for chills and fever.  Respiratory:  Negative for shortness of breath.   Cardiovascular:  Negative for chest pain.  Gastrointestinal:  Negative for abdominal pain, nausea and vomiting.  Genitourinary:  Negative for dysuria.  Skin:  Negative for rash.      Objective:    BP 121/78   Pulse 84   Ht 5' 3 (1.6 m)   Wt 152 lb (68.9 kg)   BMI 26.93 kg/m  Physical Exam Exam conducted with a chaperone present.  Constitutional:      General: She is not in acute distress.    Appearance: She is well-developed.  HENT:     Head: Normocephalic and atraumatic.  Eyes:     General: No scleral icterus. Cardiovascular:     Rate and Rhythm: Normal rate.  Pulmonary:     Effort: Pulmonary effort is normal.  Abdominal:     Palpations: Abdomen is soft.  Genitourinary:    Comments: Loss of architecture in labia minora. White plaques noted peri-anally, left labia minora and superiorly. See photo. Musculoskeletal:     Cervical back: Neck supple.  Skin:    General: Skin is warm and dry.  Neurological:     Mental Status: She is alert and oriented to person, place, and time.          Assessment & Plan:   Problem List Items Addressed This Visit       Unprioritized   Lichen sclerosus - Primary   Discussed lifelong need for treatment and surveillance and risk of vulvar cancer. Treatment is typically with high dose  steroid and should include 2x/day x 6 weeks, 1x/day x 6 weeks, qod x 6 weeks, then 2x/week. F/u in office for vulvar exam with photos in 3 months. Then q 6 month surveillance.       Relevant Medications   clobetasol ointment (TEMOVATE) 0.05 %     Return in about 3 months (around 12/06/2024) for a follow-up.  Glenys GORMAN Birk, MD 09/05/2024 9:17 AM

## 2024-09-05 NOTE — Progress Notes (Signed)
 Patient presents for Annual.  LMP: n/a Last pap: 5 years ago normal  Contraception: Post-menopausal Mammogram: Up to date: 11/20/2023 STD Screening: n/a Flu Vaccine : getting today  CC: vaginal itching and dryness

## 2024-09-05 NOTE — Assessment & Plan Note (Signed)
Discussed lifelong need for treatment and surveillance and risk of vulvar cancer. Treatment is typically with high dose steroid and should include 2x/day x 6 weeks, 1x/day x 6 weeks, qod x 6 weeks, then 2x/week. F/u in office for vulvar exam with photos in 3 months. Then q 6 month surveillance.

## 2024-09-05 NOTE — Patient Instructions (Signed)
 Discussed lifelong need for treatment and surveillance and risk of vulvar cancer. Treatment is typically with high dose steroid and should include 2x/day x 6 weeks, 1x/day x 6 weeks, qod x 6 weeks, then 2x/week.

## 2024-09-06 DIAGNOSIS — R350 Frequency of micturition: Secondary | ICD-10-CM | POA: Diagnosis not present

## 2024-10-04 DIAGNOSIS — R350 Frequency of micturition: Secondary | ICD-10-CM | POA: Diagnosis not present

## 2024-10-07 ENCOUNTER — Other Ambulatory Visit: Payer: Self-pay | Admitting: Nurse Practitioner

## 2024-10-07 DIAGNOSIS — Z1231 Encounter for screening mammogram for malignant neoplasm of breast: Secondary | ICD-10-CM

## 2024-10-14 ENCOUNTER — Other Ambulatory Visit: Payer: Self-pay | Admitting: Nurse Practitioner

## 2024-10-14 DIAGNOSIS — E785 Hyperlipidemia, unspecified: Secondary | ICD-10-CM

## 2024-10-15 DIAGNOSIS — N3941 Urge incontinence: Secondary | ICD-10-CM | POA: Diagnosis not present

## 2024-11-20 ENCOUNTER — Ambulatory Visit
Admission: RE | Admit: 2024-11-20 | Discharge: 2024-11-20 | Disposition: A | Discharge status: Home / Self Care | Source: Ambulatory Visit | Attending: Nurse Practitioner | Admitting: Nurse Practitioner

## 2024-11-20 DIAGNOSIS — Z1231 Encounter for screening mammogram for malignant neoplasm of breast: Secondary | ICD-10-CM

## 2024-11-22 ENCOUNTER — Ambulatory Visit: Payer: Self-pay | Admitting: Family Medicine

## 2024-12-06 ENCOUNTER — Ambulatory Visit: Admitting: Family Medicine

## 2024-12-06 VITALS — BP 131/81 | HR 80 | Wt 146.0 lb

## 2024-12-06 DIAGNOSIS — L900 Lichen sclerosus et atrophicus: Secondary | ICD-10-CM | POA: Diagnosis not present

## 2024-12-06 NOTE — Progress Notes (Signed)
" ° °  Subjective:    Patient ID: Brooke Hansen is a 69 y.o. female presenting with No chief complaint on file.  on 12/06/2024  HPI: Dx with LS. On clobetasol , and notes way less itching and improved symptoms.  Review of Systems  Constitutional:  Negative for chills and fever.  Respiratory:  Negative for shortness of breath.   Cardiovascular:  Negative for chest pain.  Gastrointestinal:  Negative for abdominal pain, nausea and vomiting.  Genitourinary:  Negative for dysuria.  Skin:  Negative for rash.      Objective:    BP 131/81   Pulse 80   Wt 146 lb (66.2 kg)   BMI 25.86 kg/m  Physical Exam Exam conducted with a chaperone present.  Constitutional:      General: She is not in acute distress.    Appearance: She is well-developed.  HENT:     Head: Normocephalic and atraumatic.  Eyes:     General: No scleral icterus. Cardiovascular:     Rate and Rhythm: Normal rate.  Pulmonary:     Effort: Pulmonary effort is normal.  Abdominal:     Palpations: Abdomen is soft.  Musculoskeletal:     Cervical back: Neck supple.  Skin:    General: Skin is warm and dry.  Neurological:     Mental Status: She is alert and oriented to person, place, and time.           Assessment & Plan:   Problem List Items Addressed This Visit       Unprioritized   Lichen sclerosus - Primary   Improved appearance and symptoms. Continue clobetasol  twice weekly.       Return in about 1 year (around 12/06/2025).  Brooke GORMAN Birk, MD 12/06/2024 11:26 AM   "

## 2024-12-06 NOTE — Assessment & Plan Note (Signed)
 Improved appearance and symptoms. Continue clobetasol  twice weekly.

## 2024-12-20 ENCOUNTER — Ambulatory Visit: Payer: Self-pay | Admitting: Nurse Practitioner

## 2024-12-20 ENCOUNTER — Encounter: Payer: Self-pay | Admitting: Nurse Practitioner

## 2024-12-20 VITALS — BP 110/78 | HR 85 | Temp 97.3°F | Ht 63.0 in | Wt 149.0 lb

## 2024-12-20 DIAGNOSIS — M8580 Other specified disorders of bone density and structure, unspecified site: Secondary | ICD-10-CM

## 2024-12-20 DIAGNOSIS — E785 Hyperlipidemia, unspecified: Secondary | ICD-10-CM

## 2024-12-20 DIAGNOSIS — M25561 Pain in right knee: Secondary | ICD-10-CM

## 2024-12-20 DIAGNOSIS — E559 Vitamin D deficiency, unspecified: Secondary | ICD-10-CM

## 2024-12-20 DIAGNOSIS — L900 Lichen sclerosus et atrophicus: Secondary | ICD-10-CM

## 2024-12-20 DIAGNOSIS — R7303 Prediabetes: Secondary | ICD-10-CM

## 2024-12-20 DIAGNOSIS — D510 Vitamin B12 deficiency anemia due to intrinsic factor deficiency: Secondary | ICD-10-CM

## 2024-12-20 DIAGNOSIS — H65411 Chronic allergic otitis media, right ear: Secondary | ICD-10-CM

## 2024-12-20 LAB — LIPID PANEL
Cholesterol: 153 mg/dL
HDL: 50 mg/dL
LDL Cholesterol (Calc): 87 mg/dL
Non-HDL Cholesterol (Calc): 103 mg/dL
Total CHOL/HDL Ratio: 3.1 (calc)
Triglycerides: 74 mg/dL

## 2024-12-20 LAB — COMPREHENSIVE METABOLIC PANEL WITH GFR
AG Ratio: 1.8 (calc) (ref 1.0–2.5)
ALT: 14 U/L (ref 6–29)
AST: 21 U/L (ref 10–35)
Albumin: 4.4 g/dL (ref 3.6–5.1)
Alkaline phosphatase (APISO): 81 U/L (ref 37–153)
BUN/Creatinine Ratio: 22 (calc) (ref 6–22)
BUN: 23 mg/dL (ref 7–25)
CO2: 30 mmol/L (ref 20–32)
Calcium: 9.7 mg/dL (ref 8.6–10.4)
Chloride: 103 mmol/L (ref 98–110)
Creat: 1.06 mg/dL — ABNORMAL HIGH (ref 0.60–1.00)
Globulin: 2.4 g/dL (ref 1.9–3.7)
Glucose, Bld: 91 mg/dL (ref 65–139)
Potassium: 4.5 mmol/L (ref 3.5–5.3)
Sodium: 140 mmol/L (ref 135–146)
Total Bilirubin: 0.4 mg/dL (ref 0.2–1.2)
Total Protein: 6.8 g/dL (ref 6.1–8.1)
eGFR: 57 mL/min/{1.73_m2} — ABNORMAL LOW

## 2024-12-20 LAB — CBC WITH DIFFERENTIAL/PLATELET
Absolute Lymphocytes: 1362 {cells}/uL (ref 850–3900)
Absolute Monocytes: 744 {cells}/uL (ref 200–950)
Basophils Absolute: 42 {cells}/uL (ref 0–200)
Basophils Relative: 0.7 %
Eosinophils Absolute: 288 {cells}/uL (ref 15–500)
Eosinophils Relative: 4.8 %
HCT: 40.3 % (ref 35.9–46.0)
Hemoglobin: 12.9 g/dL (ref 11.7–15.5)
MCH: 29.6 pg (ref 27.0–33.0)
MCHC: 32 g/dL (ref 31.6–35.4)
MCV: 92.4 fL (ref 81.4–101.7)
MPV: 11.2 fL (ref 7.5–12.5)
Monocytes Relative: 12.4 %
Neutro Abs: 3564 {cells}/uL (ref 1500–7800)
Neutrophils Relative %: 59.4 %
Platelets: 264 10*3/uL (ref 140–400)
RBC: 4.36 Million/uL (ref 3.80–5.10)
RDW: 13.2 % (ref 11.0–15.0)
Total Lymphocyte: 22.7 %
WBC: 6 10*3/uL (ref 3.8–10.8)

## 2024-12-20 NOTE — Patient Instructions (Addendum)
 Let us  know what dose of Vit D you are taking.   Change antihistamine to xyzal 5 mg daily

## 2024-12-20 NOTE — Progress Notes (Signed)
 "  Location:  [facility]  Provider: Caro Harlene POUR, NP  Goals of Care:     07/09/2024    9:35 AM  Advanced Directives  Does Patient Have a Medical Advance Directive? Yes  Type of Estate Agent of [place];Living will;Out of facility DNR (pink MOST or yellow form)  Does patient want to make changes to medical advance directive? No - Patient declined  Copy of Healthcare Power of Attorney in Chart? No - copy requested     Chief Complaint  Patient presents with   Medication management of chronic issues    6 month follow up.    HPI: Patient is a 69 y.o. female seen today for medical management of chronic diseases.    Pt 28-month follow up, notable hx for allergic rhinitis, hyperlipidemia, pernicious anemia, overactive bladder, and prediabetes.   Pt states sinus and ear congestion affecting rt side; had consult with ENT in the past due to tinnitus who discharged her with likely hx of viral infection and no follow up required; pt states symptoms have persisted and slightly worsened at times. States she is reliant on decongestants, takes Zyrtec D presently at night, with Flonase  for intermittent symptom relief. Reports at times with have flares with pounding of her head and pain in sinuses. No current exacerbations of symptoms but continues with persistent congestion   Pt states that she is up to date with B12 injections; was delayed during labs last visit d/t changing providers; denies fatigue, ShOB.  Pt endorses consistent exercise; states intermittent rt knee pain, pain rated at 7/10, no pain at rest; requests follow up care for rt knee as it limits mobility and performance. Endures ibuprofen mildly improves symptoms.   Pt states improvement in urinary symptoms with PTNS. Denies incontinence and concern for urination at this time.  Pt states less frequent BM; states dietary changes are likely the cause as weight loss is a goal. Denies OTC laxatives at this  time.  A&Ox3    Past Medical History:  Diagnosis Date   Allergy    Anemia    Asthma    Cataract    History of nephrectomy    right - nonfunctioning kidney   Hyperlipidemia    RLS (restless legs syndrome)    Transfusion of blood during current hospitalization     Past Surgical History:  Procedure Laterality Date   BREAST BIOPSY Left 05/23/2019   FIBROCYSTIC CHANGES.   CESAREAN SECTION  1986   NEPHRECTOMY Right 1975    Allergies[1]  Allergies as of 12/20/2024       Reactions   Septra [sulfamethoxazole-trimethoprim] Other (See Comments)   Per pt: unknown (as teenager)   Ampicillin Rash        Medication List        Accurate as of December 20, 2024  8:26 AM. If you have any questions, ask your nurse or doctor.          albuterol  108 (90 Base) MCG/ACT inhaler Commonly known as: VENTOLIN  HFA INHALE 2 PUFFS INTO THE LUNGS EVERY 6 HOURS AS NEEDED FOR WHEEZING OR SHORTNESS OF BREATH.   atorvastatin  40 MG tablet Commonly known as: LIPITOR TAKE 1 TABLET (40 MG TOTAL) BY MOUTH AT BEDTIME.   clobetasol  ointment 0.05 % Commonly known as: Temovate  Apply 1 Application topically 2 (two) times daily. 2x/day x 6 weeks, 1x/day x 6 weeks, qod x 6 weeks, then 2x/week.   cyanocobalamin  1000 MCG/ML injection Commonly known as: VITAMIN B12 INJECT 1  ML INTO THE MUSCLE EVERY 30 DAYS.   fluticasone  50 MCG/ACT nasal spray Commonly known as: FLONASE  PLACE 2 SPRAYS IN EACH NOSTRIL DAILY.        Review of Systems:  Review of Systems  Constitutional:  Negative for activity change, appetite change and fatigue.  HENT:  Positive for congestion, sinus pressure and tinnitus.   Eyes: Negative.   Respiratory:  Negative for chest tightness, shortness of breath and wheezing.   Cardiovascular:  Negative for chest pain.  Gastrointestinal:  Positive for constipation.  Endocrine: Negative.   Genitourinary: Negative.   Musculoskeletal: Negative.   Skin: Negative.    Allergic/Immunologic: Negative.   Neurological:  Negative for dizziness, weakness and numbness.  Hematological: Negative.   Psychiatric/Behavioral:  Negative for agitation and confusion.     Health Maintenance  Topic Date Due   Colonoscopy  02/09/2025   COVID-19 Vaccine (7 - Pfizer risk 2025-26 season) 02/06/2025   Medicare Annual Wellness (AWV)  07/09/2025   Mammogram  11/20/2026   DTaP/Tdap/Td (4 - Td or Tdap) 07/24/2034   Pneumococcal Vaccine: 50+ Years  Completed   Influenza Vaccine  Completed   Bone Density Scan  Completed   Hepatitis C Screening  Completed   Zoster Vaccines- Shingrix   Completed   Meningococcal B Vaccine  Aged Out    Physical Exam: Vitals:   12/20/24 0812  BP: 110/78  Pulse: 85  Temp: (!) 97.3 F (36.3 C)  SpO2: 96%  Weight: 149 lb (67.6 kg)  Height: 5' 3 (1.6 m)   Body mass index is 26.39 kg/m. Physical Exam Constitutional:      Appearance: Normal appearance.  HENT:     Right Ear: Ear canal and external ear normal.     Left Ear: Tympanic membrane, ear canal and external ear normal.     Ears:     Comments: Subtle TM bulging noted, consistent with fluid retention. Cardiovascular:     Rate and Rhythm: Normal rate and regular rhythm.     Pulses: Normal pulses.     Heart sounds: Normal heart sounds.  Pulmonary:     Effort: Pulmonary effort is normal.     Breath sounds: Normal breath sounds.  Abdominal:     General: Bowel sounds are normal.     Palpations: Abdomen is soft.  Musculoskeletal:     Comments: palpated rt posterior knee mass, lateral.  Skin:    General: Skin is warm and dry.     Capillary Refill: Capillary refill takes less than 2 seconds.  Neurological:     Mental Status: She is alert and oriented to person, place, and time. Mental status is at baseline.  Psychiatric:        Mood and Affect: Mood normal.        Behavior: Behavior normal.    Labs reviewed: Basic Metabolic Panel: Recent Labs    06/14/24 0931  NA 139   K 4.7  CL 102  CO2 30  GLUCOSE 81  BUN 25  CREATININE 1.06*  CALCIUM  9.8   Liver Function Tests: Recent Labs    06/14/24 0931  AST 20  ALT 14  BILITOT 0.4  PROT 7.0   No results for input(s): LIPASE, AMYLASE in the last 8760 hours. No results for input(s): AMMONIA in the last 8760 hours. CBC: Recent Labs    06/14/24 0931  WBC 5.3  NEUTROABS 3,058  HGB 12.5  HCT 39.4  MCV 93.6  PLT 249   Lipid Panel: Recent Labs  06/14/24 0931  CHOL 200*  HDL 69  LDLCALC 114*  TRIG 73  CHOLHDL 2.9   Lab Results  Component Value Date   HGBA1C 6.3 (H) 06/14/2024    Procedures since last visit: MM 3D SCREENING MAMMOGRAM BILATERAL BREAST Result Date: 11/22/2024 CLINICAL DATA:  Screening. EXAM: DIGITAL SCREENING BILATERAL MAMMOGRAM WITH TOMOSYNTHESIS AND CAD TECHNIQUE: Bilateral screening digital craniocaudal and mediolateral oblique mammograms were obtained. Bilateral screening digital breast tomosynthesis was performed. The images were evaluated with computer-aided detection. COMPARISON:  Previous exam(s). ACR Breast Density Category b: There are scattered areas of fibroglandular density. FINDINGS: There are no findings suspicious for malignancy. IMPRESSION: No mammographic evidence of malignancy. A result letter of this screening mammogram will be mailed directly to the patient. RECOMMENDATION: Screening mammogram in one year. (Code:SM-B-01Y) BI-RADS CATEGORY  1: Negative. Electronically Signed   By: Dina  Arceo M.D.   On: 11/22/2024 06:18    Assessment/Plan 1. Pernicious anemia - pt education on effects of low B12, continue current therapy - CBC with Differential/Platelet - Vitamin B12  2. Vitamin D  insufficiency - pt education on symptoms and risks of vit D deficiency - pt present therapy effective, advised to continue  3. Prediabetes (Primary) - pt education provided on risks of elevated A1c Continue diet and exercise.  - Hemoglobin A1c - Comprehensive  metabolic panel with GFR  4. Lichen sclerosus - pt well controlled on topical steroid, followed by OB/GYN.  5. Hyperlipidemia, unspecified hyperlipidemia type - pt educated on lifestyle modifications, endorses understanding and current exercise and dietary plan. - Lipid panel  6. Osteopenia, unspecified location - pt continuing on resistance training exercise program. - continue vit D supplementation.  7. Acute pain of right knee - referal to PT at this time -suspect bakers cyst to back of knee - DG Knee Complete 4 Views Right; Future - Ambulatory referral to Physical Therapy  8. Chronic allergic otitis media of right ear - pt education provided to consider switching to Xyzal from Zyrtec D for allergies - pt claims decongestant a necessity, advised to switch to OTC decongestant with Xyzal - pt education provided on switching and not combining therapies.  Encouraged to avoid decongestant but reports without medication symptoms worsen.  Continue with flonase  daily - Ambulatory referral to ENT for further evaluation and recommendation   Return in about 6 months (around 06/19/2025) for routine follow up.   Dale Bound, Student-FNP -I personally was present during the history, physical exam and medical decision-making activities of this service and have verified that the service and findings are accurately documented in the students note  Harlene K. Caro BODILY  [facility] [phone] 8 am - 5 pm) [phone] (after hours)     [1]  Allergies Allergen Reactions   Septra [Sulfamethoxazole-Trimethoprim] Other (See Comments)    Per pt: unknown (as teenager)   Ampicillin Rash   "

## 2025-06-27 ENCOUNTER — Ambulatory Visit: Admitting: Nurse Practitioner

## 2025-07-15 ENCOUNTER — Ambulatory Visit: Payer: Self-pay | Admitting: Nurse Practitioner
